# Patient Record
Sex: Male | Born: 1935 | Race: White | Hispanic: No | Smoking: Former smoker
Health system: Southern US, Community
[De-identification: ages and names within clinical notes are randomized; demographics above are authoritative.]

## PROBLEM LIST (undated history)

## (undated) DIAGNOSIS — Z89512 Acquired absence of left leg below knee: Secondary | ICD-10-CM

## (undated) DIAGNOSIS — N390 Urinary tract infection, site not specified: Principal | ICD-10-CM

## (undated) DIAGNOSIS — Z89511 Acquired absence of right leg below knee: Secondary | ICD-10-CM

## (undated) DIAGNOSIS — D649 Anemia, unspecified: Secondary | ICD-10-CM

## (undated) DIAGNOSIS — A419 Sepsis, unspecified organism: Secondary | ICD-10-CM

## (undated) DIAGNOSIS — J189 Pneumonia, unspecified organism: Secondary | ICD-10-CM

## (undated) DIAGNOSIS — E119 Type 2 diabetes mellitus without complications: Secondary | ICD-10-CM

## (undated) DIAGNOSIS — N184 Chronic kidney disease, stage 4 (severe): Secondary | ICD-10-CM

## (undated) DIAGNOSIS — E785 Hyperlipidemia, unspecified: Secondary | ICD-10-CM

## (undated) DIAGNOSIS — I10 Essential (primary) hypertension: Secondary | ICD-10-CM

## (undated) DIAGNOSIS — I472 Ventricular tachycardia: Secondary | ICD-10-CM

## (undated) DIAGNOSIS — N289 Disorder of kidney and ureter, unspecified: Secondary | ICD-10-CM

## (undated) DIAGNOSIS — J9601 Acute respiratory failure with hypoxia: Secondary | ICD-10-CM

## (undated) DIAGNOSIS — I4819 Other persistent atrial fibrillation: Secondary | ICD-10-CM

## (undated) DIAGNOSIS — I2510 Atherosclerotic heart disease of native coronary artery without angina pectoris: Secondary | ICD-10-CM

## (undated) DIAGNOSIS — N183 Chronic kidney disease, stage 3 (moderate): Secondary | ICD-10-CM

## (undated) DIAGNOSIS — N400 Enlarged prostate without lower urinary tract symptoms: Secondary | ICD-10-CM

## (undated) DIAGNOSIS — I5031 Acute diastolic (congestive) heart failure: Secondary | ICD-10-CM

## (undated) DIAGNOSIS — I480 Paroxysmal atrial fibrillation: Secondary | ICD-10-CM

## (undated) DIAGNOSIS — I4891 Unspecified atrial fibrillation: Secondary | ICD-10-CM

## (undated) HISTORY — PX: SHOULDER SURGERY: SHX246

## (undated) HISTORY — DX: Unspecified atrial fibrillation: I48.91

## (undated) HISTORY — DX: Acute diastolic (congestive) heart failure: I50.31

## (undated) HISTORY — DX: Acute respiratory failure with hypoxia: J96.01

## (undated) HISTORY — DX: Pneumonia, unspecified organism: J18.9

## (undated) HISTORY — DX: Ventricular tachycardia: I47.2

## (undated) HISTORY — DX: Paroxysmal atrial fibrillation: I48.0

## (undated) HISTORY — DX: Type 2 diabetes mellitus without complications: E11.9

## (undated) HISTORY — DX: Anemia, unspecified: D64.9

## (undated) HISTORY — PX: CORONARY ARTERY BYPASS GRAFT: SHX141

## (undated) HISTORY — DX: Acquired absence of right leg below knee: Z89.511

## (undated) HISTORY — DX: Sepsis, unspecified organism: A41.9

## (undated) HISTORY — DX: Urinary tract infection, site not specified: N39.0

## (undated) HISTORY — PX: BELOW KNEE LEG AMPUTATION: SUR23

## (undated) HISTORY — DX: Chronic kidney disease, stage 3 (moderate): N18.3

## (undated) HISTORY — DX: Chronic kidney disease, stage 4 (severe): N18.4

## (undated) HISTORY — PX: PROSTATE SURGERY: SHX751

## (undated) HISTORY — DX: Hyperlipidemia, unspecified: E78.5

## (undated) HISTORY — DX: Acquired absence of left leg below knee: Z89.512

## (undated) HISTORY — DX: Other persistent atrial fibrillation: I48.19

---

## 2015-11-30 DIAGNOSIS — Z89619 Acquired absence of unspecified leg above knee: Secondary | ICD-10-CM | POA: Insufficient documentation

## 2016-07-26 ENCOUNTER — Observation Stay (HOSPITAL_COMMUNITY): Payer: Medicare Other

## 2016-07-26 ENCOUNTER — Observation Stay (HOSPITAL_COMMUNITY)
Admission: EM | Admit: 2016-07-26 | Discharge: 2016-07-28 | Disposition: A | Discharge status: Home / Self Care | Payer: Medicare Other | Attending: Internal Medicine | Admitting: Internal Medicine

## 2016-07-26 ENCOUNTER — Encounter (HOSPITAL_COMMUNITY): Payer: Self-pay | Admitting: Emergency Medicine

## 2016-07-26 DIAGNOSIS — Z794 Long term (current) use of insulin: Secondary | ICD-10-CM | POA: Insufficient documentation

## 2016-07-26 DIAGNOSIS — Z89511 Acquired absence of right leg below knee: Secondary | ICD-10-CM

## 2016-07-26 DIAGNOSIS — E119 Type 2 diabetes mellitus without complications: Secondary | ICD-10-CM | POA: Diagnosis not present

## 2016-07-26 DIAGNOSIS — R0602 Shortness of breath: Secondary | ICD-10-CM

## 2016-07-26 DIAGNOSIS — Z89512 Acquired absence of left leg below knee: Secondary | ICD-10-CM | POA: Insufficient documentation

## 2016-07-26 DIAGNOSIS — R140 Abdominal distension (gaseous): Secondary | ICD-10-CM | POA: Diagnosis not present

## 2016-07-26 DIAGNOSIS — Z951 Presence of aortocoronary bypass graft: Secondary | ICD-10-CM | POA: Insufficient documentation

## 2016-07-26 DIAGNOSIS — I129 Hypertensive chronic kidney disease with stage 1 through stage 4 chronic kidney disease, or unspecified chronic kidney disease: Secondary | ICD-10-CM | POA: Insufficient documentation

## 2016-07-26 DIAGNOSIS — D649 Anemia, unspecified: Secondary | ICD-10-CM

## 2016-07-26 DIAGNOSIS — Z66 Do not resuscitate: Secondary | ICD-10-CM | POA: Diagnosis not present

## 2016-07-26 DIAGNOSIS — D631 Anemia in chronic kidney disease: Secondary | ICD-10-CM | POA: Insufficient documentation

## 2016-07-26 DIAGNOSIS — N400 Enlarged prostate without lower urinary tract symptoms: Secondary | ICD-10-CM | POA: Diagnosis not present

## 2016-07-26 DIAGNOSIS — N183 Chronic kidney disease, stage 3 unspecified: Secondary | ICD-10-CM

## 2016-07-26 DIAGNOSIS — E1122 Type 2 diabetes mellitus with diabetic chronic kidney disease: Secondary | ICD-10-CM | POA: Insufficient documentation

## 2016-07-26 DIAGNOSIS — I480 Paroxysmal atrial fibrillation: Secondary | ICD-10-CM | POA: Diagnosis not present

## 2016-07-26 DIAGNOSIS — I10 Essential (primary) hypertension: Secondary | ICD-10-CM

## 2016-07-26 DIAGNOSIS — I2510 Atherosclerotic heart disease of native coronary artery without angina pectoris: Secondary | ICD-10-CM

## 2016-07-26 DIAGNOSIS — N184 Chronic kidney disease, stage 4 (severe): Secondary | ICD-10-CM

## 2016-07-26 DIAGNOSIS — Z7982 Long term (current) use of aspirin: Secondary | ICD-10-CM | POA: Diagnosis not present

## 2016-07-26 DIAGNOSIS — D638 Anemia in other chronic diseases classified elsewhere: Secondary | ICD-10-CM

## 2016-07-26 DIAGNOSIS — R001 Bradycardia, unspecified: Secondary | ICD-10-CM | POA: Diagnosis not present

## 2016-07-26 DIAGNOSIS — I4891 Unspecified atrial fibrillation: Secondary | ICD-10-CM

## 2016-07-26 HISTORY — DX: Anemia, unspecified: D64.9

## 2016-07-26 HISTORY — DX: Acquired absence of right leg below knee: Z89.511

## 2016-07-26 HISTORY — DX: Acquired absence of left leg below knee: Z89.512

## 2016-07-26 HISTORY — DX: Essential (primary) hypertension: I10

## 2016-07-26 HISTORY — DX: Disorder of kidney and ureter, unspecified: N28.9

## 2016-07-26 HISTORY — DX: Type 2 diabetes mellitus without complications: E11.9

## 2016-07-26 HISTORY — DX: Atherosclerotic heart disease of native coronary artery without angina pectoris: I25.10

## 2016-07-26 HISTORY — DX: Benign prostatic hyperplasia without lower urinary tract symptoms: N40.0

## 2016-07-26 HISTORY — DX: Chronic kidney disease, stage 3 unspecified: N18.30

## 2016-07-26 LAB — CBC
HEMATOCRIT: 25.3 % — AB (ref 39.0–52.0)
HEMOGLOBIN: 8.6 g/dL — AB (ref 13.0–17.0)
MCH: 31.4 pg (ref 26.0–34.0)
MCHC: 34 g/dL (ref 30.0–36.0)
MCV: 92.3 fL (ref 78.0–100.0)
Platelets: 149 10*3/uL — ABNORMAL LOW (ref 150–400)
RBC: 2.74 MIL/uL — ABNORMAL LOW (ref 4.22–5.81)
RDW: 15.2 % (ref 11.5–15.5)
WBC: 6.2 10*3/uL (ref 4.0–10.5)

## 2016-07-26 LAB — COMPREHENSIVE METABOLIC PANEL
ALBUMIN: 3.8 g/dL (ref 3.5–5.0)
ALK PHOS: 98 U/L (ref 38–126)
ALT: 22 U/L (ref 17–63)
AST: 17 U/L (ref 15–41)
Anion gap: 8 (ref 5–15)
BILIRUBIN TOTAL: 0.6 mg/dL (ref 0.3–1.2)
BUN: 78 mg/dL — ABNORMAL HIGH (ref 6–20)
CALCIUM: 8.7 mg/dL — AB (ref 8.9–10.3)
CO2: 21 mmol/L — ABNORMAL LOW (ref 22–32)
Chloride: 106 mmol/L (ref 101–111)
Creatinine, Ser: 2.55 mg/dL — ABNORMAL HIGH (ref 0.61–1.24)
GFR calc Af Amer: 25 mL/min — ABNORMAL LOW (ref >60)
GFR, EST NON AFRICAN AMERICAN: 21 mL/min — AB (ref >60)
GLUCOSE: 189 mg/dL — AB (ref 65–99)
POTASSIUM: 4.4 mmol/L (ref 3.5–5.1)
Sodium: 135 mmol/L (ref 135–145)
TOTAL PROTEIN: 6.6 g/dL (ref 6.5–8.1)

## 2016-07-26 LAB — RETICULOCYTES
RBC.: 2.74 MIL/uL — ABNORMAL LOW (ref 4.22–5.81)
RETIC COUNT ABSOLUTE: 74 10*3/uL (ref 19.0–186.0)
Retic Ct Pct: 2.7 % (ref 0.4–3.1)

## 2016-07-26 LAB — POC OCCULT BLOOD, ED: FECAL OCCULT BLD: NEGATIVE

## 2016-07-26 LAB — ABO/RH: ABO/RH(D): O POS

## 2016-07-26 LAB — CBG MONITORING, ED: Glucose-Capillary: 171 mg/dL — ABNORMAL HIGH (ref 65–99)

## 2016-07-26 LAB — PREPARE RBC (CROSSMATCH)

## 2016-07-26 MED ORDER — INSULIN ASPART 100 UNIT/ML ~~LOC~~ SOLN
0.0000 [IU] | Freq: Three times a day (TID) | SUBCUTANEOUS | Status: DC
  Administered 2016-07-27: 2 [IU] via SUBCUTANEOUS
  Administered 2016-07-27: 7 [IU] via SUBCUTANEOUS
  Administered 2016-07-28: 2 [IU] via SUBCUTANEOUS
  Administered 2016-07-28: 1 [IU] via SUBCUTANEOUS

## 2016-07-26 MED ORDER — MINOXIDIL 2.5 MG PO TABS
2.5000 mg | ORAL_TABLET | Freq: Two times a day (BID) | ORAL | Status: DC
  Administered 2016-07-26 – 2016-07-28 (×4): 2.5 mg via ORAL
  Filled 2016-07-26 (×4): qty 1

## 2016-07-26 MED ORDER — DIPHENHYDRAMINE HCL 25 MG PO CAPS
25.0000 mg | ORAL_CAPSULE | Freq: Once | ORAL | Status: AC
  Administered 2016-07-26: 25 mg via ORAL
  Filled 2016-07-26: qty 1

## 2016-07-26 MED ORDER — PANTOPRAZOLE SODIUM 40 MG PO TBEC
40.0000 mg | DELAYED_RELEASE_TABLET | Freq: Every day | ORAL | Status: DC
  Administered 2016-07-26 – 2016-07-28 (×3): 40 mg via ORAL
  Filled 2016-07-26 (×3): qty 1

## 2016-07-26 MED ORDER — ONDANSETRON HCL 4 MG/2ML IJ SOLN: 4.0000 mg | Freq: Four times a day (QID) | INTRAMUSCULAR | Status: DC | PRN

## 2016-07-26 MED ORDER — INSULIN DETEMIR 100 UNIT/ML ~~LOC~~ SOLN
15.0000 [IU] | Freq: Two times a day (BID) | SUBCUTANEOUS | Status: DC
  Administered 2016-07-27 – 2016-07-28 (×3): 15 [IU] via SUBCUTANEOUS
  Filled 2016-07-26 (×5): qty 0.15

## 2016-07-26 MED ORDER — ALLOPURINOL 300 MG PO TABS
300.0000 mg | ORAL_TABLET | Freq: Every day | ORAL | Status: DC
  Administered 2016-07-27 – 2016-07-28 (×2): 300 mg via ORAL
  Filled 2016-07-26 (×2): qty 1

## 2016-07-26 MED ORDER — POLYETHYLENE GLYCOL 3350 17 G PO PACK: 17.0000 g | PACK | Freq: Every day | ORAL | Status: DC | PRN

## 2016-07-26 MED ORDER — SODIUM CHLORIDE 0.9 % IV SOLN: 10.0000 mL/h | Freq: Once | INTRAVENOUS | Status: AC

## 2016-07-26 MED ORDER — METOPROLOL TARTRATE 25 MG PO TABS
50.0000 mg | ORAL_TABLET | Freq: Two times a day (BID) | ORAL | Status: DC
  Administered 2016-07-26: 50 mg via ORAL
  Filled 2016-07-26: qty 2

## 2016-07-26 MED ORDER — ASPIRIN EC 81 MG PO TBEC
81.0000 mg | DELAYED_RELEASE_TABLET | Freq: Every day | ORAL | Status: DC
  Administered 2016-07-27 – 2016-07-28 (×2): 81 mg via ORAL
  Filled 2016-07-26 (×2): qty 1

## 2016-07-26 MED ORDER — ACETAMINOPHEN 325 MG PO TABS
650.0000 mg | ORAL_TABLET | Freq: Once | ORAL | Status: AC
Start: 2016-07-26 — End: 2016-07-26
  Filled 2016-07-26: qty 2

## 2016-07-26 MED ORDER — IPRATROPIUM-ALBUTEROL 0.5-2.5 (3) MG/3ML IN SOLN: 3.0000 mL | RESPIRATORY_TRACT | Status: DC | PRN

## 2016-07-26 MED ORDER — ATORVASTATIN CALCIUM 10 MG PO TABS
20.0000 mg | ORAL_TABLET | Freq: Every day | ORAL | Status: DC
  Administered 2016-07-26 – 2016-07-27 (×2): 20 mg via ORAL
  Filled 2016-07-26 (×2): qty 2

## 2016-07-26 MED ORDER — HYDRALAZINE HCL 20 MG/ML IJ SOLN
20.0000 mg | Freq: Four times a day (QID) | INTRAMUSCULAR | Status: DC | PRN
  Administered 2016-07-28: 20 mg via INTRAVENOUS
  Filled 2016-07-26: qty 1

## 2016-07-26 MED ORDER — ACETAMINOPHEN 650 MG RE SUPP: 650.0000 mg | Freq: Four times a day (QID) | RECTAL | Status: DC | PRN

## 2016-07-26 MED ORDER — SODIUM CHLORIDE 0.9% FLUSH
3.0000 mL | Freq: Two times a day (BID) | INTRAVENOUS | Status: DC
  Administered 2016-07-26 – 2016-07-27 (×4): 3 mL via INTRAVENOUS

## 2016-07-26 MED ORDER — FUROSEMIDE 10 MG/ML IJ SOLN
20.0000 mg | Freq: Once | INTRAMUSCULAR | Status: AC
  Administered 2016-07-26: 20 mg via INTRAVENOUS
  Filled 2016-07-26: qty 2

## 2016-07-26 MED ORDER — DOXYCYCLINE HYCLATE 100 MG PO TABS
100.0000 mg | ORAL_TABLET | Freq: Two times a day (BID) | ORAL | Status: DC
  Administered 2016-07-26: 100 mg via ORAL
  Filled 2016-07-26: qty 1

## 2016-07-26 MED ORDER — ONDANSETRON HCL 4 MG PO TABS: 4.0000 mg | ORAL_TABLET | Freq: Four times a day (QID) | ORAL | Status: DC | PRN

## 2016-07-26 MED ORDER — AMLODIPINE BESYLATE 5 MG PO TABS
10.0000 mg | ORAL_TABLET | Freq: Every day | ORAL | Status: DC
  Administered 2016-07-26 – 2016-07-28 (×3): 10 mg via ORAL
  Filled 2016-07-26 (×3): qty 2

## 2016-07-26 MED ORDER — SIMVASTATIN 40 MG PO TABS: 40.0000 mg | ORAL_TABLET | Freq: Every evening | ORAL | Status: DC

## 2016-07-26 MED ORDER — ACETAMINOPHEN 325 MG PO TABS
650.0000 mg | ORAL_TABLET | Freq: Four times a day (QID) | ORAL | Status: DC | PRN
  Administered 2016-07-26: 650 mg via ORAL

## 2016-07-26 MED ORDER — SODIUM CHLORIDE 0.9 % IV SOLN
Freq: Once | INTRAVENOUS | Status: AC
  Administered 2016-07-27: 01:00:00 via INTRAVENOUS

## 2016-07-26 NOTE — ED Provider Notes (Signed)
[facility]ENCY DEPT Provider Note   CSN: [id number] Arrival date & time: 07/26/16  1723     History   Chief Complaint Chief Complaint  Patient presents with  . Abnormal Lab    HPI Hershel Geraldo is a 83 y.o. male.  Patient reports that he received a call from his PCP Dr. Leavy Cella today saying that his blood count was low and he needed to report to the ED for a blood transfusion.  Pt said that his blood was drawn yesterday.  His doctor checks his blood work every 3 months and he has never been told his blood was low.  He does not know numbers.  He reports 2-3 weeks of weakness. 1 week of abdominal bloating, nausea, decreased appetite, and diarrhea. He denies having any vomiting or frank blood in his stool. He reports having black stools for the past 4 years. He has not had a colonoscopy recently. His PMH includes CKD (not on dialysis), HTN, HLD, and gout. He has not been hospitalized in the past 5 years. Of note in 2001 and he had coronary bypass and prostate cancer. He is originally from [place] and recently moved to the area to live with his daughter.       Past Medical History:  Diagnosis Date  . BPH (benign prostatic hyperplasia)   . CAD (coronary artery disease)   . Diabetes mellitus without complication (HCC)   . Hypertension   . Kidney disease   . S/P bilateral BKA (below knee amputation) [facility])     Patient Active Problem List   Diagnosis Date Noted  . Bradycardia 07/27/2016  . Symptomatic anemia 07/26/2016  . CKD (chronic kidney disease), stage III 07/26/2016  . CAD (coronary artery disease) 07/26/2016  . Type 2 diabetes mellitus (HCC) 07/26/2016  . Essential hypertension 07/26/2016  . S/P bilateral below knee amputation (HCC) 07/26/2016    Past Surgical History:  Procedure Laterality Date  . BELOW KNEE LEG AMPUTATION Bilateral   . CORONARY ARTERY BYPASS GRAFT    . PROSTATE SURGERY    . SHOULDER SURGERY         Home Medications    Prior to Admission  medications   Medication Sig Start Date End Date Taking? Authorizing Provider  allopurinol (ZYLOPRIM) 300 MG tablet Take 300 mg by mouth daily.   Yes [provider]  amLODipine (NORVASC) 10 MG tablet Take 10 mg by mouth daily.   Yes [provider]  aspirin EC 81 MG tablet Take 81 mg by mouth daily.   Yes [provider]  doxycycline (DORYX) 100 MG EC tablet Take 100 mg by mouth 2 (two) times daily. For 7 Days. ABT Start Date 07/25/16 & End Date 08/01/16.   Yes [provider]  insulin aspart (NOVOLOG) 100 UNIT/ML injection Inject 8 Units into the skin daily.   Yes [provider]  insulin detemir (LEVEMIR) 100 UNIT/ML injection Inject 12-18 Units into the skin 2 (two) times daily. Inject 18 units in the morning and Inject 12 units in the evening.   Yes [provider]  linagliptin (TRADJENTA) 5 MG TABS tablet Take 5 mg by mouth daily.   Yes [provider]  lisinopril-hydrochlorothiazide (PRINZIDE,ZESTORETIC) 20-25 MG tablet Take 1 tablet by mouth 2 (two) times daily.   Yes [provider]  metoprolol tartrate (LOPRESSOR) 50 MG tablet Take 50 mg by mouth 2 (two) times daily.   Yes [provider]  minoxidil (LONITEN) 2.5 MG tablet Take 2.5 mg by mouth  2 (two) times daily.   Yes [provider]  polyethylene glycol (MIRALAX / GLYCOLAX) packet Take 17 g by mouth daily as needed for moderate constipation.   Yes [provider]  simvastatin (ZOCOR) 40 MG tablet Take 40 mg by mouth every evening.   Yes [provider]    Family History Family History  Problem Relation Age of Onset  . Heart attack Father     Social History Social History  Substance Use Topics  . Smoking status: Never Smoker  . Smokeless tobacco: Never Used  . Alcohol use No     Allergies   Patient has no known allergies.   Review of Systems Review of Systems  Constitutional: Positive for activity change, appetite  change and fatigue. Negative for chills and fever.  HENT: Negative.   Eyes: Negative.   Respiratory: Positive for shortness of breath. Negative for chest tightness.   Cardiovascular: Negative for chest pain and palpitations.  Gastrointestinal: Positive for abdominal distention, diarrhea and nausea. Negative for blood in stool and vomiting.  Musculoskeletal: Negative.   Skin: Negative.   Neurological: Positive for weakness.  Psychiatric/Behavioral: Negative.   All other systems reviewed and are negative.    Physical Exam Updated Vital Signs BP (!) 154/55   Pulse (!) 44   Temp 97.7 F (36.5 C) (Oral)   Resp 16   Ht 5\' 6"  (1.676 m)   Wt 198 lb 15.8 oz (90.3 kg)   SpO2 94%   BMI 32.12 kg/m   Physical Exam  Constitutional: He is oriented to person, place, and time. He appears well-developed and well-nourished.  HENT:  Head: Normocephalic and atraumatic.  Right Ear: External ear normal.  Left Ear: External ear normal.  Nose: Nose normal.  Mouth/Throat: Oropharynx is clear and moist.  Eyes: Conjunctivae and EOM are normal. Pupils are equal, round, and reactive to light.  Neck: Normal range of motion. Neck supple.  Cardiovascular: Normal rate, regular rhythm, normal heart sounds and intact distal pulses.   Pulmonary/Chest: Effort normal and breath sounds normal.  Abdominal: Soft. Bowel sounds are normal.  Genitourinary: Rectal exam shows guaiac negative stool.  Musculoskeletal:  Bilateral bka  Neurological: He is alert and oriented to person, place, and time.  Skin: Skin is warm.  Psychiatric: He has a normal mood and affect. His behavior is normal.  Nursing note and vitals reviewed.    ED Treatments / Results  Labs (all labs ordered are listed, but only abnormal results are displayed) Labs Reviewed  COMPREHENSIVE METABOLIC PANEL - Abnormal; Notable for the following:       Result Value   CO2 21 (*)    Glucose, Bld 189 (*)    BUN 78 (*)    Creatinine, Ser 2.55 (*)      Calcium 8.7 (*)    GFR calc non Af Amer 21 (*)    GFR calc Af Amer 25 (*)    All other components within normal limits  CBC - Abnormal; Notable for the following:    RBC 2.74 (*)    Hemoglobin 8.6 (*)    HCT 25.3 (*)    Platelets 149 (*)    All other components within normal limits  TROPONIN I - Abnormal; Notable for the following:    Troponin I 0.45 (*)    All other components within normal limits  IRON AND TIBC - Abnormal; Notable for the following:    Iron 24 (*)    Saturation Ratios 9 (*)  All other components within normal limits  RETICULOCYTES - Abnormal; Notable for the following:    RBC. 2.74 (*)    All other components within normal limits  CBC - Abnormal; Notable for the following:    RBC 3.35 (*)    Hemoglobin 10.2 (*)    HCT 29.8 (*)    RDW 16.2 (*)    All other components within normal limits  BASIC METABOLIC PANEL - Abnormal; Notable for the following:    Sodium 133 (*)    CO2 20 (*)    Glucose, Bld 184 (*)    BUN 74 (*)    Creatinine, Ser 2.32 (*)    Calcium 8.5 (*)    GFR calc non Af Amer 24 (*)    GFR calc Af Amer 28 (*)    All other components within normal limits  TROPONIN I - Abnormal; Notable for the following:    Troponin I 0.27 (*)    All other components within normal limits  TROPONIN I - Abnormal; Notable for the following:    Troponin I 0.39 (*)    All other components within normal limits  GLUCOSE, CAPILLARY - Abnormal; Notable for the following:    Glucose-Capillary 160 (*)    All other components within normal limits  GLUCOSE, CAPILLARY - Abnormal; Notable for the following:    Glucose-Capillary 224 (*)    All other components within normal limits  CBG MONITORING, ED - Abnormal; Notable for the following:    Glucose-Capillary 171 (*)    All other components within normal limits  VITAMIN B12  FOLATE  FERRITIN  TSH  MAGNESIUM  CK TOTAL AND CKMB (NOT AT [facility])  OCCULT BLOOD X 1 CARD TO LAB, STOOL  TROPONIN I  TROPONIN I  POC  OCCULT BLOOD, ED  TYPE AND SCREEN  ABO/RH  PREPARE RBC (CROSSMATCH)  PREPARE RBC (CROSSMATCH)  PREPARE RBC (CROSSMATCH)    EKG  EKG Interpretation  Date/Time:  Saturday Jul 26 2016 21:15:10 EDT Ventricular Rate:  64 PR Interval:    QRS Duration: 118 QT Interval:  457 QTC Calculation: 472 R Axis:   87 Text Interpretation:  Sinus rhythm Short PR interval Nonspecific intraventricular conduction delay Abnormal T, consider ischemia, diffuse leads No previous tracing Confirmed by Gilda CreasePollina, Christopher J [id number]) on 07/27/2016 4:42:08 PM       Radiology Dg Chest 1 View  Result Date: 07/26/2016 CLINICAL DATA:  Anemia, weakness, shortness of breath EXAM: CHEST 1 VIEW COMPARISON:  None. FINDINGS: Cardiomegaly with pulmonary vascular congestion. No frank interstitial edema. Postsurgical changes related to prior CABG. Median sternotomy. IMPRESSION: No evidence of acute cardiopulmonary disease. Electronically Signed   By: Charline BillsSriyesh  Krishnan M.D.   On: 07/26/2016 22:00   Dg Abd 1 View  Result Date: 07/26/2016 CLINICAL DATA:  Anemia, weakness, shortness of breath EXAM: ABDOMEN - 1 VIEW COMPARISON:  None. FINDINGS: Nonobstructive bowel gas pattern. Degenerative changes of the lumbar spine. Brachytherapy seeds overlying the prostate. IMPRESSION: Unremarkable abdominal radiograph. Electronically Signed   By: Charline BillsSriyesh  Krishnan M.D.   On: 07/26/2016 22:02    Procedures Procedures (including critical care time)  Medications Ordered in ED Medications  pantoprazole (PROTONIX) EC tablet 40 mg (40 mg Oral Given 07/27/16 1202)  allopurinol (ZYLOPRIM) tablet 300 mg (300 mg Oral Given 07/27/16 1203)  amLODipine (NORVASC) tablet 10 mg (10 mg Oral Given 07/27/16 1203)  aspirin EC tablet 81 mg (81 mg Oral Given 07/27/16 1203)  insulin detemir (LEVEMIR) injection 15 Units (15 Units  Subcutaneous Given 07/27/16 1204)  minoxidil (LONITEN) tablet 2.5 mg (2.5 mg Oral Given 07/27/16 1203)  polyethylene glycol (MIRALAX /  GLYCOLAX) packet 17 g (not administered)  sodium chloride flush (NS) 0.9 % injection 3 mL (3 mLs Intravenous Given 07/27/16 1000)  acetaminophen (TYLENOL) tablet 650 mg (650 mg Oral Given 07/26/16 2315)    Or  acetaminophen (TYLENOL) suppository 650 mg ( Rectal See Alternative 07/26/16 2315)  ondansetron (ZOFRAN) tablet 4 mg (not administered)    Or  ondansetron (ZOFRAN) injection 4 mg (not administered)  insulin aspart (novoLOG) injection 0-9 Units (0 Units Subcutaneous Not Given 07/27/16 1200)  ipratropium-albuterol (DUONEB) 0.5-2.5 (3) MG/3ML nebulizer solution 3 mL (not administered)  atorvastatin (LIPITOR) tablet 20 mg (20 mg Oral Given 07/26/16 2316)  hydrALAZINE (APRESOLINE) injection 20 mg (not administered)  atropine 1 MG/10ML injection 0.5 mg (not administered)  0.9 %  sodium chloride infusion (not administered)  enoxaparin (LOVENOX) injection 30 mg (not administered)  0.9 %  sodium chloride infusion (0 mL/hr Intravenous Duplicate 07/26/16 2100)  acetaminophen (TYLENOL) tablet 650 mg (0 mg Oral Duplicate 07/26/16 2320)  diphenhydrAMINE (BENADRYL) capsule 25 mg (25 mg Oral Given 07/26/16 2315)  0.9 %  sodium chloride infusion ( Intravenous New Bag/Given 07/27/16 0032)  furosemide (LASIX) injection 20 mg (20 mg Intravenous Given 07/26/16 2316)     Initial Impression / Assessment and Plan / ED Course  I have reviewed the triage vital signs and the nursing notes.  Pertinent labs & imaging results that were available during my care of the patient were reviewed by me and considered in my medical decision making (see chart for details).    Anemia is likely due to CRI, but pt is symptomatic from anemia and has cad. Therefore, I ordered a unit of blood.  There are no old labs to compare and so It is unclear what his prior numbers were.  However, since his doctor told him to come to the Ed, I assume they have not been so low.  Pt d/w Dr. Montez Morita ([facility]) for admission.  Final Clinical  Impressions(s) / ED Diagnoses   Final diagnoses:  Symptomatic anemia  CRI (chronic renal insufficiency), stage 4 (severe) [place])    New Prescriptions Current Discharge Medication List       Jacalyn Lefevre, MD 07/27/16 1657

## 2016-07-26 NOTE — Progress Notes (Addendum)
Notified by tele that patient had a 2.29 pause. Pt asymptomatic, alert and oriented x4, sitting up in bed without complaints.  MD notified.

## 2016-07-26 NOTE — ED Triage Notes (Addendum)
Pt reports his doctor's office said his Hgb was low. Hx of kidney disease. Has had generalized weakness for the past week. No blood in stool. Pt also complains of abd bloating and nausea.

## 2016-07-26 NOTE — H&P (Signed)
History and Physical    Tony Mcclure ZOX:[id number] DOB: 06-29-1928 DOA: 07/26/2016  PCP: [name], MD   Patient coming from: Home  Chief Complaint: Advised to come to the ED by PCP for abnormal labs  HPI: Tony Mcclure is a 83 y.o. gentleman with a history of CAD S/P CABG, HTN, DM, CKD 3, and Bilateral BKA (presumably related to complicated diabetic foot wounds; patient is not sure) who presents to the ED because he was advised to come in by PCP for abnormal labs.  Patient has a PCP in the [facility] system.  He does not have any reference labs with him, but reportedly he has new anemia.  He also has evidence of renal insufficiency, which I suspect is chronic.  He has had increased fatigue and DOE for several days.   No chest pain.  No syncope.  He also reports increased abdominal distention, but no abdominal pain, nausea, and decreased appetite.  He denies gross blood in urine or stool.  He says that stools have been normal in color but he has intermittent diarrhea.  He has never had a colonoscopy.  ED Course: Fecal occult test negative in the ED.  Hgb 8.6.  Platelet count 149.  Bicarb 21.  BUN 78, Creatinine 2.55.    Anemia panel ordered.  Transfusion ordered.  Patient declining CT at this time.  Hospitalist asked to place in observation.  Review of Systems: As per HPI otherwise 10 systems reviewed and negative.   Past Medical History:  Diagnosis Date  . BPH (benign prostatic hyperplasia)   . CAD (coronary artery disease)   . Diabetes mellitus without complication (HCC)   . Hypertension   . Kidney disease   . S/P bilateral BKA (below knee amputation) [facility])     Past Surgical History:  Procedure Laterality Date  . BELOW KNEE LEG AMPUTATION Bilateral   . CORONARY ARTERY BYPASS GRAFT    . PROSTATE SURGERY    . SHOULDER SURGERY       reports that he has never smoked. He has never used smokeless tobacco. He reports that he does not drink alcohol or use drugs.  Relocated  here from [facility] 9 months ago.  He is a widower.  No Known Allergies  Family History  Problem Relation Age of Onset  . Heart attack Father      Prior to Admission medications   Medication Sig Start Date End Date Taking? Authorizing Provider  allopurinol (ZYLOPRIM) 300 MG tablet Take 300 mg by mouth daily.   Yes [provider]  amLODipine (NORVASC) 10 MG tablet Take 10 mg by mouth daily.   Yes [provider]  aspirin EC 81 MG tablet Take 81 mg by mouth daily.   Yes [provider]  doxycycline (DORYX) 100 MG EC tablet Take 100 mg by mouth 2 (two) times daily. For 7 Days. ABT Start Date 07/25/16 & End Date 08/01/16.   Yes [provider]  insulin aspart (NOVOLOG) 100 UNIT/ML injection Inject 8 Units into the skin daily.   Yes [provider]  insulin detemir (LEVEMIR) 100 UNIT/ML injection Inject 12-18 Units into the skin 2 (two) times daily. Inject 18 units in the morning and Inject 12 units in the evening.   Yes [provider]  linagliptin (TRADJENTA) 5 MG TABS tablet Take 5 mg by mouth daily.   Yes [provider]  lisinopril-hydrochlorothiazide (PRINZIDE,ZESTORETIC) 20-25 MG tablet Take 1 tablet by mouth 2 (two) times daily.   Yes  [provider]  metoprolol tartrate (LOPRESSOR) 50 MG tablet Take 50 mg by mouth 2 (two) times daily.   Yes [provider]  minoxidil (LONITEN) 2.5 MG tablet Take 2.5 mg by mouth 2 (two) times daily.   Yes [provider]  polyethylene glycol (MIRALAX / GLYCOLAX) packet Take 17 g by mouth daily as needed for moderate constipation.   Yes [provider]  simvastatin (ZOCOR) 40 MG tablet Take 40 mg by mouth every evening.   Yes [provider]    Physical Exam: Vitals:   07/26/16 1742 07/26/16 1948 07/26/16 2157  BP: (!) 162/58 (!) 175/61 (!) 159/56  Pulse: 62 68 65  Resp: 16 20 (!) 24  Temp: 98.2 F (36.8 C)    TempSrc: Oral    SpO2: 94% 95% 95%       Constitutional: NAD, calm, comfortable Vitals:   07/26/16 1742 07/26/16 1948 07/26/16 2157  BP: (!) 162/58 (!) 175/61 (!) 159/56  Pulse: 62 68 65  Resp: 16 20 (!) 24  Temp: 98.2 F (36.8 C)    TempSrc: Oral    SpO2: 94% 95% 95%   Eyes: PERRL, lids and conjunctivae normal ENMT: Mucous membranes are moist. Posterior pharynx clear of any exudate or lesions. Wearing upper and lower dentures. Neck: normal appearance, supple, no masses Respiratory: Bibasilar crackles.  Normal respiratory effort. No accessory muscle use.  Cardiovascular: Normal rate, regular rhythm, no murmurs / rubs / gallops. Bilateral BKA.  Wearing prostheses. GI: abdomen is protuberant, distended, tense but no painful.  No TTP.  Bowel sounds are present. Musculoskeletal:  No joint deformity in upper extremities. Bilateral BKA.  No contractures. Normal muscle tone.  Skin: no rashes, warm and dry Neurologic: No focal deficits. Psychiatric: Normal judgment and insight. Alert and oriented x 3. Normal mood.     Labs on Admission: I have personally reviewed following labs and imaging studies  CBC:  Recent Labs Lab 07/26/16 1922  WBC 6.2  HGB 8.6*  HCT 25.3*  MCV 92.3  PLT 149*   Basic Metabolic Panel:  Recent Labs Lab 07/26/16 1922  NA 135  K 4.4  CL 106  CO2 21*  GLUCOSE 189*  BUN 78*  CREATININE 2.55*  CALCIUM 8.7*   GFR: CrCl cannot be calculated (Unknown ideal weight.). Liver Function Tests:  Recent Labs Lab 07/26/16 1922  AST 17  ALT 22  ALKPHOS 98  BILITOT 0.6  PROT 6.6  ALBUMIN 3.8   CBG:  Recent Labs Lab 07/26/16 2157  GLUCAP 171*   Anemia Panel:  Recent Labs  07/26/16 1922  RETICCTPCT 2.7    Radiological Exams on Admission: Dg Chest 1 View  Result Date: 07/26/2016 CLINICAL DATA:  Anemia, weakness, shortness of breath EXAM: CHEST 1 VIEW COMPARISON:  None. FINDINGS: Cardiomegaly with pulmonary vascular congestion. No frank interstitial edema. Postsurgical  changes related to prior CABG. Median sternotomy. IMPRESSION: No evidence of acute cardiopulmonary disease. Electronically Signed   By: Charline Bills M.D.   On: 07/26/2016 22:00   Dg Abd 1 View  Result Date: 07/26/2016 CLINICAL DATA:  Anemia, weakness, shortness of breath EXAM: ABDOMEN - 1 VIEW COMPARISON:  None. FINDINGS: Nonobstructive bowel gas pattern. Degenerative changes of the lumbar spine. Brachytherapy seeds overlying the prostate. IMPRESSION: Unremarkable abdominal radiograph. Electronically Signed   By: Charline Bills M.D.   On: 07/26/2016 22:02    EKG: Independently reviewed. NSR.  Assessment/Plan Principal Problem:   Symptomatic anemia Active Problems:   CKD (chronic kidney  disease), stage III   CAD (coronary artery disease)   Type 2 diabetes mellitus (HCC)   Essential hypertension   S/P bilateral below knee amputation (HCC)      Symptomatic anemia, likely multifactorial (chronic disease, iron deficiency). --Transfuse two units of PRBCs.  Lasix after the first unit --Anemia panel pending --Repeat stool heme occult test --patient has abdominal distention but he not interested in CT of the abdomen/pelvis at this time  CKD 3, baseline unclear without additional data --Repeat BMP in AM after volume resuscitation via blood transfusion --HCTZ and lisinopril for now  HTN --Amlodipine, minoxidil, metoprolol  History of CAD S/P CABG --Baby aspirin --Statine  Diabetes --Levemir 15 units subQ BID --Hold tradjenta --Sensitive SSI AC due to renal insufficiency  Apparently doxycycline just prescribed by PCP on yesterday; will hold for now.   DVT prophylaxis: Low risk, obs status.   Code Status: DNR Family Communication: Daughter present in the ED at time of admission. Disposition Plan: Expect he will go home at discharge. Consults called: NONE Admission status: Place in observation with telemetry monitoring.   TIME SPENT: 60 minutes   Jerene Bearsarter,Sang Blount  [place] MD [facility] Pager [phone]  If 7PM-7AM, please contact night-coverage [email or web address] Password [email or web address]  07/26/2016, 10:00 PM

## 2016-07-26 NOTE — Progress Notes (Signed)
The order for simvastatin(Zocor) was changed to an equivalent dose of atorvastatin(Lipitor) due to the potential drug interaction with norvasc  When taken in combination with medications that inhibit its metabolism, simvastatin can accumulate which increases the risk of liver toxicity, myopathy, or rhabdomyolysis.  Simvastatin dose should not exceed 10mg /day in patients taking verapamil, diltiazem, fibrates, or niacin >or= 1g/day.   Simvastatin dose should not exceed 20mg /day in patients taking amlodipine, ranolazine or amiodarone.   Please consider this potential interaction at discharge.  [name], Tony Mcclure R 07/26/2016 10:25 PM

## 2016-07-27 ENCOUNTER — Observation Stay (HOSPITAL_BASED_OUTPATIENT_CLINIC_OR_DEPARTMENT_OTHER): Payer: Medicare Other

## 2016-07-27 DIAGNOSIS — I10 Essential (primary) hypertension: Secondary | ICD-10-CM | POA: Diagnosis not present

## 2016-07-27 DIAGNOSIS — I480 Paroxysmal atrial fibrillation: Secondary | ICD-10-CM | POA: Diagnosis not present

## 2016-07-27 DIAGNOSIS — D649 Anemia, unspecified: Secondary | ICD-10-CM | POA: Diagnosis not present

## 2016-07-27 DIAGNOSIS — Z794 Long term (current) use of insulin: Secondary | ICD-10-CM | POA: Diagnosis not present

## 2016-07-27 DIAGNOSIS — R072 Precordial pain: Secondary | ICD-10-CM

## 2016-07-27 DIAGNOSIS — I129 Hypertensive chronic kidney disease with stage 1 through stage 4 chronic kidney disease, or unspecified chronic kidney disease: Secondary | ICD-10-CM | POA: Diagnosis not present

## 2016-07-27 DIAGNOSIS — Z89512 Acquired absence of left leg below knee: Secondary | ICD-10-CM

## 2016-07-27 DIAGNOSIS — N183 Chronic kidney disease, stage 3 (moderate): Secondary | ICD-10-CM

## 2016-07-27 DIAGNOSIS — N184 Chronic kidney disease, stage 4 (severe): Secondary | ICD-10-CM | POA: Diagnosis not present

## 2016-07-27 DIAGNOSIS — R001 Bradycardia, unspecified: Secondary | ICD-10-CM

## 2016-07-27 DIAGNOSIS — E1122 Type 2 diabetes mellitus with diabetic chronic kidney disease: Secondary | ICD-10-CM

## 2016-07-27 DIAGNOSIS — D631 Anemia in chronic kidney disease: Secondary | ICD-10-CM | POA: Diagnosis not present

## 2016-07-27 DIAGNOSIS — I4891 Unspecified atrial fibrillation: Secondary | ICD-10-CM | POA: Diagnosis not present

## 2016-07-27 DIAGNOSIS — Z89511 Acquired absence of right leg below knee: Secondary | ICD-10-CM

## 2016-07-27 LAB — CBC
HCT: 29.8 % — ABNORMAL LOW (ref 39.0–52.0)
HEMOGLOBIN: 10.2 g/dL — AB (ref 13.0–17.0)
MCH: 30.4 pg (ref 26.0–34.0)
MCHC: 34.2 g/dL (ref 30.0–36.0)
MCV: 89 fL (ref 78.0–100.0)
Platelets: 152 10*3/uL (ref 150–400)
RBC: 3.35 MIL/uL — ABNORMAL LOW (ref 4.22–5.81)
RDW: 16.2 % — ABNORMAL HIGH (ref 11.5–15.5)
WBC: 5.2 10*3/uL (ref 4.0–10.5)

## 2016-07-27 LAB — ECHOCARDIOGRAM COMPLETE
HEIGHTINCHES: 66 in
Weight: 3183.8 oz

## 2016-07-27 LAB — PREPARE RBC (CROSSMATCH)

## 2016-07-27 LAB — BASIC METABOLIC PANEL
ANION GAP: 6 (ref 5–15)
BUN: 74 mg/dL — ABNORMAL HIGH (ref 6–20)
CALCIUM: 8.5 mg/dL — AB (ref 8.9–10.3)
CHLORIDE: 107 mmol/L (ref 101–111)
CO2: 20 mmol/L — ABNORMAL LOW (ref 22–32)
Creatinine, Ser: 2.32 mg/dL — ABNORMAL HIGH (ref 0.61–1.24)
GFR, EST AFRICAN AMERICAN: 28 mL/min — AB (ref >60)
GFR, EST NON AFRICAN AMERICAN: 24 mL/min — AB (ref >60)
Glucose, Bld: 184 mg/dL — ABNORMAL HIGH (ref 65–99)
Potassium: 4 mmol/L (ref 3.5–5.1)
SODIUM: 133 mmol/L — AB (ref 135–145)

## 2016-07-27 LAB — FOLATE: Folate: 21.5 ng/mL (ref >5.9)

## 2016-07-27 LAB — MAGNESIUM: Magnesium: 2.2 mg/dL (ref 1.7–2.4)

## 2016-07-27 LAB — FERRITIN: FERRITIN: 222 ng/mL (ref 24–336)

## 2016-07-27 LAB — GLUCOSE, CAPILLARY
Glucose-Capillary: 160 mg/dL — ABNORMAL HIGH (ref 65–99)
Glucose-Capillary: 224 mg/dL — ABNORMAL HIGH (ref 65–99)
Glucose-Capillary: 301 mg/dL — ABNORMAL HIGH (ref 65–99)
Glucose-Capillary: 167 mg/dL — ABNORMAL HIGH (ref 65–99)

## 2016-07-27 LAB — TSH: TSH: 2.957 u[IU]/mL (ref 0.350–4.500)

## 2016-07-27 LAB — TROPONIN I
TROPONIN I: 0.45 ng/mL — AB (ref <0.03)
TROPONIN I: 0.27 ng/mL — AB (ref <0.03)
TROPONIN I: 0.28 ng/mL — AB (ref <0.03)
Troponin I: 0.39 ng/mL (ref <0.03)

## 2016-07-27 LAB — IRON AND TIBC
IRON: 24 ug/dL — AB (ref 45–182)
Saturation Ratios: 9 % — ABNORMAL LOW (ref 17.9–39.5)
TIBC: 262 ug/dL (ref 250–450)
UIBC: 238 ug/dL

## 2016-07-27 LAB — CK TOTAL AND CKMB (NOT AT ARMC)
CK, MB: 2.7 ng/mL (ref 0.5–5.0)
RELATIVE INDEX: INVALID (ref 0.0–2.5)
Total CK: 60 U/L (ref 49–397)

## 2016-07-27 LAB — VITAMIN B12: Vitamin B-12: 285 pg/mL (ref 180–914)

## 2016-07-27 MED ORDER — SODIUM CHLORIDE 0.9 % IV SOLN: Freq: Once | INTRAVENOUS | Status: DC

## 2016-07-27 MED ORDER — ATROPINE SULFATE 1 MG/10ML IJ SOSY: 0.5000 mg | PREFILLED_SYRINGE | INTRAMUSCULAR | Status: DC | PRN

## 2016-07-27 MED ORDER — ENOXAPARIN SODIUM 30 MG/0.3ML ~~LOC~~ SOLN
30.0000 mg | SUBCUTANEOUS | Status: DC
  Administered 2016-07-27: 30 mg via SUBCUTANEOUS
  Filled 2016-07-27: qty 0.3

## 2016-07-27 NOTE — Progress Notes (Signed)
Patient admitted to room 1503 from ED. Patient is alert and oriented x4. Pt lives with daughter, uses wheelchair and transfers with one assist. Pt has bilateral BKA with prosthesis. Pt has right hand SL. Pt is short of breath, states he has been for a couple of days. Abd is round, taunt, last BM 2 days ago. Daughter with patient at bedside, but will be going home. She can be reached at [phone]. Pt oriented to room, call bell, bed use, etc. Will c/t monitor.

## 2016-07-27 NOTE — Progress Notes (Signed)
EKG completed, results in chart. Notified Dr. Selena BattenKim that is complete.

## 2016-07-27 NOTE — Progress Notes (Signed)
CRITICAL VALUE ALERT  Critical value received: Troponin 0.45  Date of notification:  07/27/16  Time of notification:  0030  Critical value read back:yes  Nurse who received alert:  Dwana CurdVera, RN  MD notified (1st page):  Dr. Selena BattenKim  Time of first page:  0035  MD notified (2nd page):  Time of second page:  Responding MD:  Dr. Selena BattenKim  Time MD responded:  [phone]

## 2016-07-27 NOTE — Progress Notes (Signed)
Patient heart rate dropping to 39, AFIB. Pt is asymptomatic. RN to room and patient wakes easily, is alert and oriented x4 and no symptoms. Manually heart rate is irregular and rate up in 60s. MD on call paged to notify.

## 2016-07-27 NOTE — Progress Notes (Signed)
  Echocardiogram 2D Echocardiogram has been performed.  Tony Mcclure, Damin Salido R 07/27/2016, 11:59 AM

## 2016-07-27 NOTE — Progress Notes (Signed)
Dr. Selena BattenKim called back regarding patient heart rate. Advised that patient AFIB, heart rate in 30s at times with some pauses. Advised that pt is asymptomatic. Dr. Selena BattenKim states that tele can lower settings to 40 and he will have cardiology see patient today. Will continue to monitor.

## 2016-07-27 NOTE — Care Management Obs Status (Signed)
MEDICARE OBSERVATION STATUS NOTIFICATION   Patient Details  Name: Tony Mcclure MRN: [id number] Date of Birth: [birth year]   Medicare Observation Status Notification Given:  Yes    Tony Mcclure, Tony Polcyn Ellen, RN 07/27/2016, 4:57 PM

## 2016-07-27 NOTE — Progress Notes (Signed)
Callback from Dr. Selena BattenKim regarding patient. Gave order to obtain 12 lead EKG and place atropine at bedside. Will continue to monitor.

## 2016-07-27 NOTE — Progress Notes (Signed)
Call from Dr. Montez Moritaarter regarding PRBCs. Patient is to have a second unit. Will give second unit as soon as ready. Will continue to monitor.

## 2016-07-27 NOTE — Progress Notes (Signed)
PROGRESS NOTE    Tony Mcclure   WUJ:[id number]  DOB: 03-07-1929  DOA: 07/26/2016 PCP: [name], MD   Brief Narrative:  Tony Mcclure is a 83 y.o. gentleman with a history of CAD S/P CABG, HTN, DM, CKD 3, and Bilateral BKA (presumably related to complicated diabetic foot wounds; patient is not sure) who presents to the ED because he was advised to come in by PCP for abnormal labs. He was found to be anemic and admitted to having DOE and severe fatigue. He was admitted for a blood transfusion. HR noted to be dropping down to the 30s on telemetry with 1 episode of a 2 second pause.   Subjective: S/p transfusion this AM but continues to feel fatigued. No new complaints. No increasing dyspnea after the blood transfusion. No cough or chest pain. No complaints of chest fluttering. Has never been told that he has a slow heart rhythm.  Assessment & Plan:   Principal Problem:   Symptomatic anemia - has been transfused 2 U PRBC - anemia panel reveals AOCD - stool hemoccult neg  Active Problems:   Bradycardia - suspect this is the actual cause of his DOE and fatigue- HR is dropping into 30s - hold Lopressor - ECHO reveals LVH    CKD (chronic kidney disease), stage III-IV - no baseline Cr to compare with- patient states that he has been told that he has "weak" kidneys  Abdominal distension - xray unrevealing- he states abdominal distension is chronic    CAD (coronary artery disease) s/p CABG - cont aspirin    Type 2 diabetes mellitus   - cont Levemir and ISS    Essential hypertension - holding Lopressor - cotn Norvasc and Minoxidil    S/P bilateral below knee amputation (HCC)   DVT prophylaxis: Lovenox Code Status: DNR Family Communication:  Disposition Plan: home when stable Consultants:    Procedures:   2 D ECHO Antimicrobials:  Anti-infectives    Start     Dose/Rate Route Frequency Ordered Stop   07/26/16 2200  doxycycline (VIBRA-TABS) tablet 100 mg   Status:  Discontinued    Comments:  For 7 Days. ABT Start Date 07/25/16 & End Date 08/01/16.     100 mg Oral 2 times daily 07/26/16 2145 07/26/16 2332       Objective: Vitals:   07/27/16 0354 07/27/16 0507 07/27/16 0524 07/27/16 0757  BP: (!) 144/54 (!) 143/47 (!) 135/48 (!) 154/55  Pulse: (!) 44 (!) 50 (!) 32 (!) 44  Resp:  20 18 16   Temp: 97.6 F (36.4 C) 97.6 F (36.4 C) 98.2 F (36.8 C) 97.7 F (36.5 C)  TempSrc: Oral Oral Oral Oral  SpO2: 93% 91% 92% 94%  Weight:      Height:        Intake/Output Summary (Last 24 hours) at 07/27/16 1412 Last data filed at 07/27/16 1102  Gross per 24 hour  Intake           994.17 ml  Output             1500 ml  Net          -505.83 ml   Filed Weights   07/26/16 2233  Weight: 90.3 kg (198 lb 15.8 oz)    Examination: General exam: Appears comfortable  HEENT: PERRLA, oral mucosa moist, no sclera icterus or thrush Respiratory system: Clear to auscultation. Respiratory effort normal. Cardiovascular system: S1 & S2 heard, RRR.  No murmurs - bradycardic Gastrointestinal system: Abdomen  soft, non-tender, nondistended. Normal bowel sound. No organomegaly Central nervous system: Alert and oriented. No focal neurological deficits. Extremities: No cyanosis, clubbing or edema- b/l BKA Skin: No rashes or ulcers Psychiatry:  Mood & affect appropriate.     Data Reviewed: I have personally reviewed following labs and imaging studies  CBC:  Recent Labs Lab 07/26/16 1922 07/27/16 0921  WBC 6.2 5.2  HGB 8.6* 10.2*  HCT 25.3* 29.8*  MCV 92.3 89.0  PLT 149* 152   Basic Metabolic Panel:  Recent Labs Lab 07/26/16 1922 07/27/16 0921  NA 135 133*  K 4.4 4.0  CL 106 107  CO2 21* 20*  GLUCOSE 189* 184*  BUN 78* 74*  CREATININE 2.55* 2.32*  CALCIUM 8.7* 8.5*  MG  --  2.2   GFR: Estimated Creatinine Clearance: 24.1 mL/min (A) (by C-G formula based on SCr of 2.32 mg/dL (H)). Liver Function Tests:  Recent Labs Lab 07/26/16 1922    AST 17  ALT 22  ALKPHOS 98  BILITOT 0.6  PROT 6.6  ALBUMIN 3.8   No results for input(s): LIPASE, AMYLASE in the last 168 hours. No results for input(s): AMMONIA in the last 168 hours. Coagulation Profile: No results for input(s): INR, PROTIME in the last 168 hours. Cardiac Enzymes:  Recent Labs Lab 07/26/16 1922 07/27/16 0921  TROPONINI 0.45* 0.39*   BNP (last 3 results) No results for input(s): PROBNP in the last 8760 hours. HbA1C: No results for input(s): HGBA1C in the last 72 hours. CBG:  Recent Labs Lab 07/26/16 2157 07/27/16 0744 07/27/16 1217  GLUCAP 171* 160* 224*   Lipid Profile: No results for input(s): CHOL, HDL, LDLCALC, TRIG, CHOLHDL, LDLDIRECT in the last 72 hours. Thyroid Function Tests:  Recent Labs  07/26/16 1823  TSH 2.957   Anemia Panel:  Recent Labs  07/26/16 1823 07/26/16 1922  VITAMINB12 285  --   FOLATE 21.5  --   FERRITIN 222  --   TIBC 262  --   IRON 24*  --   RETICCTPCT  --  2.7   Urine analysis: No results found for: COLORURINE, APPEARANCEUR, LABSPEC, PHURINE, GLUCOSEU, HGBUR, BILIRUBINUR, KETONESUR, PROTEINUR, UROBILINOGEN, NITRITE, LEUKOCYTESUR Sepsis Labs: @LABRCNTIP (procalcitonin:4,lacticidven:4) )No results found for this or any previous visit (from the past 240 hour(s)).       Radiology Studies: Dg Chest 1 View  Result Date: 07/26/2016 CLINICAL DATA:  Anemia, weakness, shortness of breath EXAM: CHEST 1 VIEW COMPARISON:  None. FINDINGS: Cardiomegaly with pulmonary vascular congestion. No frank interstitial edema. Postsurgical changes related to prior CABG. Median sternotomy. IMPRESSION: No evidence of acute cardiopulmonary disease. Electronically Signed   By: Charline Bills M.D.   On: 07/26/2016 22:00   Dg Abd 1 View  Result Date: 07/26/2016 CLINICAL DATA:  Anemia, weakness, shortness of breath EXAM: ABDOMEN - 1 VIEW COMPARISON:  None. FINDINGS: Nonobstructive bowel gas pattern. Degenerative changes of the  lumbar spine. Brachytherapy seeds overlying the prostate. IMPRESSION: Unremarkable abdominal radiograph. Electronically Signed   By: Charline Bills M.D.   On: 07/26/2016 22:02      Scheduled Meds: . allopurinol  300 mg Oral Daily  . amLODipine  10 mg Oral Daily  . aspirin EC  81 mg Oral Daily  . atorvastatin  20 mg Oral q1800  . insulin aspart  0-9 Units Subcutaneous TID WC  . insulin detemir  15 Units Subcutaneous BID  . minoxidil  2.5 mg Oral BID  . pantoprazole  40 mg Oral Daily  . sodium chloride flush  3 mL Intravenous Q12H   Continuous Infusions: . sodium chloride       LOS: 0 days    Time spent in minutes: 35    Calvert CantorSaima Lankford Gutzmer, MD [facility] Pager: [email or web address] Password [email or web address] 07/27/2016, 2:12 PM

## 2016-07-27 NOTE — Progress Notes (Signed)
Spoke with cardiology fellow who reviewed EKG, no high grade avb.

## 2016-07-27 NOTE — Progress Notes (Signed)
Spoke with blood bank, patient is to only have one unit PRBCs. There is a duplicate order showing the 1 units two different times, but confirmed that patient is to only have 1 unit. Will continue to monitor.

## 2016-07-27 NOTE — Progress Notes (Signed)
Critical troponin level 0.45 called to MD with callback from Dr. Selena BattenKim. Dr. Selena BattenKim placing orders, advised to monitor pt. Pt PRBC infusing, nurse in room with patient. Pt is asymptomatic, resting comfortably. Some shortness of breath, but has not changed since arrival and pt states has been like this. Will continue to monitor.

## 2016-07-27 NOTE — Care Management Note (Addendum)
Case Management Note  Patient Details  Name: Tony DoomHershel Mcclure MRN: [id number] Date of Birth: Jul 05, 1928  Subjective/Objective:   Anemia, Bradycardia, CKD                 Action/Plan: Discharge Planning: NCM spoke to pt and lives in home with [name], [name],  [phone].  Offered choice for HH/list provided to pt. Provided list for private duty agencies also. States dtr works during the day. Pt states he will have his dtr review. States he would be interested in SNF rehab. Waiting for recommendations for PT/OT. Has RW, wheelchair and bedside commode at home.    PCP [name], [name] MD    Expected Discharge Date:                  Expected Discharge Plan:  Home w Home Health Services  In-House Referral:  NA  Discharge planning Services  CM Consult  Post Acute Care Choice:  Home Health Choice offered to:  Patient  DME Arranged:    DME Agency:     HH Arranged:    HH Agency:     Status of Service:  In process, will continue to follow  If discussed at Long Length of Stay Meetings, dates discussed:    Additional Comments:  Elliot CousinShavis, Trenise Turay Ellen, RN 07/27/2016, 4:58 PM

## 2016-07-28 ENCOUNTER — Other Ambulatory Visit: Payer: Self-pay | Admitting: Cardiology

## 2016-07-28 DIAGNOSIS — R001 Bradycardia, unspecified: Secondary | ICD-10-CM

## 2016-07-28 DIAGNOSIS — R55 Syncope and collapse: Secondary | ICD-10-CM

## 2016-07-28 DIAGNOSIS — R140 Abdominal distension (gaseous): Secondary | ICD-10-CM | POA: Diagnosis not present

## 2016-07-28 DIAGNOSIS — D638 Anemia in other chronic diseases classified elsewhere: Secondary | ICD-10-CM | POA: Diagnosis not present

## 2016-07-28 DIAGNOSIS — I480 Paroxysmal atrial fibrillation: Secondary | ICD-10-CM

## 2016-07-28 DIAGNOSIS — I4891 Unspecified atrial fibrillation: Secondary | ICD-10-CM | POA: Diagnosis not present

## 2016-07-28 HISTORY — DX: Paroxysmal atrial fibrillation: I48.0

## 2016-07-28 HISTORY — DX: Unspecified atrial fibrillation: I48.91

## 2016-07-28 LAB — BLOOD GAS, ARTERIAL
ACID-BASE DEFICIT: 5.2 mmol/L — AB (ref 0.0–2.0)
Bicarbonate: 18.8 mmol/L — ABNORMAL LOW (ref 20.0–28.0)
DRAWN BY: 270211
FIO2: 0.21
O2 SAT: 93.4 %
PATIENT TEMPERATURE: 98.6
pCO2 arterial: 33.5 mmHg (ref 32.0–48.0)
pH, Arterial: 7.367 (ref 7.350–7.450)
pO2, Arterial: 73.2 mmHg — ABNORMAL LOW (ref 83.0–108.0)

## 2016-07-28 LAB — BASIC METABOLIC PANEL
ANION GAP: 9 (ref 5–15)
BUN: 71 mg/dL — AB (ref 6–20)
CHLORIDE: 109 mmol/L (ref 101–111)
CO2: 19 mmol/L — ABNORMAL LOW (ref 22–32)
Calcium: 9 mg/dL (ref 8.9–10.3)
Creatinine, Ser: 2.19 mg/dL — ABNORMAL HIGH (ref 0.61–1.24)
GFR calc Af Amer: 30 mL/min — ABNORMAL LOW (ref >60)
GFR, EST NON AFRICAN AMERICAN: 26 mL/min — AB (ref >60)
GLUCOSE: 137 mg/dL — AB (ref 65–99)
POTASSIUM: 3.6 mmol/L (ref 3.5–5.1)
Sodium: 137 mmol/L (ref 135–145)

## 2016-07-28 LAB — GLUCOSE, CAPILLARY
Glucose-Capillary: 139 mg/dL — ABNORMAL HIGH (ref 65–99)
Glucose-Capillary: 194 mg/dL — ABNORMAL HIGH (ref 65–99)

## 2016-07-28 LAB — TYPE AND SCREEN
ABO/RH(D): O POS
ANTIBODY SCREEN: NEGATIVE
UNIT DIVISION: 0
Unit division: 0

## 2016-07-28 LAB — CBC
HEMATOCRIT: 30.1 % — AB (ref 39.0–52.0)
HEMOGLOBIN: 10.4 g/dL — AB (ref 13.0–17.0)
MCH: 30.6 pg (ref 26.0–34.0)
MCHC: 34.6 g/dL (ref 30.0–36.0)
MCV: 88.5 fL (ref 78.0–100.0)
Platelets: 147 10*3/uL — ABNORMAL LOW (ref 150–400)
RBC: 3.4 MIL/uL — ABNORMAL LOW (ref 4.22–5.81)
RDW: 16.4 % — AB (ref 11.5–15.5)
WBC: 6.2 10*3/uL (ref 4.0–10.5)

## 2016-07-28 LAB — BPAM RBC
Blood Product Expiration Date: 201806072359
Blood Product Expiration Date: 201806072359
ISSUE DATE / TIME: 201805200006
ISSUE DATE / TIME: 201805200459
UNIT TYPE AND RH: 5100
Unit Type and Rh: 5100

## 2016-07-28 LAB — TROPONIN I: Troponin I: 0.36 ng/mL (ref <0.03)

## 2016-07-28 MED ORDER — METOPROLOL TARTRATE 25 MG PO TABS
12.5000 mg | ORAL_TABLET | Freq: Two times a day (BID) | ORAL | 0 refills | Status: DC
Start: 2016-07-28 — End: 2016-08-18

## 2016-07-28 MED ORDER — METOPROLOL TARTRATE 12.5 MG HALF TABLET
12.5000 mg | ORAL_TABLET | Freq: Two times a day (BID) | ORAL | Status: DC
  Administered 2016-07-28: 12.5 mg via ORAL
  Filled 2016-07-28: qty 1

## 2016-07-28 MED ORDER — HYDRALAZINE HCL 25 MG PO TABS: 25.0000 mg | ORAL_TABLET | Freq: Three times a day (TID) | ORAL | 0 refills | Status: DC

## 2016-07-28 MED ORDER — HYDRALAZINE HCL 25 MG PO TABS
25.0000 mg | ORAL_TABLET | Freq: Three times a day (TID) | ORAL | Status: DC
  Administered 2016-07-28: 25 mg via ORAL
  Filled 2016-07-28: qty 1

## 2016-07-28 MED ORDER — APIXABAN 2.5 MG PO TABS: 2.5000 mg | ORAL_TABLET | Freq: Two times a day (BID) | ORAL | 0 refills | Status: AC

## 2016-07-28 MED ORDER — APIXABAN 2.5 MG PO TABS
2.5000 mg | ORAL_TABLET | Freq: Two times a day (BID) | ORAL | Status: DC
  Administered 2016-07-28: 2.5 mg via ORAL
  Filled 2016-07-28: qty 1

## 2016-07-28 NOTE — Progress Notes (Signed)
Date: Jul 28, 2016  Discharge orders review for case management needs.  None found  Rhonda Davis, BSN, RN3, CCM:  [phone] 

## 2016-07-28 NOTE — Progress Notes (Signed)
CSW met with patient and daughter at bedside.to follow up with request for Adult Senior Day programs. Patient and daughter inquired about programs in the community where the patient can go during the day time while the daughter is at work. CSW educated daughter about PACE program, [place] for [employer], and provided daughter with [facility] center information and contact to discuss programs in the area. Patient daughter inquired about SCAT transportation, CSW provided scat application and educated daughter about following up.  No other needs identified.   Kathrin Greathouse, Latanya Presser, MSW Clinical Social Worker 5E and Psychiatric Service Line [phone] 07/28/2016  12:39 PM

## 2016-07-28 NOTE — Discharge Instructions (Signed)

## 2016-07-28 NOTE — Discharge Summary (Signed)
Physician Discharge Summary  Tony Mcclure ONG:[id number] DOB: 10/06/1928 DOA: 07/26/2016  PCP: [name], MD  Admit date: 07/26/2016 Discharge date: 07/28/2016  Admitted From: home Disposition:  home   Recommendations for Outpatient Follow-up:  1. F/u in 1 wk on HR 2. Cardiology will be calling him for an event monitor 3. F/u BP as changes in medications have been made- resume HCTZ/Lisinopril as needed based on renal function   Home Health:  none  Discharge Condition:  stable  CODE STATUS:  DNR   Consultations:  none    Discharge Diagnoses:  Principal Problem:   Atrial fibrillation with slow ventricular response (HCC) Active Problems:   Paroxysmal A-fib (HCC)   Anemia of chronic disease   CKD (chronic kidney disease), stage III   CAD (coronary artery disease)   Type 2 diabetes mellitus (HCC)   Essential hypertension   S/P bilateral below knee amputation (HCC)    Subjective: Feels well today. No fatigue or DOE. He has ambulated down the hall with assistance.   Brief Summary: Tony Craneis a 83 y.o.gentleman with a history of CAD S/P CABG, HTN, DM, CKD 3, and Bilateral BKA (presumably related to complicated diabetic foot wounds; patient is not sure) who presents to the ED because he was advised to come in by PCP for abnormal labs. He was found to be anemic and admitted to having DOE and severe fatigue. He was admitted for a blood transfusion. HR noted to be dropping down to the 30s on telemetry with 1 episode of a 2 second pause.   Hospital Course:  Principal Problem:   Symptomatic anemia - has been transfused 2 U PRBC but no improvement in symptoms noted the next morning- - anemia panel reveals AOCD - stool hemoccult neg  Active Problems: New onset A-fib with slow ventricular response - CHA2DS2-VASc Score at least 4 - suspect the bradycardia is the actual cause of his DOE and fatigue- HR is dropping into 30s with a 2.6 second pause noted on  telemetry -  Lopressor (50 mg BID) was held and subsequently HR has increased- he feels that his fatigue and DOE have resovled - ECHO reveals LVH with elevated LV pressures and LAE - TSH within normal limit -although he is a double amputee and appears to be a high fall risk, he ambulates with a walker  and uses a wheelchair for longer distances - he states that he has has not had any falls - have discussed starting Coumadin vs Eliquis- he would like to try Eliquis-pharmacy has spoken with him in regards to his new prescription- I have dosed it appropriately for him at 2.5 mg BID - will need to resume a low dose of Lopressor to prevent RVR as HR has been rising to 90s at some points - will resume at 12.5 mg BID- have requested an event monitor to further assess for tachy/brady syndrome.    CKD (chronic kidney disease), stage III-IV? Vs AKI - no baseline Cr to compare with- patient states that he has been told that he has "weak" kidneys - see labs below in regard to renal function which has improved slightly during the hospital stay - at this point I recommend holding lisinopril/HCTZ and repeating Bmet as outpt- PCP to decide on resumption - recommend a nephrology referral as outpt  Abdominal distension - xray unrevealing- he states abdominal distension is chronic    CAD (coronary artery disease) s/p CABG - cont aspirin    Type 2 diabetes mellitus   -  cont Levemir and ISS     Essential hypertension - we did have to hold Lopressor due to bradycardia  - ACE/HCTZ were held on admission as we are not certain if his kidney failure is acute or choronic - continued Norvasc and Minoxidil- have resumed Lopressor at a lower dose - will add a low dose of Hydralazine to replace the medications that have been held/cut back    S/P bilateral below knee amputation [facility](HCC)   Discharge Instructions  Discharge Instructions    Diet - low sodium heart healthy    Complete by:  As directed    Diet  Carb Modified    Complete by:  As directed    Increase activity slowly    Complete by:  As directed      Allergies as of 07/28/2016   No Known Allergies     Medication List    STOP taking these medications   aspirin EC 81 MG tablet   lisinopril-hydrochlorothiazide 20-25 MG tablet Commonly known as:  PRINZIDE,ZESTORETIC     TAKE these medications   allopurinol 300 MG tablet Commonly known as:  ZYLOPRIM Take 300 mg by mouth daily.   amLODipine 10 MG tablet Commonly known as:  NORVASC Take 10 mg by mouth daily.   apixaban 2.5 MG Tabs tablet Commonly known as:  ELIQUIS Take 1 tablet (2.5 mg total) by mouth 2 (two) times daily.   doxycycline 100 MG EC tablet Commonly known as:  DORYX Take 100 mg by mouth 2 (two) times daily. For 7 Days. ABT Start Date 07/25/16 & End Date 08/01/16.   hydrALAZINE 25 MG tablet Commonly known as:  APRESOLINE Take 1 tablet (25 mg total) by mouth every 8 (eight) hours.   insulin aspart 100 UNIT/ML injection Commonly known as:  novoLOG Inject 8 Units into the skin daily.   insulin detemir 100 UNIT/ML injection Commonly known as:  LEVEMIR Inject 12-18 Units into the skin 2 (two) times daily. Inject 18 units in the morning and Inject 12 units in the evening.   linagliptin 5 MG Tabs tablet Commonly known as:  TRADJENTA Take 5 mg by mouth daily.   metoprolol tartrate 25 MG tablet Commonly known as:  LOPRESSOR Take 0.5 tablets (12.5 mg total) by mouth 2 (two) times daily. What changed:  medication strength  how much to take   minoxidil 2.5 MG tablet Commonly known as:  LONITEN Take 2.5 mg by mouth 2 (two) times daily.   polyethylene glycol packet Commonly known as:  MIRALAX / GLYCOLAX Take 17 g by mouth daily as needed for moderate constipation.   simvastatin 40 MG tablet Commonly known as:  ZOCOR Take 40 mg by mouth every evening.       No Known Allergies   Procedures/Studies: 2 D ECHO Study Conclusions  - Left  ventricle: The cavity size was normal. Wall thickness was   increased in a pattern of moderate LVH. Systolic function was   normal. The estimated ejection fraction was in the range of 60%   to 65%. Wall motion was normal; there were no regional wall   motion abnormalities. Doppler parameters are consistent with   restrictive physiology, indicative of decreased left ventricular   diastolic compliance and/or increased left atrial pressure.   Doppler parameters are consistent with high ventricular filling   pressure. - Mitral valve: Calcified annulus. - Left atrium: The atrium was mildly dilated.  Impressions:  - Normal LV systolic function; moderate LVH; restrictive filling   with elevated  LV filling pressure; mild LAE.  Dg Chest 1 View  Result Date: 07/26/2016 CLINICAL DATA:  Anemia, weakness, shortness of breath EXAM: CHEST 1 VIEW COMPARISON:  None. FINDINGS: Cardiomegaly with pulmonary vascular congestion. No frank interstitial edema. Postsurgical changes related to prior CABG. Median sternotomy. IMPRESSION: No evidence of acute cardiopulmonary disease. Electronically Signed   By: Tony Mcclure M.D.   On: 07/26/2016 22:00   Dg Abd 1 View  Result Date: 07/26/2016 CLINICAL DATA:  Anemia, weakness, shortness of breath EXAM: ABDOMEN - 1 VIEW COMPARISON:  None. FINDINGS: Nonobstructive bowel gas pattern. Degenerative changes of the lumbar spine. Brachytherapy seeds overlying the prostate. IMPRESSION: Unremarkable abdominal radiograph. Electronically Signed   By: Tony Mcclure M.D.   On: 07/26/2016 22:02       Discharge Exam: Vitals:   07/28/16 0637 07/28/16 0848  BP: (!) 152/46 (!) 152/43  Pulse: 75 73  Resp:    Temp:     Vitals:   07/27/16 2121 07/28/16 0528 07/28/16 0637 07/28/16 0848  BP: (!) 158/61 (!) 205/56 (!) 152/46 (!) 152/43  Pulse: (!) 58 68 75 73  Resp: 18 (!) 22    Temp: 98 F (36.7 C) 98.1 F (36.7 C)    TempSrc: Oral Oral    SpO2: 96% 95%    Weight:       Height:        General: Pt is alert, awake, not in acute distress Cardiovascular: RRR, S1/S2 +, no rubs, no gallops Respiratory: CTA bilaterally, no wheezing, no rhonchi Abdominal: Soft, NT, ND, bowel sounds + Extremities: no edema, no cyanosis    The results of significant diagnostics from this hospitalization (including imaging, microbiology, ancillary and laboratory) are listed below for reference.     Microbiology: No results found for this or any previous visit (from the past 240 hour(s)).   Labs: BNP (last 3 results) No results for input(s): BNP in the last 8760 hours. Basic Metabolic Panel:  Recent Labs Lab 07/26/16 1922 07/27/16 0921 07/28/16 0143  NA 135 133* 137  K 4.4 4.0 3.6  CL 106 107 109  CO2 21* 20* 19*  GLUCOSE 189* 184* 137*  BUN 78* 74* 71*  CREATININE 2.55* 2.32* 2.19*  CALCIUM 8.7* 8.5* 9.0  MG  --  2.2  --    Liver Function Tests:  Recent Labs Lab 07/26/16 1922  AST 17  ALT 22  ALKPHOS 98  BILITOT 0.6  PROT 6.6  ALBUMIN 3.8   No results for input(s): LIPASE, AMYLASE in the last 168 hours. No results for input(s): AMMONIA in the last 168 hours. CBC:  Recent Labs Lab 07/26/16 1922 07/27/16 0921 07/28/16 0143  WBC 6.2 5.2 6.2  HGB 8.6* 10.2* 10.4*  HCT 25.3* 29.8* 30.1*  MCV 92.3 89.0 88.5  PLT 149* 152 147*   Cardiac Enzymes:  Recent Labs Lab 07/26/16 1922 07/27/16 0921 07/27/16 1530 07/27/16 2010 07/28/16 0143  CKTOTAL  --  60  --   --   --   CKMB  --  2.7  --   --   --   TROPONINI 0.45* 0.39* 0.27* 0.28* 0.36*   BNP: Invalid input(s): POCBNP CBG:  Recent Labs Lab 07/27/16 0744 07/27/16 1217 07/27/16 1742 07/27/16 2147 07/28/16 0724  GLUCAP 160* 224* 301* 167* 139*   D-Dimer No results for input(s): DDIMER in the last 72 hours. Hgb A1c No results for input(s): HGBA1C in the last 72 hours. Lipid Profile No results for input(s): CHOL, HDL, LDLCALC, TRIG,  CHOLHDL, LDLDIRECT in the last 72  hours. Thyroid function studies  Recent Labs  07/26/16 1823  TSH 2.957   Anemia work up  Recent Labs  07/26/16 1823 07/26/16 1922  VITAMINB12 285  --   FOLATE 21.5  --   FERRITIN 222  --   TIBC 262  --   IRON 24*  --   RETICCTPCT  --  2.7   Urinalysis No results found for: COLORURINE, APPEARANCEUR, LABSPEC, PHURINE, GLUCOSEU, HGBUR, BILIRUBINUR, KETONESUR, PROTEINUR, UROBILINOGEN, NITRITE, LEUKOCYTESUR Sepsis Labs Invalid input(s): PROCALCITONIN,  WBC,  LACTICIDVEN Microbiology No results found for this or any previous visit (from the past 240 hour(s)).   Time coordinating discharge: Over 30 minutes  SIGNED:   Calvert Cantor, MD  [facility] 07/28/2016, 10:38 AM Pager   If 7PM-7AM, please contact night-coverage [email or web address] Password [email or web address]

## 2016-08-05 ENCOUNTER — Telehealth: Payer: Self-pay | Admitting: Physician Assistant

## 2016-08-05 ENCOUNTER — Ambulatory Visit (INDEPENDENT_AMBULATORY_CARE_PROVIDER_SITE_OTHER): Payer: Medicare Other

## 2016-08-05 DIAGNOSIS — R55 Syncope and collapse: Secondary | ICD-10-CM | POA: Diagnosis not present

## 2016-08-05 NOTE — Telephone Encounter (Signed)
[employer] representative called stating event monitor recorded Afib in the 60-70s. His heart rate seems to be controlled at this time and EPIC chart review revealed he is already on eliquis 2.5 mg BID.   Tony Dusterngela Nicole Elzora Cullins, PA-C 08/05/2016, 7:32 PM [phone] [facility] [address] [place], [place] [address]

## 2016-08-11 ENCOUNTER — Encounter (HOSPITAL_COMMUNITY): Payer: Self-pay | Admitting: Emergency Medicine

## 2016-08-11 ENCOUNTER — Emergency Department (HOSPITAL_COMMUNITY): Payer: Medicare Other

## 2016-08-11 ENCOUNTER — Inpatient Hospital Stay (HOSPITAL_COMMUNITY)
Admission: EM | Admit: 2016-08-11 | Discharge: 2016-08-18 | DRG: 871 | Disposition: A | Discharge status: Skilled Nursing Facility | Payer: Medicare Other | Attending: Internal Medicine | Admitting: Internal Medicine

## 2016-08-11 DIAGNOSIS — E785 Hyperlipidemia, unspecified: Secondary | ICD-10-CM | POA: Diagnosis present

## 2016-08-11 DIAGNOSIS — I4891 Unspecified atrial fibrillation: Secondary | ICD-10-CM | POA: Diagnosis not present

## 2016-08-11 DIAGNOSIS — F4321 Adjustment disorder with depressed mood: Secondary | ICD-10-CM | POA: Diagnosis present

## 2016-08-11 DIAGNOSIS — Z8249 Family history of ischemic heart disease and other diseases of the circulatory system: Secondary | ICD-10-CM

## 2016-08-11 DIAGNOSIS — J181 Lobar pneumonia, unspecified organism: Secondary | ICD-10-CM | POA: Diagnosis not present

## 2016-08-11 DIAGNOSIS — E8729 Other acidosis: Secondary | ICD-10-CM

## 2016-08-11 DIAGNOSIS — N400 Enlarged prostate without lower urinary tract symptoms: Secondary | ICD-10-CM | POA: Diagnosis present

## 2016-08-11 DIAGNOSIS — N184 Chronic kidney disease, stage 4 (severe): Secondary | ICD-10-CM | POA: Diagnosis present

## 2016-08-11 DIAGNOSIS — Z7901 Long term (current) use of anticoagulants: Secondary | ICD-10-CM

## 2016-08-11 DIAGNOSIS — Z89512 Acquired absence of left leg below knee: Secondary | ICD-10-CM | POA: Diagnosis not present

## 2016-08-11 DIAGNOSIS — I130 Hypertensive heart and chronic kidney disease with heart failure and stage 1 through stage 4 chronic kidney disease, or unspecified chronic kidney disease: Secondary | ICD-10-CM | POA: Diagnosis present

## 2016-08-11 DIAGNOSIS — Z6829 Body mass index (BMI) 29.0-29.9, adult: Secondary | ICD-10-CM

## 2016-08-11 DIAGNOSIS — J189 Pneumonia, unspecified organism: Secondary | ICD-10-CM | POA: Diagnosis present

## 2016-08-11 DIAGNOSIS — I2510 Atherosclerotic heart disease of native coronary artery without angina pectoris: Secondary | ICD-10-CM | POA: Diagnosis present

## 2016-08-11 DIAGNOSIS — R1084 Generalized abdominal pain: Secondary | ICD-10-CM | POA: Diagnosis not present

## 2016-08-11 DIAGNOSIS — Z951 Presence of aortocoronary bypass graft: Secondary | ICD-10-CM

## 2016-08-11 DIAGNOSIS — K5900 Constipation, unspecified: Secondary | ICD-10-CM

## 2016-08-11 DIAGNOSIS — I5033 Acute on chronic diastolic (congestive) heart failure: Secondary | ICD-10-CM | POA: Diagnosis present

## 2016-08-11 DIAGNOSIS — I10 Essential (primary) hypertension: Secondary | ICD-10-CM | POA: Diagnosis present

## 2016-08-11 DIAGNOSIS — R0600 Dyspnea, unspecified: Secondary | ICD-10-CM | POA: Diagnosis not present

## 2016-08-11 DIAGNOSIS — E538 Deficiency of other specified B group vitamins: Secondary | ICD-10-CM | POA: Diagnosis present

## 2016-08-11 DIAGNOSIS — Z87891 Personal history of nicotine dependence: Secondary | ICD-10-CM

## 2016-08-11 DIAGNOSIS — R0602 Shortness of breath: Secondary | ICD-10-CM | POA: Diagnosis present

## 2016-08-11 DIAGNOSIS — D696 Thrombocytopenia, unspecified: Secondary | ICD-10-CM | POA: Diagnosis present

## 2016-08-11 DIAGNOSIS — Z66 Do not resuscitate: Secondary | ICD-10-CM | POA: Diagnosis present

## 2016-08-11 DIAGNOSIS — R627 Adult failure to thrive: Secondary | ICD-10-CM | POA: Diagnosis not present

## 2016-08-11 DIAGNOSIS — A419 Sepsis, unspecified organism: Secondary | ICD-10-CM | POA: Diagnosis present

## 2016-08-11 DIAGNOSIS — I5031 Acute diastolic (congestive) heart failure: Secondary | ICD-10-CM | POA: Diagnosis not present

## 2016-08-11 DIAGNOSIS — E1165 Type 2 diabetes mellitus with hyperglycemia: Secondary | ICD-10-CM | POA: Diagnosis present

## 2016-08-11 DIAGNOSIS — I509 Heart failure, unspecified: Secondary | ICD-10-CM

## 2016-08-11 DIAGNOSIS — I482 Chronic atrial fibrillation: Secondary | ICD-10-CM | POA: Diagnosis present

## 2016-08-11 DIAGNOSIS — D631 Anemia in chronic kidney disease: Secondary | ICD-10-CM | POA: Diagnosis present

## 2016-08-11 DIAGNOSIS — D509 Iron deficiency anemia, unspecified: Secondary | ICD-10-CM | POA: Diagnosis present

## 2016-08-11 DIAGNOSIS — N183 Chronic kidney disease, stage 3 unspecified: Secondary | ICD-10-CM

## 2016-08-11 DIAGNOSIS — R52 Pain, unspecified: Secondary | ICD-10-CM

## 2016-08-11 DIAGNOSIS — D649 Anemia, unspecified: Secondary | ICD-10-CM | POA: Diagnosis not present

## 2016-08-11 DIAGNOSIS — Y95 Nosocomial condition: Secondary | ICD-10-CM | POA: Diagnosis present

## 2016-08-11 DIAGNOSIS — Z515 Encounter for palliative care: Secondary | ICD-10-CM | POA: Diagnosis not present

## 2016-08-11 DIAGNOSIS — E11649 Type 2 diabetes mellitus with hypoglycemia without coma: Secondary | ICD-10-CM | POA: Diagnosis present

## 2016-08-11 DIAGNOSIS — E119 Type 2 diabetes mellitus without complications: Secondary | ICD-10-CM

## 2016-08-11 DIAGNOSIS — E1122 Type 2 diabetes mellitus with diabetic chronic kidney disease: Secondary | ICD-10-CM

## 2016-08-11 DIAGNOSIS — I480 Paroxysmal atrial fibrillation: Secondary | ICD-10-CM | POA: Diagnosis present

## 2016-08-11 DIAGNOSIS — J9601 Acute respiratory failure with hypoxia: Secondary | ICD-10-CM | POA: Diagnosis present

## 2016-08-11 DIAGNOSIS — I495 Sick sinus syndrome: Secondary | ICD-10-CM | POA: Diagnosis present

## 2016-08-11 DIAGNOSIS — E876 Hypokalemia: Secondary | ICD-10-CM | POA: Diagnosis not present

## 2016-08-11 DIAGNOSIS — E872 Acidosis: Secondary | ICD-10-CM

## 2016-08-11 DIAGNOSIS — E1140 Type 2 diabetes mellitus with diabetic neuropathy, unspecified: Secondary | ICD-10-CM | POA: Diagnosis present

## 2016-08-11 DIAGNOSIS — M109 Gout, unspecified: Secondary | ICD-10-CM | POA: Diagnosis present

## 2016-08-11 DIAGNOSIS — Z89511 Acquired absence of right leg below knee: Secondary | ICD-10-CM | POA: Diagnosis not present

## 2016-08-11 DIAGNOSIS — Z794 Long term (current) use of insulin: Secondary | ICD-10-CM

## 2016-08-11 DIAGNOSIS — I481 Persistent atrial fibrillation: Secondary | ICD-10-CM | POA: Diagnosis present

## 2016-08-11 DIAGNOSIS — D508 Other iron deficiency anemias: Secondary | ICD-10-CM | POA: Diagnosis not present

## 2016-08-11 LAB — CBC WITH DIFFERENTIAL/PLATELET
BASOS ABS: 0 10*3/uL (ref 0.0–0.1)
Band Neutrophils: 0 %
Basophils Relative: 0 %
Blasts: 0 %
EOS PCT: 0 %
Eosinophils Absolute: 0 10*3/uL (ref 0.0–0.7)
HCT: 29.5 % — ABNORMAL LOW (ref 39.0–52.0)
Hemoglobin: 9.7 g/dL — ABNORMAL LOW (ref 13.0–17.0)
LYMPHS ABS: 0.9 10*3/uL (ref 0.7–4.0)
Lymphocytes Relative: 8 %
MCH: 30.4 pg (ref 26.0–34.0)
MCHC: 32.9 g/dL (ref 30.0–36.0)
MCV: 92.5 fL (ref 78.0–100.0)
METAMYELOCYTES PCT: 0 %
MONO ABS: 0.8 10*3/uL (ref 0.1–1.0)
MYELOCYTES: 0 %
Monocytes Relative: 7 %
Neutro Abs: 10 10*3/uL — ABNORMAL HIGH (ref 1.7–7.7)
Neutrophils Relative %: 85 %
Other: 0 %
PLATELETS: 139 10*3/uL — AB (ref 150–400)
Promyelocytes Absolute: 0 %
RBC: 3.19 MIL/uL — AB (ref 4.22–5.81)
RDW: 16.5 % — ABNORMAL HIGH (ref 11.5–15.5)
WBC: 11.7 10*3/uL — AB (ref 4.0–10.5)
nRBC: 0 /100 WBC

## 2016-08-11 LAB — I-STAT ARTERIAL BLOOD GAS, ED
Acid-base deficit: 9 mmol/L — ABNORMAL HIGH (ref 0.0–2.0)
Bicarbonate: 18 mmol/L — ABNORMAL LOW (ref 20.0–28.0)
O2 Saturation: 99 %
PCO2 ART: 46.8 mmHg (ref 32.0–48.0)
Patient temperature: 101.4
TCO2: 19 mmol/L (ref 0–100)
pH, Arterial: 7.201 — ABNORMAL LOW (ref 7.350–7.450)
pO2, Arterial: 170 mmHg — ABNORMAL HIGH (ref 83.0–108.0)

## 2016-08-11 LAB — I-STAT CG4 LACTIC ACID, ED
LACTIC ACID, VENOUS: 1.48 mmol/L (ref 0.5–1.9)
Lactic Acid, Venous: 1.15 mmol/L (ref 0.5–1.9)

## 2016-08-11 LAB — CBC AND DIFFERENTIAL
HCT: 30 — AB (ref 41–53)
HCT: 29 — AB (ref 41–53)
HEMOGLOBIN: 9.7 — AB (ref 13.5–17.5)
Hemoglobin: 9.9 — AB (ref 13.5–17.5)
Platelets: 139 — AB (ref 150–399)
WBC: 11.7

## 2016-08-11 LAB — COMPREHENSIVE METABOLIC PANEL
ALT: 23 U/L (ref 17–63)
AST: 21 U/L (ref 15–41)
Albumin: 3.7 g/dL (ref 3.5–5.0)
Alkaline Phosphatase: 89 U/L (ref 38–126)
Anion gap: 15 (ref 5–15)
BILIRUBIN TOTAL: 1.3 mg/dL — AB (ref 0.3–1.2)
BUN: 59 mg/dL — AB (ref 6–20)
CHLORIDE: 106 mmol/L (ref 101–111)
CO2: 17 mmol/L — ABNORMAL LOW (ref 22–32)
CREATININE: 2.38 mg/dL — AB (ref 0.61–1.24)
Calcium: 9.4 mg/dL (ref 8.9–10.3)
GFR calc Af Amer: 27 mL/min — ABNORMAL LOW (ref >60)
GFR, EST NON AFRICAN AMERICAN: 23 mL/min — AB (ref >60)
Glucose, Bld: 232 mg/dL — ABNORMAL HIGH (ref 65–99)
Potassium: 4 mmol/L (ref 3.5–5.1)
Sodium: 138 mmol/L (ref 135–145)
Total Protein: 6.5 g/dL (ref 6.5–8.1)

## 2016-08-11 LAB — LIPASE, BLOOD: Lipase: 17 U/L (ref 11–51)

## 2016-08-11 LAB — BASIC METABOLIC PANEL
BUN: 59 — AB (ref 4–21)
BUN: 55 — AB (ref 4–21)
Creatinine: 2.4 — AB (ref 0.6–1.3)
Creatinine: 2.4 — AB (ref 0.6–1.3)
GLUCOSE: 232
GLUCOSE: 238
POTASSIUM: 4 (ref 3.4–5.3)
POTASSIUM: 4 (ref 3.4–5.3)
SODIUM: 138 (ref 137–147)
SODIUM: 138 (ref 137–147)

## 2016-08-11 LAB — I-STAT VENOUS BLOOD GAS, ED
Acid-base deficit: 8 mmol/L — ABNORMAL HIGH (ref 0.0–2.0)
BICARBONATE: 17 mmol/L — AB (ref 20.0–28.0)
O2 Saturation: 97 %
PCO2 VEN: 32.2 mmHg — AB (ref 44.0–60.0)
PH VEN: 7.332 (ref 7.250–7.430)
TCO2: 18 mmol/L (ref 0–100)
pO2, Ven: 99 mmHg — ABNORMAL HIGH (ref 32.0–45.0)

## 2016-08-11 LAB — I-STAT CHEM 8, ED
BUN: 55 mg/dL — ABNORMAL HIGH (ref 6–20)
CALCIUM ION: 1.26 mmol/L (ref 1.15–1.40)
CREATININE: 2.4 mg/dL — AB (ref 0.61–1.24)
Chloride: 108 mmol/L (ref 101–111)
GLUCOSE: 238 mg/dL — AB (ref 65–99)
HCT: 29 % — ABNORMAL LOW (ref 39.0–52.0)
HEMOGLOBIN: 9.9 g/dL — AB (ref 13.0–17.0)
Potassium: 4 mmol/L (ref 3.5–5.1)
Sodium: 138 mmol/L (ref 135–145)
TCO2: 19 mmol/L (ref 0–100)

## 2016-08-11 LAB — HEPATIC FUNCTION PANEL
ALT: 23 (ref 10–40)
AST: 21 (ref 14–40)
Alkaline Phosphatase: 89 (ref 25–125)
Bilirubin, Total: 1.3

## 2016-08-11 LAB — I-STAT TROPONIN, ED: TROPONIN I, POC: 0.15 ng/mL — AB (ref 0.00–0.08)

## 2016-08-11 LAB — PROCALCITONIN: PROCALCITONIN: 1.3 ng/mL

## 2016-08-11 LAB — LACTIC ACID, PLASMA
LACTIC ACID, VENOUS: 1.6 mmol/L (ref 0.5–1.9)
Lactic Acid, Venous: 1.2 mmol/L (ref 0.5–1.9)

## 2016-08-11 LAB — BRAIN NATRIURETIC PEPTIDE: B Natriuretic Peptide: 907.9 pg/mL — ABNORMAL HIGH (ref 0.0–100.0)

## 2016-08-11 LAB — GLUCOSE, CAPILLARY: Glucose-Capillary: 305 mg/dL — ABNORMAL HIGH (ref 65–99)

## 2016-08-11 MED ORDER — APIXABAN 2.5 MG PO TABS
2.5000 mg | ORAL_TABLET | Freq: Two times a day (BID) | ORAL | Status: DC
  Administered 2016-08-12 – 2016-08-18 (×13): 2.5 mg via ORAL
  Filled 2016-08-11 (×15): qty 1

## 2016-08-11 MED ORDER — ONDANSETRON HCL 4 MG/2ML IJ SOLN: 4.0000 mg | Freq: Four times a day (QID) | INTRAMUSCULAR | Status: DC | PRN

## 2016-08-11 MED ORDER — ALBUTEROL SULFATE (2.5 MG/3ML) 0.083% IN NEBU: 5.0000 mg | INHALATION_SOLUTION | Freq: Once | RESPIRATORY_TRACT | Status: DC

## 2016-08-11 MED ORDER — INSULIN ASPART 100 UNIT/ML ~~LOC~~ SOLN
8.0000 [IU] | Freq: Two times a day (BID) | SUBCUTANEOUS | Status: DC
  Administered 2016-08-12: 8 [IU] via SUBCUTANEOUS

## 2016-08-11 MED ORDER — INSULIN DETEMIR 100 UNIT/ML ~~LOC~~ SOLN: 12.0000 [IU] | Freq: Two times a day (BID) | SUBCUTANEOUS | Status: DC

## 2016-08-11 MED ORDER — INSULIN DETEMIR 100 UNIT/ML ~~LOC~~ SOLN
14.0000 [IU] | Freq: Every day | SUBCUTANEOUS | Status: DC
  Administered 2016-08-12: 14 [IU] via SUBCUTANEOUS
  Filled 2016-08-11: qty 0.14

## 2016-08-11 MED ORDER — IPRATROPIUM-ALBUTEROL 0.5-2.5 (3) MG/3ML IN SOLN
3.0000 mL | RESPIRATORY_TRACT | Status: DC | PRN
  Administered 2016-08-14: 3 mL via RESPIRATORY_TRACT

## 2016-08-11 MED ORDER — METOPROLOL TARTRATE 12.5 MG HALF TABLET
12.5000 mg | ORAL_TABLET | Freq: Two times a day (BID) | ORAL | Status: DC
  Administered 2016-08-12: 12.5 mg via ORAL
  Filled 2016-08-11: qty 1

## 2016-08-11 MED ORDER — SODIUM CHLORIDE 0.9 % IV SOLN: 250.0000 mL | INTRAVENOUS | Status: DC | PRN

## 2016-08-11 MED ORDER — HYDRALAZINE HCL 20 MG/ML IJ SOLN: 5.0000 mg | INTRAMUSCULAR | Status: DC | PRN

## 2016-08-11 MED ORDER — SIMETHICONE 80 MG PO CHEW: 80.0000 mg | CHEWABLE_TABLET | Freq: Four times a day (QID) | ORAL | Status: DC | PRN

## 2016-08-11 MED ORDER — SODIUM CHLORIDE 0.9% FLUSH
3.0000 mL | Freq: Two times a day (BID) | INTRAVENOUS | Status: DC
  Administered 2016-08-11 – 2016-08-18 (×14): 3 mL via INTRAVENOUS

## 2016-08-11 MED ORDER — DEXTROSE 5 % IV SOLN
1.0000 g | Freq: Once | INTRAVENOUS | Status: AC
  Administered 2016-08-11: 1 g via INTRAVENOUS
  Filled 2016-08-11: qty 1

## 2016-08-11 MED ORDER — FUROSEMIDE 10 MG/ML IJ SOLN
40.0000 mg | Freq: Two times a day (BID) | INTRAMUSCULAR | Status: DC
Start: 2016-08-12 — End: 2016-08-12
  Administered 2016-08-12: 40 mg via INTRAVENOUS
  Filled 2016-08-11: qty 4

## 2016-08-11 MED ORDER — FUROSEMIDE 10 MG/ML IJ SOLN
40.0000 mg | Freq: Once | INTRAMUSCULAR | Status: AC
  Administered 2016-08-11: 40 mg via INTRAVENOUS
  Filled 2016-08-11: qty 4

## 2016-08-11 MED ORDER — SODIUM CHLORIDE 0.9% FLUSH: 3.0000 mL | INTRAVENOUS | Status: DC | PRN

## 2016-08-11 MED ORDER — ALBUTEROL SULFATE (2.5 MG/3ML) 0.083% IN NEBU
5.0000 mg | INHALATION_SOLUTION | RESPIRATORY_TRACT | Status: DC
  Administered 2016-08-11: 5 mg via RESPIRATORY_TRACT
  Filled 2016-08-11: qty 6

## 2016-08-11 MED ORDER — AMLODIPINE BESYLATE 10 MG PO TABS
10.0000 mg | ORAL_TABLET | Freq: Every day | ORAL | Status: DC
  Administered 2016-08-12 – 2016-08-18 (×7): 10 mg via ORAL
  Filled 2016-08-11 (×7): qty 1

## 2016-08-11 MED ORDER — HYDRALAZINE HCL 25 MG PO TABS
25.0000 mg | ORAL_TABLET | Freq: Three times a day (TID) | ORAL | Status: DC
  Administered 2016-08-12 – 2016-08-14 (×5): 25 mg via ORAL
  Filled 2016-08-11 (×7): qty 1

## 2016-08-11 MED ORDER — VANCOMYCIN HCL IN DEXTROSE 1-5 GM/200ML-% IV SOLN
1000.0000 mg | INTRAVENOUS | Status: DC
  Administered 2016-08-12: 1000 mg via INTRAVENOUS
  Filled 2016-08-11: qty 200

## 2016-08-11 MED ORDER — INSULIN DETEMIR 100 UNIT/ML ~~LOC~~ SOLN
12.0000 [IU] | Freq: Every day | SUBCUTANEOUS | Status: DC
  Administered 2016-08-11: 12 [IU] via SUBCUTANEOUS
  Filled 2016-08-11 (×2): qty 0.12

## 2016-08-11 MED ORDER — ACETAMINOPHEN 325 MG PO TABS: 650.0000 mg | ORAL_TABLET | ORAL | Status: DC | PRN

## 2016-08-11 MED ORDER — SODIUM CHLORIDE 0.9 % IV SOLN
2000.0000 mg | Freq: Once | INTRAVENOUS | Status: AC
  Administered 2016-08-11: 2000 mg via INTRAVENOUS
  Filled 2016-08-11: qty 2000

## 2016-08-11 MED ORDER — INSULIN ASPART 100 UNIT/ML ~~LOC~~ SOLN
0.0000 [IU] | Freq: Every day | SUBCUTANEOUS | Status: DC
  Administered 2016-08-11: 4 [IU] via SUBCUTANEOUS

## 2016-08-11 MED ORDER — IPRATROPIUM BROMIDE 0.02 % IN SOLN
0.5000 mg | Freq: Once | RESPIRATORY_TRACT | Status: AC
  Administered 2016-08-11: 0.5 mg via RESPIRATORY_TRACT
  Filled 2016-08-11: qty 2.5

## 2016-08-11 NOTE — ED Notes (Signed)
Pt placed on NRB per EDP.

## 2016-08-11 NOTE — ED Notes (Signed)
RT at bedside.

## 2016-08-11 NOTE — Progress Notes (Signed)
Attempted to call back ED.

## 2016-08-11 NOTE — ED Notes (Signed)
Attempted report x1. 

## 2016-08-11 NOTE — ED Provider Notes (Signed)
[facility]-EMERGENCY DEPT Provider Note   CSN: [id number] Arrival date & time: 08/11/16  1631     History   Chief Complaint Chief Complaint  Patient presents with  . Shortness of Breath    HPI Tony Mcclure is a 83 y.o. male presenting with acute shortness of breath starting today prior to arrival. He has also been having abdominal distention and abdominal pain for the past 2 weeks. Reports that he has not been eating any solid foods for a week. No bowel movement since last week. He has been drinking ensure, water, juice and coffee. He was recently admitted and has had blood transfusions. Daughter reports that his metoprolol was recently reduced from 100 mg a day to 25mg . He states that he has still been passing gas.  HPI  Past Medical History:  Diagnosis Date  . BPH (benign prostatic hyperplasia)   . CAD (coronary artery disease)   . Diabetes mellitus without complication (HCC)   . Hypertension   . Kidney disease   . S/P bilateral BKA (below knee amputation) [facility])     Patient Active Problem List   Diagnosis Date Noted  . Anemia of chronic disease 07/28/2016  . Paroxysmal A-fib (HCC) 07/28/2016  . Atrial fibrillation with slow ventricular response (HCC) 07/28/2016  . CKD (chronic kidney disease), stage III 07/26/2016  . CAD (coronary artery disease) 07/26/2016  . Type 2 diabetes mellitus (HCC) 07/26/2016  . Essential hypertension 07/26/2016  . S/P bilateral below knee amputation (HCC) 07/26/2016    Past Surgical History:  Procedure Laterality Date  . BELOW KNEE LEG AMPUTATION Bilateral   . CORONARY ARTERY BYPASS GRAFT    . PROSTATE SURGERY    . SHOULDER SURGERY         Home Medications    Prior to Admission medications   Medication Sig Start Date End Date Taking? Authorizing Provider  allopurinol (ZYLOPRIM) 300 MG tablet Take 300 mg by mouth daily.   Yes [provider]  amLODipine (NORVASC) 10 MG tablet Take 10 mg by mouth daily.   Yes [provider]  apixaban (ELIQUIS) 2.5 MG TABS tablet Take 1 tablet (2.5 mg total) by mouth 2 (two) times daily. 07/28/16  Yes Calvert Cantor, MD  hydrALAZINE (APRESOLINE) 25 MG tablet Take 1 tablet (25 mg total) by mouth every 8 (eight) hours. 07/28/16  Yes Calvert Cantor, MD  insulin aspart (NOVOLOG) 100 UNIT/ML injection Inject 8 Units into the skin 2 (two) times daily with a meal. Take with breakfast and dinner   Yes [provider]  insulin detemir (LEVEMIR) 100 UNIT/ML injection Inject 12-14 Units into the skin 2 (two) times daily. Inject 14 units in the morning and Inject 12 units in the evening.   Yes [provider]  linagliptin (TRADJENTA) 5 MG TABS tablet Take 5 mg by mouth daily.   Yes [provider]  metoprolol tartrate (LOPRESSOR) 25 MG tablet Take 0.5 tablets (12.5 mg total) by mouth 2 (two) times daily. 07/28/16  Yes Calvert Cantor, MD  minoxidil (LONITEN) 2.5 MG tablet Take 2.5 mg by mouth 2 (two) times daily.   Yes [provider]  simvastatin (ZOCOR) 40 MG tablet Take 40 mg by mouth every evening.   Yes [provider]  doxycycline (DORYX) 100 MG EC tablet Take 100 mg by mouth 2 (two) times daily. For 7 Days. ABT Start Date 07/25/16 & End Date 08/01/16.    [provider]  polyethylene glycol (MIRALAX / GLYCOLAX) packet Take 17 g  by mouth daily as needed for moderate constipation.    [provider]    Family History Family History  Problem Relation Age of Onset  . Heart attack Father     Social History Social History  Substance Use Topics  . Smoking status: Former Smoker    Years: 20.00  . Smokeless tobacco: Never Used  . Alcohol use No     Allergies   Patient has no known allergies.   Review of Systems Review of Systems  Unable to perform ROS: Severe respiratory distress  Constitutional: Positive for appetite change and fever. Negative for chills and diaphoresis.  Respiratory: Positive for shortness of  breath and wheezing.   Cardiovascular: Negative for chest pain and palpitations.  Gastrointestinal: Positive for abdominal distention and abdominal pain. Negative for diarrhea, nausea and vomiting.  Musculoskeletal: Negative for myalgias, neck pain and neck stiffness.  Skin: Negative for color change, pallor and rash.     Physical Exam Updated Vital Signs BP (!) 146/60   Pulse (!) 118   Temp (!) 101.4 F (38.6 C) (Rectal)   Resp (!) 41   Wt 90.3 kg (199 lb)   SpO2 100%   BMI 32.12 kg/m   Physical Exam  Constitutional: He appears well-developed and well-nourished. He appears distressed.  Febrile, respiratory distress, speaking in 3 word sentences  HENT:  Head: Normocephalic and atraumatic.  Neck: Normal range of motion.  Cardiovascular: Normal rate and normal heart sounds.   No murmur heard. Pulmonary/Chest: He is in respiratory distress. He has wheezes. He exhibits no tenderness.  Abdominal: Soft. He exhibits distension. There is tenderness.  Musculoskeletal: He exhibits no edema.  Neurological: He is alert.  Skin: Skin is warm and dry. He is not diaphoretic.  Psychiatric: He has a normal mood and affect.  Nursing note and vitals reviewed.    ED Treatments / Results  Labs (all labs ordered are listed, but only abnormal results are displayed) Labs Reviewed  COMPREHENSIVE METABOLIC PANEL - Abnormal; Notable for the following:       Result Value   CO2 17 (*)    Glucose, Bld 232 (*)    BUN 59 (*)    Creatinine, Ser 2.38 (*)    Total Bilirubin 1.3 (*)    GFR calc non Af Amer 23 (*)    GFR calc Af Amer 27 (*)    All other components within normal limits  CBC WITH DIFFERENTIAL/PLATELET - Abnormal; Notable for the following:    WBC 11.7 (*)    RBC 3.19 (*)    Hemoglobin 9.7 (*)    HCT 29.5 (*)    RDW 16.5 (*)    Platelets 139 (*)    Neutro Abs 10.0 (*)    All other components within normal limits  BRAIN NATRIURETIC PEPTIDE - Abnormal; Notable for the following:      B Natriuretic Peptide 907.9 (*)    All other components within normal limits  I-STAT TROPOININ, ED - Abnormal; Notable for the following:    Troponin i, poc 0.15 (*)    All other components within normal limits  I-STAT CHEM 8, ED - Abnormal; Notable for the following:    BUN 55 (*)    Creatinine, Ser 2.40 (*)    Glucose, Bld 238 (*)    Hemoglobin 9.9 (*)    HCT 29.0 (*)    All other components within normal limits  I-STAT VENOUS BLOOD GAS, ED - Abnormal; Notable for the following:  pCO2, Ven 32.2 (*)    pO2, Ven 99.0 (*)    Bicarbonate 17.0 (*)    Acid-base deficit 8.0 (*)    All other components within normal limits  I-STAT ARTERIAL BLOOD GAS, ED - Abnormal; Notable for the following:    pH, Arterial 7.201 (*)    pO2, Arterial 170.0 (*)    Bicarbonate 18.0 (*)    Acid-base deficit 9.0 (*)    All other components within normal limits  LIPASE, BLOOD  BLOOD GAS, ARTERIAL  I-STAT CG4 LACTIC ACID, ED    EKG  EKG Interpretation None       Radiology Dg Chest Port 1 View  Result Date: 08/11/2016 CLINICAL DATA:  Dyspnea,  tachypnea and expiratory wheeze. EXAM: PORTABLE CHEST 1 VIEW COMPARISON:  07/26/2016 FINDINGS: Status post CABG with median sternotomy sutures. Stable cardiomegaly. Atherosclerosis of the aortic arch without aneurysm. Diffuse interstitial edema with superimposed areas of bilateral airspace opacity raise concern for CHF. Superimposed pneumonia is not entirely excluded given smaller focus of confluent airspace opacity in the left mid lung. There may be a new loop reporting type device projecting over the left heart border. IMPRESSION: 1. Cardiomegaly with post CABG change. 2. Aortic atherosclerosis. 3. Mild interstitial pulmonary edema. Superimposed pneumonia especially in the left mid lung is not entirely excluded though more likely related to CHF. Electronically Signed   By: Tollie Eth M.D.   On: 08/11/2016 18:01   Dg Abd Portable 1 View  Result Date:  08/11/2016 CLINICAL DATA:  Abdominal distention. Last bowel movement 4 days ago. EXAM: PORTABLE ABDOMEN - 1 VIEW COMPARISON:  07/26/2016 FINDINGS: The periphery of the right hemiabdomen is excluded on current exam due to body habitus. No significant retention of stool. Moderate gaseous distention of large bowel without definite source of mechanical obstruction. Superimposed gas containing small bowel loops are noted. No conclusive evidence for bowel obstruction. There is no free air, organomegaly nor suspicious calculi. There is lumbar spondylitic change with multilevel disc space narrowing and osteophyte formation as before. IMPRESSION: Nonobstructive gas pattern.  Lumbar spondylosis. Electronically Signed   By: Tollie Eth M.D.   On: 08/11/2016 17:58    Procedures Procedures (including critical care time)  Medications Ordered in ED Medications  albuterol (PROVENTIL) (2.5 MG/3ML) 0.083% nebulizer solution 5 mg (5 mg Nebulization New Bag/Given 08/11/16 1808)  ceFEPIme (MAXIPIME) 1 g in dextrose 5 % 50 mL IVPB (1 g Intravenous New Bag/Given 08/11/16 1930)  vancomycin (VANCOCIN) 2,000 mg in sodium chloride 0.9 % 500 mL IVPB (not administered)  vancomycin (VANCOCIN) IVPB 1000 mg/200 mL premix (not administered)  ipratropium (ATROVENT) nebulizer solution 0.5 mg (0.5 mg Nebulization Given 08/11/16 1808)  furosemide (LASIX) injection 40 mg (40 mg Intravenous Given 08/11/16 1921)     Initial Impression / Assessment and Plan / ED Course  I have reviewed the triage vital signs and the nursing notes.  Pertinent labs & imaging results that were available during my care of the patient were reviewed by me and considered in my medical decision making (see chart for details).     Patient presenting Febrile with acute respiratory distress, abdominal distention and pain. Chest x-ray with evidence of pulmonary edema and possible superimposed pneumonia on the left. Abdominal film with pattern of nonobstructive gas.  Patient hasn't had a bowel movement since last week but has been passing gas. He has not been eating for a week and has been on liquid diet consisting of ensure, water, juices and coffee.  Patient  is respiratory acidosis. Elevated BNP. Troponin 0.15  Patient was put on BiPAP and given 40 mg IV Lasix. On reassessment patient had improved. Will cover patient for HCAP, given recent admission.  Last ECHO 07/27/2016:  - Left ventricle: The cavity size was normal. Wall thickness was   increased in a pattern of moderate LVH. Systolic function was   normal. The estimated ejection fraction was in the range of 60%   to 65%. Wall motion was normal; there were no regional wall   motion abnormalities. Doppler parameters are consistent with   restrictive physiology, indicative of decreased left ventricular   diastolic compliance and/or increased left atrial pressure.   Doppler parameters are consistent with high ventricular filling   pressure. - Mitral valve: Calcified annulus. - Left atrium: The atrium was mildly dilated.  Impressions:  - Normal LV systolic function; moderate LVH; restrictive filling   with elevated LV filling pressure; mild LAE.  Consulted medicine for admission   Spoke to Dr. Katrinka BlazingSmith who will be admitting patient to stepdown unit.  Final Clinical Impressions(s) / ED Diagnoses   Final diagnoses:  Shortness of breath  Respiratory acidosis  Generalized abdominal pain    New Prescriptions New Prescriptions   No medications on file     Gregary CromerMitchell, Trent Theisen B, PA-C 08/11/16 2234    Benjiman CorePickering, Nathan, MD 08/11/16 [phone]

## 2016-08-11 NOTE — H&P (Signed)
History and Physical    Tony Mcclure JYN:[id number] DOB: 03/30/1928 DOA: 08/11/2016  Referring MD/NP/PA:Jessica Donney Rankins, PA PCP: [name], MD  Patient coming from: Home  Chief Complaint: Shortness of breath  HPI: Tony Mcclure is a 83 y.o. male with medical history significant of HTN, CAD, DM type II, CKD IV, A. fib on anticoagulation of Eliquis; who presents with acute shortness of breath. Patient was just recently hospitalized from 5/19-5/21 for new-onset A. fib and symptomatic anemia which the patient received 2 units of PRBCs and was started on Eliquis. After patient was discharged on home with his daughter but they noted that he never fully recovered. Patient complained of abdominal distention and pain the last 2 weeks. He has been eating basically liquid diet for the last week. Last bowel movement was sometime last week. Daughter also notes that he was diagnosed with bronchitis and had been treated with doxycycline until the 5/25. Associated symptoms include cough that he reports is minimally productive and generalized feelings of retaining fluid. He did not recently checked his weight. Denies having any fever, chills, vomiting, dysuria, chest pain, or loss of consciousness. Today when shortness of breath symptoms worsen patient denies tried anything to relieve symptoms. En route with EMS patient was noted to have O2 saturations as low as 86% on room air.  ED Course: Upon admission to the emergency room patient was placed on BiPAP for significant respiratory distress. Initial ABG revealed a pH of 7.201, PCO2 46.8,and PO2 170. Patient's vital signs included temperature 101.4, pulse up to 127, respirations of 241, blood pressure 170/53, and O2 saturations maintained on BiPAP. Labs revealed WBC 11.7, hemoglobin 9.7, platelets 139, CO2 17, BUN 59, creatinine 2.38, glucose 232, BNP 907.9, troponin 0.15. Chest x-ray showed pulmonary edema with possible left mid lobe pneumonia. Sepsis  protocol was initiated and patient was started on empiric antibiotics of vancomycin and cefepime along with given DuoNeb. TRH called to admit.  Review of Systems: As per HPI otherwise 10 point review of systems negative.   Past Medical History:  Diagnosis Date  . BPH (benign prostatic hyperplasia)   . CAD (coronary artery disease)   . Diabetes mellitus without complication (HCC)   . Hypertension   . Kidney disease   . S/P bilateral BKA (below knee amputation) [facility])     Past Surgical History:  Procedure Laterality Date  . BELOW KNEE LEG AMPUTATION Bilateral   . CORONARY ARTERY BYPASS GRAFT    . PROSTATE SURGERY    . SHOULDER SURGERY       reports that he has quit smoking. He quit after 20.00 years of use. He has never used smokeless tobacco. He reports that he does not drink alcohol or use drugs.  No Known Allergies  Family History  Problem Relation Age of Onset  . Heart attack Father     Prior to Admission medications   Medication Sig Start Date End Date Taking? Authorizing Provider  allopurinol (ZYLOPRIM) 300 MG tablet Take 300 mg by mouth daily.   Yes [provider]  amLODipine (NORVASC) 10 MG tablet Take 10 mg by mouth daily.   Yes [provider]  apixaban (ELIQUIS) 2.5 MG TABS tablet Take 1 tablet (2.5 mg total) by mouth 2 (two) times daily. 07/28/16  Yes Calvert Cantor, MD  hydrALAZINE (APRESOLINE) 25 MG tablet Take 1 tablet (25 mg total) by mouth every 8 (eight) hours. 07/28/16  Yes Calvert Cantor, MD  insulin aspart (NOVOLOG) 100 UNIT/ML injection Inject 8 Units  into the skin 2 (two) times daily with a meal. Take with breakfast and dinner   Yes [provider]  insulin detemir (LEVEMIR) 100 UNIT/ML injection Inject 12-14 Units into the skin 2 (two) times daily. Inject 14 units in the morning and Inject 12 units in the evening.   Yes [provider]  linagliptin (TRADJENTA) 5 MG TABS tablet Take 5 mg by mouth daily.   Yes [provider]  metoprolol tartrate (LOPRESSOR) 25 MG tablet Take 0.5 tablets (12.5 mg total) by mouth 2 (two) times daily. 07/28/16  Yes Calvert Cantor, MD  minoxidil (LONITEN) 2.5 MG tablet Take 2.5 mg by mouth 2 (two) times daily.   Yes [provider]  simvastatin (ZOCOR) 40 MG tablet Take 40 mg by mouth every evening.   Yes [provider]  doxycycline (DORYX) 100 MG EC tablet Take 100 mg by mouth 2 (two) times daily. For 7 Days. ABT Start Date 07/25/16 & End Date 08/01/16.    [provider]  polyethylene glycol (MIRALAX / GLYCOLAX) packet Take 17 g by mouth daily as needed for moderate constipation.    [provider]    Physical Exam:    Constitutional: Elderly male in moderate respiratory distress Vitals:   08/11/16 1830 08/11/16 1845 08/11/16 1858 08/11/16 1900  BP: (!) 166/66 (!) 175/59  (!) 146/60  Pulse: (!) 120 (!) 116  (!) 118  Resp: (!) 39 (!) 35  (!) 41  Temp:      TempSrc:      SpO2: 100% 100%  100%  Weight:   90.3 kg (199 lb)    Eyes: PERRL, lids and conjunctivae normal ENMT: Mucous membranes are dry. Posterior pharynx clear of any exudate or lesions.  Neck: normal, supple, no masses, no thyromegaly Respiratory: Tachypneic with expiratory rhonchi appreciated. Patient talking in short sentences with mild accessory muscle usage. Cardiovascular: Irregular irregular. No appreciable extremity edema. 2+ pedal pulses. No carotid bruits.  Abdomen: no tenderness, no masses palpated. No hepatosplenomegaly. Bowel sounds positive.  Musculoskeletal: no clubbing / cyanosis. Bilateral below-the-knee amputations. Skin: no rashes, lesions, ulcers. No induration Neurologic: CN 2-12 grossly intact. Sensation intact, DTR normal. Strength 5/5 in all 4.  Psychiatric: Normal judgment and insight. Alert and oriented x 3. Normal mood.     Labs on Admission: I have personally reviewed following labs and imaging studies  CBC:  Recent Labs Lab  08/11/16 1727 08/11/16 1743  WBC 11.7*  --   NEUTROABS 10.0*  --   HGB 9.7* 9.9*  HCT 29.5* 29.0*  MCV 92.5  --   PLT 139*  --    Basic Metabolic Panel:  Recent Labs Lab 08/11/16 1727 08/11/16 1743  NA 138 138  K 4.0 4.0  CL 106 108  CO2 17*  --   GLUCOSE 232* 238*  BUN 59* 55*  CREATININE 2.38* 2.40*  CALCIUM 9.4  --    GFR: Estimated Creatinine Clearance: 23.3 mL/min (A) (by C-G formula based on SCr of 2.4 mg/dL (H)). Liver Function Tests:  Recent Labs Lab 08/11/16 1727  AST 21  ALT 23  ALKPHOS 89  BILITOT 1.3*  PROT 6.5  ALBUMIN 3.7    Recent Labs Lab 08/11/16 1727  LIPASE 17   No results for input(s): AMMONIA in the last 168 hours. Coagulation Profile: No results for input(s): INR, PROTIME in the last 168 hours. Cardiac Enzymes: No results for input(s): CKTOTAL, CKMB, CKMBINDEX, TROPONINI in the last 168 hours. BNP (last 3  results) No results for input(s): PROBNP in the last 8760 hours. HbA1C: No results for input(s): HGBA1C in the last 72 hours. CBG: No results for input(s): GLUCAP in the last 168 hours. Lipid Profile: No results for input(s): CHOL, HDL, LDLCALC, TRIG, CHOLHDL, LDLDIRECT in the last 72 hours. Thyroid Function Tests: No results for input(s): TSH, T4TOTAL, FREET4, T3FREE, THYROIDAB in the last 72 hours. Anemia Panel: No results for input(s): VITAMINB12, FOLATE, FERRITIN, TIBC, IRON, RETICCTPCT in the last 72 hours. Urine analysis: No results found for: COLORURINE, APPEARANCEUR, LABSPEC, PHURINE, GLUCOSEU, HGBUR, BILIRUBINUR, KETONESUR, PROTEINUR, UROBILINOGEN, NITRITE, LEUKOCYTESUR Sepsis Labs: No results found for this or any previous visit (from the past 240 hour(s)).   Radiological Exams on Admission: Dg Chest Port 1 View  Result Date: 08/11/2016 CLINICAL DATA:  Dyspnea,  tachypnea and expiratory wheeze. EXAM: PORTABLE CHEST 1 VIEW COMPARISON:  07/26/2016 FINDINGS: Status post CABG with median sternotomy sutures. Stable  cardiomegaly. Atherosclerosis of the aortic arch without aneurysm. Diffuse interstitial edema with superimposed areas of bilateral airspace opacity raise concern for CHF. Superimposed pneumonia is not entirely excluded given smaller focus of confluent airspace opacity in the left mid lung. There may be a new loop reporting type device projecting over the left heart border. IMPRESSION: 1. Cardiomegaly with post CABG change. 2. Aortic atherosclerosis. 3. Mild interstitial pulmonary edema. Superimposed pneumonia especially in the left mid lung is not entirely excluded though more likely related to CHF. Electronically Signed   By: Tollie Eth M.D.   On: 08/11/2016 18:01   Dg Abd Portable 1 View  Result Date: 08/11/2016 CLINICAL DATA:  Abdominal distention. Last bowel movement 4 days ago. EXAM: PORTABLE ABDOMEN - 1 VIEW COMPARISON:  07/26/2016 FINDINGS: The periphery of the right hemiabdomen is excluded on current exam due to body habitus. No significant retention of stool. Moderate gaseous distention of large bowel without definite source of mechanical obstruction. Superimposed gas containing small bowel loops are noted. No conclusive evidence for bowel obstruction. There is no free air, organomegaly nor suspicious calculi. There is lumbar spondylitic change with multilevel disc space narrowing and osteophyte formation as before. IMPRESSION: Nonobstructive gas pattern.  Lumbar spondylosis. Electronically Signed   By: Tollie Eth M.D.   On: 08/11/2016 17:58    EKG: Independently reviewed. Atrial fibrillation at 127 bpm  Assessment/Plan Sepsis 2/2 HCAP acute. Patient presents with fever up to 101.82F, respirations 30's, pulse 127, and WBC 11.7. Lactic acid reassuring at 1.15. Chest x-ray showing pulmonary edema with signs of left lmild lobe infiltrate. Patient placed on empiric antibiotics of vancomycin and cefepime. - Admit stepdown  - Sepsis protocol initiated - Follow-up blood and sputum cultures -  Continue empiric antibiotics of vancomycin and cefepime  Acute respiratory failure 2/2 diastolic CHF exacerbation: Acute. Patient presents with shortness of breath requiring BiPAP. Chest x-ray showing pulmonary edema proBNP elevated at 907.9. Patient given 40 mg of Lasix IV in the emergency department. Patient's recent echocardiogram showed EF of 60-65% on 5/20. - Heart failure protocol initiated - Continuous pulse oximetry with nasal cannula oxygenas needed to keep O2 saturations >92% - Bipap prn - DuoNeb's as needed - Strict I&Os and daily weights - Elevate lower extremities - Lasix 40 mg IV Bid - Reassess in a.m. and adjust diuresis as needed. - Check echocardiogram - Optimize medical management as able - May warrant consultation to cardiology in a.m.   A. Fib: New onset sincelast hospitalization on 5/19. CHA2DS2-VASc Score= 5 - Continue metoprolol and Eliquis  Elevated troponin:  Acute. Troponin on admission 0.14. Previously, troponin was noted to be elevated up to 0.39 during last hospitalization. Patient denies any active chest pain complaints at this time. - Trend cardiac troponin  Abdominal distention - Simethicone prn bloat   Normocytic normochromic anemia:  Hemoglobin previously had been 10.4 on 5/21, patient now presents with hemoglobin 9.7. - Continue to monitor  Essential hypertension - Continue amlodipine, hydralazine, metoprolol  Diabetes mellitus type 2 - Hypoglycemic protocol - Held linagliptin - Continue home regimen of NovoLog and Levemir - CBGs every before meals and HS  Hyperlipidemia - Continue Zocor  B/L below knee amputee: Chronic. Patient uses prosthetics the assistance of walker to ambulate short distances at baseline.   Thrombocytopenia. acute chronic: Platelets 139 on admission  DVT prophylaxis: Eliquis Code Status:DNR Family Communication: Discussed plan of care with the patient and family present at bedside. Disposition Plan: Likely discharge  home once medically Consults called: none Admission status: Inpatient   Clydie Braunondell A Burnie Therien MD [facility] Pager [phone]- [phone]  If 7PM-7AM, please contact night-coverage [email or web address] Password [email or web address]  08/11/2016, 7:27 PM

## 2016-08-11 NOTE — Progress Notes (Addendum)
Pharmacy Antibiotic Note  Tony Mcclure is a 83 y.o. male admitted on 08/11/2016 with pneumonia. Pharmacy has been consulted for vancomycin dosing. Patient is febrile with temperature 101.4 and WBC elevated at 11.7. SCr is 2.40 (appears to be near baseline) with estimated CrCl~ 20 ml/min. Of note, patient does have bilateral BKAs.   Plan: Vancomycin 2g IV x1, then 1g IV q24hr  Cefepime 1g x1 per MD  Monitor renal function, clinical picture, and cultures [facility] and [facility]  Follow-up length of therapy   Temp (24hrs), Avg:100.9 F (38.3 C), Min:100.4 F (38 C), Max:101.4 F (38.6 C)   Recent Labs Lab 08/11/16 1727 08/11/16 1743 08/11/16 1744  WBC 11.7*  --   --   CREATININE 2.38* 2.40*  --   LATICACIDVEN  --   --  1.15    Estimated Creatinine Clearance: 23.3 mL/min (A) (by C-G formula based on SCr of 2.4 mg/dL (H)).    No Known Allergies  Antimicrobials this admission: 6/4 Vanc >>  6/4 Cefepime x1   Dose adjustments this admission: n/a  Microbiology results: Pending   York CeriseKatherine Cook, PharmD Pharmacy Resident  Pager [phone] 08/11/16 6:56 PM

## 2016-08-11 NOTE — ED Triage Notes (Signed)
Per [facility], Pt from home. Pt complaining of SHOB. Pt has had some chest pain for a week. Pt also reports some abdominal pain this week. Pt currently denies pain. Pt has a distended abdomen. Pt tachypnic, dyspnea at rest with expiratory wheezing bilaterally. Pt placed on 2L O2., O2 sats 86% on room air.

## 2016-08-12 DIAGNOSIS — I5033 Acute on chronic diastolic (congestive) heart failure: Secondary | ICD-10-CM

## 2016-08-12 DIAGNOSIS — I4891 Unspecified atrial fibrillation: Secondary | ICD-10-CM

## 2016-08-12 DIAGNOSIS — J181 Lobar pneumonia, unspecified organism: Secondary | ICD-10-CM

## 2016-08-12 DIAGNOSIS — N184 Chronic kidney disease, stage 4 (severe): Secondary | ICD-10-CM

## 2016-08-12 DIAGNOSIS — J189 Pneumonia, unspecified organism: Secondary | ICD-10-CM

## 2016-08-12 DIAGNOSIS — I480 Paroxysmal atrial fibrillation: Secondary | ICD-10-CM

## 2016-08-12 DIAGNOSIS — D696 Thrombocytopenia, unspecified: Secondary | ICD-10-CM | POA: Diagnosis present

## 2016-08-12 DIAGNOSIS — A419 Sepsis, unspecified organism: Principal | ICD-10-CM

## 2016-08-12 DIAGNOSIS — D508 Other iron deficiency anemias: Secondary | ICD-10-CM

## 2016-08-12 DIAGNOSIS — I10 Essential (primary) hypertension: Secondary | ICD-10-CM

## 2016-08-12 HISTORY — DX: Pneumonia, unspecified organism: J18.9

## 2016-08-12 LAB — GLUCOSE, CAPILLARY
GLUCOSE-CAPILLARY: 205 mg/dL — AB (ref 65–99)
GLUCOSE-CAPILLARY: 68 mg/dL (ref 65–99)
GLUCOSE-CAPILLARY: 166 mg/dL — AB (ref 65–99)
Glucose-Capillary: 124 mg/dL — ABNORMAL HIGH (ref 65–99)
Glucose-Capillary: 97 mg/dL (ref 65–99)

## 2016-08-12 LAB — CBC WITH DIFFERENTIAL/PLATELET
Basophils Absolute: 0 10*3/uL (ref 0.0–0.1)
Basophils Relative: 0 %
EOS PCT: 0 %
Eosinophils Absolute: 0 10*3/uL (ref 0.0–0.7)
HCT: 27.4 % — ABNORMAL LOW (ref 39.0–52.0)
Hemoglobin: 8.7 g/dL — ABNORMAL LOW (ref 13.0–17.0)
LYMPHS ABS: 0.9 10*3/uL (ref 0.7–4.0)
Lymphocytes Relative: 13 %
MCH: 29.2 pg (ref 26.0–34.0)
MCHC: 31.8 g/dL (ref 30.0–36.0)
MCV: 91.9 fL (ref 78.0–100.0)
MONOS PCT: 10 %
Monocytes Absolute: 0.7 10*3/uL (ref 0.1–1.0)
Neutro Abs: 5.3 10*3/uL (ref 1.7–7.7)
Neutrophils Relative %: 77 %
PLATELETS: 125 10*3/uL — AB (ref 150–400)
RBC: 2.98 MIL/uL — AB (ref 4.22–5.81)
RDW: 16.4 % — ABNORMAL HIGH (ref 11.5–15.5)
WBC: 6.9 10*3/uL (ref 4.0–10.5)

## 2016-08-12 LAB — BASIC METABOLIC PANEL
Anion gap: 13 (ref 5–15)
BUN: 63 mg/dL — ABNORMAL HIGH (ref 6–20)
CALCIUM: 9.1 mg/dL (ref 8.9–10.3)
CO2: 20 mmol/L — AB (ref 22–32)
CREATININE: 2.52 mg/dL — AB (ref 0.61–1.24)
Chloride: 106 mmol/L (ref 101–111)
GFR, EST AFRICAN AMERICAN: 25 mL/min — AB (ref >60)
GFR, EST NON AFRICAN AMERICAN: 22 mL/min — AB (ref >60)
Glucose, Bld: 276 mg/dL — ABNORMAL HIGH (ref 65–99)
Potassium: 3.9 mmol/L (ref 3.5–5.1)
SODIUM: 139 mmol/L (ref 135–145)

## 2016-08-12 LAB — STREP PNEUMONIAE URINARY ANTIGEN: Strep Pneumo Urinary Antigen: NEGATIVE

## 2016-08-12 LAB — URINALYSIS, ROUTINE W REFLEX MICROSCOPIC
Bilirubin Urine: NEGATIVE
Glucose, UA: NEGATIVE mg/dL
Ketones, ur: NEGATIVE mg/dL
Leukocytes, UA: NEGATIVE
Nitrite: NEGATIVE
Protein, ur: 100 mg/dL — AB
SPECIFIC GRAVITY, URINE: 1.011 (ref 1.005–1.030)
pH: 6 (ref 5.0–8.0)

## 2016-08-12 LAB — CBC AND DIFFERENTIAL
HCT: 27 — AB (ref 41–53)
Hemoglobin: 8.7 — AB (ref 13.5–17.5)
Platelets: 125 — AB (ref 150–399)
WBC: 6.9

## 2016-08-12 LAB — TROPONIN I
TROPONIN I: 0.76 ng/mL — AB (ref <0.03)
Troponin I: 0.71 ng/mL (ref <0.03)
Troponin I: 0.76 ng/mL (ref <0.03)

## 2016-08-12 LAB — MRSA PCR SCREENING: MRSA BY PCR: NEGATIVE

## 2016-08-12 MED ORDER — METOPROLOL TARTRATE 25 MG PO TABS
25.0000 mg | ORAL_TABLET | Freq: Two times a day (BID) | ORAL | Status: DC
  Administered 2016-08-12 – 2016-08-18 (×12): 25 mg via ORAL
  Filled 2016-08-12 (×13): qty 1

## 2016-08-12 MED ORDER — BISACODYL 10 MG RE SUPP
10.0000 mg | Freq: Once | RECTAL | Status: AC
  Administered 2016-08-12: 10 mg via RECTAL
  Filled 2016-08-12: qty 1

## 2016-08-12 MED ORDER — DEXTROSE 5 % IV SOLN
1.0000 g | INTRAVENOUS | Status: DC
  Administered 2016-08-12 – 2016-08-17 (×6): 1 g via INTRAVENOUS
  Filled 2016-08-12 (×7): qty 1

## 2016-08-12 MED ORDER — INSULIN ASPART 100 UNIT/ML ~~LOC~~ SOLN
5.0000 [IU] | SUBCUTANEOUS | Status: AC
  Administered 2016-08-12: 5 [IU] via SUBCUTANEOUS

## 2016-08-12 MED ORDER — INSULIN DETEMIR 100 UNIT/ML ~~LOC~~ SOLN
14.0000 [IU] | Freq: Every day | SUBCUTANEOUS | Status: DC
  Filled 2016-08-12: qty 0.14

## 2016-08-12 MED ORDER — INSULIN DETEMIR 100 UNIT/ML ~~LOC~~ SOLN
6.0000 [IU] | Freq: Every day | SUBCUTANEOUS | Status: DC
  Administered 2016-08-12: 6 [IU] via SUBCUTANEOUS
  Filled 2016-08-12: qty 0.06

## 2016-08-12 MED ORDER — FUROSEMIDE 10 MG/ML IJ SOLN
80.0000 mg | Freq: Two times a day (BID) | INTRAMUSCULAR | Status: AC
  Administered 2016-08-12: 80 mg via INTRAVENOUS
  Filled 2016-08-12: qty 8

## 2016-08-12 NOTE — Progress Notes (Signed)
Inpatient Diabetes Program Recommendations  AACE/ADA: New Consensus Statement on Inpatient Glycemic Control (2015)  Target Ranges:  Prepandial:   less than 140 mg/dL      Peak postprandial:   less than 180 mg/dL (1-2 hours)      Critically ill patients:  140 - 180 mg/dL   Lab Results  Component Value Date   GLUCAP 124 (H) 08/12/2016    Review of Glycemic Control Results for Tony DoomCRANE, HERSHEL (MRN [id number]) as of 08/12/2016 15:08  Ref. Range 07/28/2016 07:24 07/28/2016 11:38 08/11/2016 23:05 08/12/2016 07:39 08/12/2016 11:45  Glucose-Capillary Latest Ref Range: 65 - 99 mg/dL 409139 (H) 811194 (H) 914305 (H) 205 (H) 124 (H)   Diabetes history: DM2 Outpatient Diabetes medications: Levemir 14 units am + 12 units pm + Novolog 8 units bid Current orders for Inpatient glycemic control:  Levemir 14 units am + 12 units pm + Novolog 8 units bid + Novolog correction 0-5 units hs   Inpatient Diabetes Program Recommendations:  Noted pending A1c. Please consider: -Decrease Novolog meal coverage to 3-4 units tid if eats 50% Will follow.  Thank you, Billy FischerJudy E. Doyle Kunath, RN, MSN, CDE  Diabetes Coordinator Inpatient Glycemic Control Team Team Pager [phone] (8am-5pm) 08/12/2016 3:14 PM

## 2016-08-12 NOTE — Care Management Note (Signed)
Case Management Note  Patient Details  Name: Tony Mcclure MRN: [id number] Date of Birth: [birth year]  Subjective/Objective:    From home with daughter, presents with Sepsis, acute resp failure , afib with rvr, iron def anemia , ckd stage 3-4, htn, dm2 , bilateral BKA, (has prosthesis at home with walker).  [facility] [employer] [facility].  PCP     Tony Mcclure        Action/Plan: NCM will follow for dc needs.  Expected Discharge Date:                  Expected Discharge Plan:     In-House Referral:     Discharge planning Services  CM Consult  Post Acute Care Choice:    Choice offered to:     DME Arranged:    DME Agency:     HH Arranged:    HH Agency:     Status of Service:  In process, will continue to follow  If discussed at Long Length of Stay Meetings, dates discussed:    Additional Comments:  Tony Mcclure, Tony Ramone Clinton, RN 08/12/2016, 2:45 PM

## 2016-08-12 NOTE — Progress Notes (Signed)
PT Cancellation Note  Patient Details Name: Tony Mcclure MRN: [id number] DOB: 05/27/1928   Cancelled Treatment:    Reason Eval/Treat Not Completed: Medical issues which prohibited therapy (pt with troponin .76 and not medically appropriate)   Woodson Macha B Raydon Chappuis 08/12/2016, 7:14 AM  Delaney MeigsMaija Tabor Janmichael Giraud, PT [phone]

## 2016-08-12 NOTE — Progress Notes (Signed)
PROGRESS NOTE                                                                                                                                                                                                             Patient Demographics:    Tony Mcclure, is a 83 y.o. male, DOB - 1928/06/06, ZOX:[id number]  Admit date - 08/11/2016   Admitting Physician Clydie Braunondell A Smith, MD  Outpatient Primary MD for the patient is [name], [name], MD  LOS - 1  Outpatient Specialists: Cardiology  Chief Complaint  Patient presents with  . Shortness of Breath       Brief Narrative   83 year old male wit history of chronic kidney disease stage IV,diabetes mellitus with peripheral vascular disease status post bilateral BKA, CAD with history of CABG, hypertension who presents with shortness of breath for past several days. Patient was hospitalized from 5/19-5/21 for symptomatic anemia (received 2 unit PRBC)  new onset rapid A. fib. He was started on eliquis (episodes of tachycardia/ bradycardia with plan on outpatient event monitor). Patient's daughter reported that since his hospital discharge he has not fully recovered. Patient reports abdominal distention and constipation (from last hospitalization it appears this is chronic for him). He was also prescribed oral doxycycline for acute bronchitis (completed on 5/25).   Patient has been having increased shortness of breath with cough and generalized weakness. Denied fevers, chills, nausea, vomiting, bowel or urinary symptoms. EMS was called for increased shortness of breath on the day of admission and was found to have O2 sat of 86% on room air. In the ED he was found to be in hypoxic respiratory failure and placed on BiPAP. He was febrile to 101.70F, in rapid A. fib with heart rate of 127, and tachypnea meeting criteria for sepsis. Blood work showed WBC of 11.7, hemoglobin of 9.7, BUN of 59  2.38, glucose of 232 and BNP of 907 with elevated troponin of 0.15. Chest X essential pulmonary edema with possible left middle lobe pneumonia. Sepsis pathway initiated and patient started on empiric antibiotics along with nebulizer. Admitted to stepdown unit on BiPAP.   Subjective:   Patient off BiPAP early this morning and maintaining O2 sat on 38 of a nasal cannula. Reports his breathing to be better since admission. Reports abdominal discomfort with constipation.   Assessment  & Plan :  Principal Problem:   Sepsis (HCC) Possibly secondary to healthcare associated pneumonia. On empiric IV vancomycin and cefepime. Continue step down monitoring. Off BiPAP this morning. Follow blood cultures and sputum cultures. Nebs when necessary.  Active Problems: Acute hypoxic respiratory failure Secondary to healthcare associated pneumonia and acute CHF exacerbation (possibly diastolic).  recent 2-D echo from 5/20 showing normal EF and no wall motion abnormality. -Start IV fluids. Started on IV Lasix 40 mg twice a day. Strict I/O and daily weight. -New O2 via nasal cannula. BiPAP as needed. Cardiology consulted for further management.  Atrial fibrillation with RVR Newly diagnosed since his recent hospitalization and 5/19. Started on anticoagulation. Continue metoprolol. Heart rate better controlled this morning. I will increase his metoprolol dose to 25 mg twice a day. Cardiology consulted.   Iron deficiency anemia/vitamin B12 deficiency Hemoglobin stable. Transfuse 2 units PRBC on recent hospitalization. Also noted for low normal B12. Add B12 supplement. Needs iron supplement once constipation improves.    Type 2 diabetes mellitus , uncontrolled (HCC) Elevated CBG. Resume home dose Levemir . Increased to twice a day and pre-meal aspart. Check A1c.  Chronic kidney disease stage III-IV Baseline renal function not known. Appears stable from last hospitalization. Monitor while on diuretic.     Essential hypertension Stable. Continue amlodipine, hydralazine and metoprolol.    S/P bilateral below knee amputation (HCC)    Code Status : DO NOT RESUSCITATE  Family Communication  : We will update her daughter  Disposition Plan  : Continue step down monitoring  Barriers For Discharge : Active symptoms  Consults  :   Cardiology  Procedures  : None  DVT Prophylaxis  :  ELIQUIS  Lab Results  Component Value Date   PLT 125 (L) 08/12/2016    Antibiotics  :    Anti-infectives    Start     Dose/Rate Route Frequency Ordered Stop   08/12/16 2000  vancomycin (VANCOCIN) IVPB 1000 mg/200 mL premix     1,000 mg 200 mL/hr over 60 Minutes Intravenous Every 24 hours 08/11/16 1905     08/12/16 1800  ceFEPIme (MAXIPIME) 1 g in dextrose 5 % 50 mL IVPB     1 g 100 mL/hr over 30 Minutes Intravenous Every 24 hours 08/12/16 0847     08/11/16 1930  vancomycin (VANCOCIN) 2,000 mg in sodium chloride 0.9 % 500 mL IVPB     2,000 mg 250 mL/hr over 120 Minutes Intravenous  Once 08/11/16 1905 08/11/16 2203   08/11/16 1845  ceFEPIme (MAXIPIME) 1 g in dextrose 5 % 50 mL IVPB     1 g 100 mL/hr over 30 Minutes Intravenous  Once 08/11/16 1840 08/11/16 2003        Objective:   Vitals:   08/12/16 0500 08/12/16 0600 08/12/16 0700 08/12/16 0730  BP: (!) 120/51 (!) 139/48 (!) 160/75 (!) 149/69  Pulse: (!) 52 77  94  Resp: (!) 31 (!) 32 (!) 25 (!) 26  Temp:    98.6 F (37 C)  TempSrc:    Oral  SpO2: 95% 96%  98%  Weight: 87 kg (191 lb 12.8 oz)     Height:        Wt Readings from Last 3 Encounters:  08/12/16 87 kg (191 lb 12.8 oz)  07/26/16 90.3 kg (198 lb 15.8 oz)     Intake/Output Summary (Last 24 hours) at 08/12/16 0952 Last data filed at 08/12/16 0944  Gross per 24 hour  Intake  56 ml  Output               50 ml  Net                6 ml     Physical Exam  Renal: Elderly male lying in bed appears fatigued, not in distress HEENT: Pallor present, moist mucosa,  JVD +, supple neck Chest: Coarse breath sounds bilaterally with scattered rhonchi CVS: S1 and S2 irregularly irregular, no murmurs rub or gallop GI: Soft, distended with tympanic dullness to percussion, nontender, bowel sounds present Musculoskeletal: Bilateral BKA, no edema over the thighs  CNS: Alert and oriented     Data Review:    CBC  Recent Labs Lab 08/11/16 1727 08/11/16 1743 08/12/16 0346  WBC 11.7*  --  6.9  HGB 9.7* 9.9* 8.7*  HCT 29.5* 29.0* 27.4*  PLT 139*  --  125*  MCV 92.5  --  91.9  MCH 30.4  --  29.2  MCHC 32.9  --  31.8  RDW 16.5*  --  16.4*  LYMPHSABS 0.9  --  0.9  MONOABS 0.8  --  0.7  EOSABS 0.0  --  0.0  BASOSABS 0.0  --  0.0    Chemistries   Recent Labs Lab 08/11/16 1727 08/11/16 1743 08/12/16 0346  NA 138 138 139  K 4.0 4.0 3.9  CL 106 108 106  CO2 17*  --  20*  GLUCOSE 232* 238* 276*  BUN 59* 55* 63*  CREATININE 2.38* 2.40* 2.52*  CALCIUM 9.4  --  9.1  AST 21  --   --   ALT 23  --   --   ALKPHOS 89  --   --   BILITOT 1.3*  --   --    ------------------------------------------------------------------------------------------------------------------ No results for input(s): CHOL, HDL, LDLCALC, TRIG, CHOLHDL, LDLDIRECT in the last 72 hours.  No results found for: HGBA1C ------------------------------------------------------------------------------------------------------------------ No results for input(s): TSH, T4TOTAL, T3FREE, THYROIDAB in the last 72 hours.  Invalid input(s): FREET3 ------------------------------------------------------------------------------------------------------------------ No results for input(s): VITAMINB12, FOLATE, FERRITIN, TIBC, IRON, RETICCTPCT in the last 72 hours.  Coagulation profile No results for input(s): INR, PROTIME in the last 168 hours.  No results for input(s): DDIMER in the last 72 hours.  Cardiac Enzymes  Recent Labs Lab 08/12/16 0346 08/12/16 0839  TROPONINI 0.71*  0.76* 0.76*    ------------------------------------------------------------------------------------------------------------------    Component Value Date/Time   BNP 907.9 (H) 08/11/2016 1727    Inpatient Medications  Scheduled Meds: . amLODipine  10 mg Oral Daily  . apixaban  2.5 mg Oral BID  . furosemide  40 mg Intravenous BID  . hydrALAZINE  25 mg Oral Q8H  . insulin aspart  0-5 Units Subcutaneous QHS  . insulin aspart  8 Units Subcutaneous BID WC  . insulin detemir  14 Units Subcutaneous Daily   And  . insulin detemir  12 Units Subcutaneous QHS  . metoprolol tartrate  12.5 mg Oral BID  . sodium chloride flush  3 mL Intravenous Q12H   Continuous Infusions: . sodium chloride    . albuterol    . ceFEPime (MAXIPIME) IV    . vancomycin     PRN Meds:.sodium chloride, acetaminophen, hydrALAZINE, ipratropium-albuterol, ondansetron (ZOFRAN) IV, simethicone, sodium chloride flush  Micro Results Recent Results (from the past 240 hour(s))  MRSA PCR Screening     Status: None   Collection Time: 08/11/16 10:55 PM  Result Value Ref Range Status   MRSA by PCR  NEGATIVE NEGATIVE Final    Comment:        The GeneXpert MRSA Assay (FDA approved for NASAL specimens only), is one component of a comprehensive MRSA colonization surveillance program. It is not intended to diagnose MRSA infection nor to guide or monitor treatment for MRSA infections.     Radiology Reports Dg Chest 1 View  Result Date: 07/26/2016 CLINICAL DATA:  Anemia, weakness, shortness of breath EXAM: CHEST 1 VIEW COMPARISON:  None. FINDINGS: Cardiomegaly with pulmonary vascular congestion. No frank interstitial edema. Postsurgical changes related to prior CABG. Median sternotomy. IMPRESSION: No evidence of acute cardiopulmonary disease. Electronically Signed   By: Charline Bills M.D.   On: 07/26/2016 22:00   Dg Abd 1 View  Result Date: 07/26/2016 CLINICAL DATA:  Anemia, weakness, shortness of breath EXAM: ABDOMEN - 1 VIEW  COMPARISON:  None. FINDINGS: Nonobstructive bowel gas pattern. Degenerative changes of the lumbar spine. Brachytherapy seeds overlying the prostate. IMPRESSION: Unremarkable abdominal radiograph. Electronically Signed   By: Charline Bills M.D.   On: 07/26/2016 22:02   Dg Chest Port 1 View  Result Date: 08/11/2016 CLINICAL DATA:  Dyspnea,  tachypnea and expiratory wheeze. EXAM: PORTABLE CHEST 1 VIEW COMPARISON:  07/26/2016 FINDINGS: Status post CABG with median sternotomy sutures. Stable cardiomegaly. Atherosclerosis of the aortic arch without aneurysm. Diffuse interstitial edema with superimposed areas of bilateral airspace opacity raise concern for CHF. Superimposed pneumonia is not entirely excluded given smaller focus of confluent airspace opacity in the left mid lung. There may be a new loop reporting type device projecting over the left heart border. IMPRESSION: 1. Cardiomegaly with post CABG change. 2. Aortic atherosclerosis. 3. Mild interstitial pulmonary edema. Superimposed pneumonia especially in the left mid lung is not entirely excluded though more likely related to CHF. Electronically Signed   By: Tollie Eth M.D.   On: 08/11/2016 18:01   Dg Abd Portable 1 View  Result Date: 08/11/2016 CLINICAL DATA:  Abdominal distention. Last bowel movement 4 days ago. EXAM: PORTABLE ABDOMEN - 1 VIEW COMPARISON:  07/26/2016 FINDINGS: The periphery of the right hemiabdomen is excluded on current exam due to body habitus. No significant retention of stool. Moderate gaseous distention of large bowel without definite source of mechanical obstruction. Superimposed gas containing small bowel loops are noted. No conclusive evidence for bowel obstruction. There is no free air, organomegaly nor suspicious calculi. There is lumbar spondylitic change with multilevel disc space narrowing and osteophyte formation as before. IMPRESSION: Nonobstructive gas pattern.  Lumbar spondylosis. Electronically Signed   By: Tollie Eth  M.D.   On: 08/11/2016 17:58    Time Spent in minutes  35   Eddie North M.D on 08/12/2016 at 9:52 AM  Between 7am to 7pm - Pager - [phone]  After 7pm go to [email or web address] - password [email or web address]  [facility] -  Office  [phone]

## 2016-08-12 NOTE — Progress Notes (Signed)
CRITICAL VALUE ALERT  Critical Value:  Troponin 0.76  Date & Time Notied:  6:00 AM  08/12/2016   Provider Notified: Craige CottaKirby, NP  Orders Received/Actions taken: No new orders in absence of chest pain or active symptoms. Will continue to monitor patient for changes.   Noe GensStefanie A Janaye Corp, RN

## 2016-08-12 NOTE — Consult Note (Addendum)
The patient has been seen in conjunction with Berton Bon, NP-C. All aspects of care have been considered and discussed. The patient has been personally interviewed, examined, and all clinical data has been reviewed.   We are consulted because of elevated troponins and concerns about the presence of heart failure. The patient is DO NOT RESUSCITATE.  The patient has some tachypnea but states his breathing is improved.  Rales are heard at the bases bilaterally. Neck veins up to the angle of the jaw while lying at 45-50. There is no peripheral edema (bilateral above-the-knee amputee). The abdomen is distended but nontender. Uncertain if ascites is present or not. O2 saturations on oxygen a running 91%  BNP is 900 chest x-ray reveals possible interstitial edema.  Plan is to use a more aggressive dose of furosemide, 80 mg every 12 hours 2 doses, to treat acute on chronic diastolic heart failure. Track kidney function. No action is warranted concerning the patient's troponin levels. He is not a candidate for angiography or intervention. The elevated troponin is felt to represent demand ischemia and not ACS. Further adjustment in diuretic dose will depend upon response.  Agree with slightly increasing the patient's metoprolol dose to 25 mg twice a day for better rate control of continuous atrial fibrillation. He had previously been on 50 mg twice a day but had excessive bradycardia and pauses during the most recent prior hospitalization.   Cardiology Consult    Patient ID: Tony Mcclure MRN: [id number], DOB/AGE: 1928/12/18   Admit date: 08/11/2016 Date of Consult: 08/12/2016  Primary Physician: [name], MD Primary Cardiologist: New-Dr. Katrinka Blazing  History of Present Illness    Tony Mcclure is a 83 y.o. male history significant for hypertension, CAD S/P CABG, type 2 diabetes, CKD stage IV, atrial fibrillation on anticoagulation with Eliquis, and bilateral BKA who is being seen  today for the evaluation of elevated troponin levels at the request of Dr Gonzella Lex.  The patient had just been hospitalized recently from 5/19-5/21 for new onset of atrial fibrillation and symptomatic anemia for which the patient received 2 units of packed red blood cells and was started on low dose bb and Eliquis. He has presented with acute shortness of breath and was noted to have oxygen saturation as low as 86% while en route via EMS. He says that he has remained short of breath with abdominal fullness and lack of appetite since his recent hospitalization and this did not improve any so his daughter thought that he should return to the hospital. The patient's troponin levels were mildly elevated during his last hospitalization at 0.28 and 0.36. On this hospitalization his troponins have been again mildly elevated in a flat pattern at 0.76, 0.71, 0.76. An EKG on presentation showed continued atrial fibrillation with a rate of 127 bpm and nonspecific intraventricular conduction delay, repolarization abnormality, prob rate related. He denies any chest discomfort, lightheadedness of palpitations. His main concern if for dyspnea, orthopnea and abdominal fullness.   Upon presentation the patient was febrile with temperature of 101.61F, respirations in the 30s, heart rate 127 and WBCs 11.7. He is diagnosed with sepsis secondary to HCAP. He has been placed on empiric antibiotics of vancomycin and cefepime and blood and sputum cultures have been done. He had been placed on PiPAP and now breathing better and on nasal cannula.  He was given 40 mg of IV Lasix in the emergency department and continues on 40 mg IV twice a day.  Chest x-ray  showed cardiomegaly with post CABG changes, aortic atherosclerosis and mild interstitial pulmonary edema. Superimposed pneumonia especially in the left mid lung is not entirely excluded though more likely related to CHF.  His metoprolol has been increased to 25 mg bid and on  telemetry he is currently in atrial fib with rates in the 80's to low 100's. His breathing is improved and abdominal fullness in only somewhat better.   The patient has been wearing an event monitor and on 08/05/16 it was noted that he was in atrial fibrillation with rates in the 60s to 70s. He was to follow-up after his last hospitalization with Dr. Katrinka Blazing on 09/23/16  Past Medical History   Past Medical History:  Diagnosis Date  . BPH (benign prostatic hyperplasia)   . CAD (coronary artery disease)   . Diabetes mellitus without complication (HCC)   . Hypertension   . Kidney disease   . S/P bilateral BKA (below knee amputation) [facility])     Past Surgical History:  Procedure Laterality Date  . BELOW KNEE LEG AMPUTATION Bilateral   . CORONARY ARTERY BYPASS GRAFT    . PROSTATE SURGERY    . SHOULDER SURGERY       Allergies  No Known Allergies  Inpatient Medications    . amLODipine  10 mg Oral Daily  . apixaban  2.5 mg Oral BID  . furosemide  40 mg Intravenous BID  . hydrALAZINE  25 mg Oral Q8H  . insulin aspart  0-5 Units Subcutaneous QHS  . insulin aspart  8 Units Subcutaneous BID WC  . insulin detemir  14 Units Subcutaneous Daily   And  . insulin detemir  12 Units Subcutaneous QHS  . metoprolol tartrate  25 mg Oral BID  . sodium chloride flush  3 mL Intravenous Q12H    Family History    Family History  Problem Relation Age of Onset  . Heart attack Father     Social History    Social History   Social History  . Marital status: Single    Spouse name: N/A  . Number of children: N/A  . Years of education: N/A   Occupational History  . Not on file.   Social History Main Topics  . Smoking status: Former Smoker    Years: 20.00  . Smokeless tobacco: Never Used  . Alcohol use No  . Drug use: No  . Sexual activity: Not on file   Other Topics Concern  . Not on file   Social History Narrative  . No narrative on file     Review of Systems    General:  No  chills, fever, night sweats or weight changes.  Cardiovascular:  Positive for dyspnea on minimal exertion, orthopnea, abdominal fullness (no LE edema) No chest pain, palpitations, paroxysmal nocturnal dyspnea. Dermatological: No rash, lesions/masses Respiratory: Occ cough. DOE Urologic: No hematuria, dysuria Abdominal:   No nausea, vomiting, diarrhea, bright red blood per rectum, melena, or hematemesis. Positive for abdomina fullness, lack of appetite and loose BM Neurologic:  No visual changes, changes in mental status. All other systems reviewed and are otherwise negative except as noted above.  Physical Exam    Blood pressure (!) 150/72, pulse (!) 53, temperature 99.5 F (37.5 C), temperature source Axillary, resp. rate (!) 21, height 5\' 6"  (1.676 m), weight 191 lb 12.8 oz (87 kg), SpO2 96 %.  General: Pleasant, elderly male, NAD Psych: Normal affect. Neuro: Alert and oriented X 3. Moves all extremities spontaneously. HEENT: Normal  Neck: Supple without bruits, +JVD Lungs:  Resp regular and unlabored, fine expiratory wheezes bilaterally  Heart: Irregularly irregular rhythm  no s3, s4, or murmurs. Abdomen: Soft, non-tender, distended, BS + x 4.  Extremities: No clubbing, cyanosis or edema. Bilateral BKA  Labs    Troponin [facility](Point of Care Test)  Recent Labs  08/11/16 1743  TROPIPOC 0.15*    Recent Labs  08/12/16 0346 08/12/16 0839  TROPONINI 0.71*  0.76* 0.76*   Lab Results  Component Value Date   WBC 6.9 08/12/2016   HGB 8.7 (L) 08/12/2016   HCT 27.4 (L) 08/12/2016   MCV 91.9 08/12/2016   PLT 125 (L) 08/12/2016    Recent Labs Lab 08/11/16 1727  08/12/16 0346  NA 138  < > 139  K 4.0  < > 3.9  CL 106  < > 106  CO2 17*  --  20*  BUN 59*  < > 63*  CREATININE 2.38*  < > 2.52*  CALCIUM 9.4  --  9.1  PROT 6.5  --   --   BILITOT 1.3*  --   --   ALKPHOS 89  --   --   ALT 23  --   --   AST 21  --   --   GLUCOSE 232*  < > 276*  < > = values in this interval not  displayed. No results found for: CHOL, HDL, LDLCALC, TRIG No results found for: [facility]   Radiology Studies    Dg Chest 1 View  Result Date: 07/26/2016 CLINICAL DATA:  Anemia, weakness, shortness of breath EXAM: CHEST 1 VIEW COMPARISON:  None. FINDINGS: Cardiomegaly with pulmonary vascular congestion. No frank interstitial edema. Postsurgical changes related to prior CABG. Median sternotomy. IMPRESSION: No evidence of acute cardiopulmonary disease. Electronically Signed   By: Charline BillsSriyesh  Krishnan M.D.   On: 07/26/2016 22:00   Dg Abd 1 View  Result Date: 07/26/2016 CLINICAL DATA:  Anemia, weakness, shortness of breath EXAM: ABDOMEN - 1 VIEW COMPARISON:  None. FINDINGS: Nonobstructive bowel gas pattern. Degenerative changes of the lumbar spine. Brachytherapy seeds overlying the prostate. IMPRESSION: Unremarkable abdominal radiograph. Electronically Signed   By: Charline BillsSriyesh  Krishnan M.D.   On: 07/26/2016 22:02   Dg Chest Port 1 View  Result Date: 08/11/2016 CLINICAL DATA:  Dyspnea,  tachypnea and expiratory wheeze. EXAM: PORTABLE CHEST 1 VIEW COMPARISON:  07/26/2016 FINDINGS: Status post CABG with median sternotomy sutures. Stable cardiomegaly. Atherosclerosis of the aortic arch without aneurysm. Diffuse interstitial edema with superimposed areas of bilateral airspace opacity raise concern for CHF. Superimposed pneumonia is not entirely excluded given smaller focus of confluent airspace opacity in the left mid lung. There may be a new loop reporting type device projecting over the left heart border. IMPRESSION: 1. Cardiomegaly with post CABG change. 2. Aortic atherosclerosis. 3. Mild interstitial pulmonary edema. Superimposed pneumonia especially in the left mid lung is not entirely excluded though more likely related to CHF. Electronically Signed   By: Tollie Ethavid  Kwon M.D.   On: 08/11/2016 18:01   Dg Abd Portable 1 View  Result Date: 08/11/2016 CLINICAL DATA:  Abdominal distention. Last bowel movement 4 days  ago. EXAM: PORTABLE ABDOMEN - 1 VIEW COMPARISON:  07/26/2016 FINDINGS: The periphery of the right hemiabdomen is excluded on current exam due to body habitus. No significant retention of stool. Moderate gaseous distention of large bowel without definite source of mechanical obstruction. Superimposed gas containing small bowel loops are noted. No conclusive evidence for bowel obstruction. There is no  free air, organomegaly nor suspicious calculi. There is lumbar spondylitic change with multilevel disc space narrowing and osteophyte formation as before. IMPRESSION: Nonobstructive gas pattern.  Lumbar spondylosis. Electronically Signed   By: Tollie Eth M.D.   On: 08/11/2016 17:58    EKG & Cardiac Imaging   EKG: atrial fibrillation with rapid ventricular response rate of 127  Echocardiogram 07/27/2016 Study Conclusions  - Left ventricle: The cavity size was normal. Wall thickness was   increased in a pattern of moderate LVH. Systolic function was   normal. The estimated ejection fraction was in the range of 60%   to 65%. Wall motion was normal; there were no regional wall   motion abnormalities. Doppler parameters are consistent with   restrictive physiology, indicative of decreased left ventricular   diastolic compliance and/or increased left atrial pressure.   Doppler parameters are consistent with high ventricular filling   pressure. - Mitral valve: Calcified annulus. - Left atrium: The atrium was mildly dilated.  Impressions:  - Normal LV systolic function; moderate LVH; restrictive filling   with elevated LV filling pressure; mild LAE.   Assessment & Plan    Elevated troponin -troponin levels were mildly elevated during his last hospitalization for atrial fib and anemia at 0.28 and 0.36. On this hospitalization his troponins have been again mildly elevated in a flat pattern at 0.76, 0.71, 0.76.  -BNP is 907.9 -Elevated troponin levels and flat pattern and are likely related to  demand ischemia insetting of heart failure and possible acute sepsis -No chest pain. Would not do further ischemic workup at present. Control heart rate and treat CHF. -Pt states that he has a living will, wants DNR and does not want any extensive interventions.  Heart failure with preserved ejection reaction -Patient admitted with respiratory failure requiring BiPAP. Pt has dyspnea, orthopnea and abdominal fullness (bilat BKA with no edema of the upper legs). Has improved and now on room air. -Recent echo on 07/27/16 showed EF 60-65%, no RWMA, Doppler parameters consistent with restrictive physiology, indicative of decreased left ventricular diastolic compliance and/or increased left atrial pressure. Doppler parameters are consistent with high ventricular filling pressure. -BNP 907.9, chest x-ray shows mild interstitial pulmonary edema with questionable superimposed pneumonia especially in the left midlung which could not entirely be excluded though more likely related to CHF -Has been receiving Lasix 40 mg IV twice a day and weight is down 8 pounds from 199 to 191 pounds. Wt was 198 upon discharge from hospitalization on 5/20 -Discussed with Dr. Katrinka Blazing: Will trial 2 doses of Lasix 80 mg IV to see if can improve pt's breathing and monitor renal function, will likely need diuretic after discharge and close monitoring. -Already has cardiology follow up appt arranged for 7/17 with Dr. Katrinka Blazing.  CKD stage 4 -SCr on admission 2.38 up to 2.52 with diuresis -Monitor  Atrial fibrillation -Patient was recently diagnosed with atrial fibrillation during hospitalization on 5/19. He was started on low-dose metoprolol 12.5 mg twice a day (he had had some symptomatic bradycardia) and rate was controlled at discharge. An event monitor was placed after discharge and he was noted to be in atrial fibrillation with rate control in the 60s and 70s on 5/29. -CHA2DS2/VAS Stroke Risk Score 5 (vascular dz, HTN, Age (2), DM).  He is currently anticoagulated with Eliquis 2.5 mg twice a day (reduced dose due to age and kidney function) -Admission EKG shows atrial fibrillation with rapid ventricular response rate of 127. Currently he is on an increased  dose of metoprolol at 25 mg twice a day and rates are better in the 80's- low 100's. Continue to watch pt on increase dose of beta blocker as infection/respiratory failure may be driving increased heart rate and once improve pt may develop bradycardia again.  -Continue to monitor  Possible Sepsis  -Patient presented with fever, tachypnea, tachycardia, WBCs 11.7, lactic acid reassuring at 1.15, and chest x-ray showing pulmonary edema with signs of left midlung lobe infiltrate. On empiric antibiotics of vancomycin and cefepime. Blood and sputum cultures pending. WBC today 6.9  Anemia -Hemoglobin 9.9 on admission, 8.7 today. Recent hospitalization for anemia with hemoglobin of 8.6 and transfused 2 units of packed red blood cells with resulting hemoglobin of 10.4. -Anemia panel on previous hospitalization revealed anemia of inflammation and chronic disease. Stool was Hemoccult negative. Per IM pt noted to have low normal B12 and B12 supplement ordered.  -Internal medicine is monitoring  Hypertension -Continue amlodipine, hydralazine, metoprolol -Blood pressures have been running from normal to mildly elevated  Hyperlipidemia -Continue Zocor  Diabetes type 2 -On home insulin -Management per hospitalist  Signed, Berton Bon, NP-C 08/12/2016, 12:58 PM Pager: [phone]

## 2016-08-12 NOTE — Progress Notes (Signed)
Pt taken off bipap and placed on 3L [place] at this time. Pt denies SOB, no increased WOB. Pt states his breathing is much better this am. Strong non productive cough noted. RT will continue to monitor.

## 2016-08-12 NOTE — Plan of Care (Signed)
Problem: Education: Goal: Knowledge of [facility] Education information/materials will improve Outcome: Progressing Patient oriented to unit and hospital. Call light within reach and patient instructed on use. Discussed with patient about Bipap use and how he will be on it until the morning. HE voiced understanding.   Problem: Safety: Goal: Ability to remain free from injury will improve Outcome: Progressing Call light within reach and bed alarm on.  Problem: Health Behavior/Discharge Planning: Goal: Ability to manage health-related needs will improve Outcome: Progressing Patient lives at home with daughter. States he is able to ambulate with prosthetics and a walker.   Problem: Skin Integrity: Goal: Risk for impaired skin integrity will decrease Outcome: Progressing Sacral foam placed to protect bottom and barrier cream to buttocks.  Problem: Fluid Volume: Goal: Ability to maintain a balanced intake and output will improve Outcome: Progressing External catheter placed to allow for more accurate measuring of I and O.   Problem: Nutrition: Goal: Adequate nutrition will be maintained Outcome: Progressing Patient NPO while on Bipap- MD placed orders for diet this AM.   Problem: Bowel/Gastric: Goal: Will not experience complications related to bowel motility Outcome: Not Progressing Patient has not had stool in greater than 1 week. Passing only clear mucus from rectum.

## 2016-08-13 DIAGNOSIS — N184 Chronic kidney disease, stage 4 (severe): Secondary | ICD-10-CM | POA: Diagnosis present

## 2016-08-13 DIAGNOSIS — R0602 Shortness of breath: Secondary | ICD-10-CM

## 2016-08-13 DIAGNOSIS — I5031 Acute diastolic (congestive) heart failure: Secondary | ICD-10-CM

## 2016-08-13 LAB — CBC
HCT: 28.3 % — ABNORMAL LOW (ref 39.0–52.0)
Hemoglobin: 9.1 g/dL — ABNORMAL LOW (ref 13.0–17.0)
MCH: 29.3 pg (ref 26.0–34.0)
MCHC: 32.2 g/dL (ref 30.0–36.0)
MCV: 91 fL (ref 78.0–100.0)
PLATELETS: 144 10*3/uL — AB (ref 150–400)
RBC: 3.11 MIL/uL — AB (ref 4.22–5.81)
RDW: 16.4 % — ABNORMAL HIGH (ref 11.5–15.5)
WBC: 8.4 10*3/uL (ref 4.0–10.5)

## 2016-08-13 LAB — LEGIONELLA PNEUMOPHILA SEROGP 1 UR AG: L. pneumophila Serogp 1 Ur Ag: NEGATIVE

## 2016-08-13 LAB — BASIC METABOLIC PANEL
ANION GAP: 15 (ref 5–15)
BUN: 70 mg/dL — ABNORMAL HIGH (ref 6–20)
CALCIUM: 9.1 mg/dL (ref 8.9–10.3)
CO2: 20 mmol/L — ABNORMAL LOW (ref 22–32)
CREATININE: 2.6 mg/dL — AB (ref 0.61–1.24)
Chloride: 103 mmol/L (ref 101–111)
GFR, EST AFRICAN AMERICAN: 24 mL/min — AB (ref >60)
GFR, EST NON AFRICAN AMERICAN: 21 mL/min — AB (ref >60)
Glucose, Bld: 151 mg/dL — ABNORMAL HIGH (ref 65–99)
Potassium: 3.4 mmol/L — ABNORMAL LOW (ref 3.5–5.1)
SODIUM: 138 mmol/L (ref 135–145)

## 2016-08-13 LAB — GLUCOSE, CAPILLARY
GLUCOSE-CAPILLARY: 172 mg/dL — AB (ref 65–99)
GLUCOSE-CAPILLARY: 163 mg/dL — AB (ref 65–99)
Glucose-Capillary: 150 mg/dL — ABNORMAL HIGH (ref 65–99)
Glucose-Capillary: 154 mg/dL — ABNORMAL HIGH (ref 65–99)
Glucose-Capillary: 184 mg/dL — ABNORMAL HIGH (ref 65–99)

## 2016-08-13 LAB — HEMOGLOBIN A1C
HEMOGLOBIN A1C: 7.4 % — AB (ref 4.8–5.6)
MEAN PLASMA GLUCOSE: 166 mg/dL

## 2016-08-13 LAB — CBC AND DIFFERENTIAL
HCT: 28 — AB (ref 41–53)
Hemoglobin: 9.1 — AB (ref 13.5–17.5)
PLATELETS: 144 — AB (ref 150–399)
WBC: 8.4

## 2016-08-13 MED ORDER — MORPHINE SULFATE (CONCENTRATE) 10 MG/0.5ML PO SOLN
5.0000 mg | ORAL | Status: DC | PRN
  Administered 2016-08-13: 5 mg via ORAL
  Filled 2016-08-13: qty 0.5

## 2016-08-13 MED ORDER — POTASSIUM CHLORIDE CRYS ER 20 MEQ PO TBCR
20.0000 meq | EXTENDED_RELEASE_TABLET | Freq: Once | ORAL | Status: AC
  Administered 2016-08-13: 20 meq via ORAL
  Filled 2016-08-13: qty 1

## 2016-08-13 MED ORDER — INSULIN ASPART 100 UNIT/ML ~~LOC~~ SOLN
3.0000 [IU] | Freq: Three times a day (TID) | SUBCUTANEOUS | Status: DC
  Administered 2016-08-14 – 2016-08-18 (×14): 3 [IU] via SUBCUTANEOUS

## 2016-08-13 MED ORDER — POLYETHYLENE GLYCOL 3350 17 G PO PACK: 17.0000 g | PACK | Freq: Every day | ORAL | Status: DC | PRN

## 2016-08-13 MED ORDER — LACTULOSE 10 GM/15ML PO SOLN
20.0000 g | Freq: Two times a day (BID) | ORAL | Status: DC
  Administered 2016-08-13 – 2016-08-18 (×10): 20 g via ORAL
  Filled 2016-08-13 (×11): qty 30

## 2016-08-13 MED ORDER — INSULIN ASPART 100 UNIT/ML ~~LOC~~ SOLN
0.0000 [IU] | Freq: Three times a day (TID) | SUBCUTANEOUS | Status: DC
  Administered 2016-08-13 – 2016-08-14 (×3): 2 [IU] via SUBCUTANEOUS
  Administered 2016-08-14: 1 [IU] via SUBCUTANEOUS
  Administered 2016-08-14: 3 [IU] via SUBCUTANEOUS
  Administered 2016-08-15: 2 [IU] via SUBCUTANEOUS
  Administered 2016-08-15: 1 [IU] via SUBCUTANEOUS
  Administered 2016-08-15 – 2016-08-16 (×3): 2 [IU] via SUBCUTANEOUS
  Administered 2016-08-17: 1 [IU] via SUBCUTANEOUS
  Administered 2016-08-17: 5 [IU] via SUBCUTANEOUS
  Administered 2016-08-17: 3 [IU] via SUBCUTANEOUS
  Administered 2016-08-18 (×2): 2 [IU] via SUBCUTANEOUS

## 2016-08-13 MED ORDER — ALLOPURINOL 300 MG PO TABS
150.0000 mg | ORAL_TABLET | Freq: Every day | ORAL | Status: DC
  Administered 2016-08-13 – 2016-08-18 (×6): 150 mg via ORAL
  Filled 2016-08-13 (×6): qty 1

## 2016-08-13 MED ORDER — INSULIN DETEMIR 100 UNIT/ML ~~LOC~~ SOLN
6.0000 [IU] | Freq: Every day | SUBCUTANEOUS | Status: DC
  Administered 2016-08-13 – 2016-08-17 (×5): 6 [IU] via SUBCUTANEOUS
  Filled 2016-08-13 (×5): qty 0.06

## 2016-08-13 MED ORDER — INSULIN DETEMIR 100 UNIT/ML ~~LOC~~ SOLN
6.0000 [IU] | Freq: Every day | SUBCUTANEOUS | Status: DC
  Administered 2016-08-13 – 2016-08-18 (×6): 6 [IU] via SUBCUTANEOUS
  Filled 2016-08-13 (×6): qty 0.06

## 2016-08-13 NOTE — Progress Notes (Signed)
Progress Note  Patient Name: Tony Mcclure Date of Encounter: 08/13/2016  Primary Cardiologist: Katrinka BlazingSmith  Subjective   Seems to be struggling to breathe, but says it is improved since yesterday.  Inpatient Medications    Scheduled Meds: . allopurinol  150 mg Oral Daily  . amLODipine  10 mg Oral Daily  . apixaban  2.5 mg Oral BID  . hydrALAZINE  25 mg Oral Q8H  . insulin aspart  0-9 Units Subcutaneous TID WC  . insulin aspart  3 Units Subcutaneous TID WC  . insulin detemir  6 Units Subcutaneous Daily   And  . insulin detemir  6 Units Subcutaneous QHS  . lactulose  20 g Oral BID  . metoprolol tartrate  25 mg Oral BID  . sodium chloride flush  3 mL Intravenous Q12H   Continuous Infusions: . sodium chloride    . albuterol    . ceFEPime (MAXIPIME) IV 1 g (08/12/16 1739)   PRN Meds: sodium chloride, acetaminophen, hydrALAZINE, ipratropium-albuterol, morphine CONCENTRATE, ondansetron (ZOFRAN) IV, polyethylene glycol, simethicone, sodium chloride flush   Vital Signs    Vitals:   08/13/16 0700 08/13/16 0730 08/13/16 1127 08/13/16 1216  BP: (!) 152/72 (!) 153/50 (!) 131/105   Pulse: 82 96 (!) 114   Resp: (!) 30 (!) 37 (!) 31   Temp:  97.7 F (36.5 C) 98.5 F (36.9 C)   TempSrc:  Oral Axillary   SpO2: 91% 90% (!) 80% 95%  Weight:      Height:        Intake/Output Summary (Last 24 hours) at 08/13/16 1430 Last data filed at 08/13/16 [id number]  Gross per 24 hour  Intake              253 ml  Output             1350 ml  Net            -1097 ml   Filed Weights   08/11/16 2300 08/12/16 0500 08/13/16 0500  Weight: 194 lb 3.6 oz (88.1 kg) 191 lb 12.8 oz (87 kg) 184 lb 4.9 oz (83.6 kg)    Telemetry    Atrial fibrillation with poor control and heart rates above 100 bpm. - Personally Reviewed  ECG    No new tracing available - Personally Reviewed  Physical Exam  Respiratory distress GEN:  skin is pink Neck:  neck veins appear to be flat Cardiac: RRR, no murmurs, rubs, or  gallops.  Respiratory: Clear to auscultation bilaterally. GI: Soft, nontender, non-distended  MS: No edema. Bilateral above-the-knee amputations. Neuro:  Nonfocal  Psych: Normal affect   Labs    Chemistry Recent Labs Lab 08/11/16 1727 08/11/16 1743 08/12/16 0346 08/13/16 0602  NA 138 138 139 138  K 4.0 4.0 3.9 3.4*  CL 106 108 106 103  CO2 17*  --  20* 20*  GLUCOSE 232* 238* 276* 151*  BUN 59* 55* 63* 70*  CREATININE 2.38* 2.40* 2.52* 2.60*  CALCIUM 9.4  --  9.1 9.1  PROT 6.5  --   --   --   ALBUMIN 3.7  --   --   --   AST 21  --   --   --   ALT 23  --   --   --   ALKPHOS 89  --   --   --   BILITOT 1.3*  --   --   --   GFRNONAA 23*  --  22* 21*  GFRAA 27*  --  25* 24*  ANIONGAP 15  --  13 15     Hematology Recent Labs Lab 08/11/16 1727 08/11/16 1743 08/12/16 0346 08/13/16 0602  WBC 11.7*  --  6.9 8.4  RBC 3.19*  --  2.98* 3.11*  HGB 9.7* 9.9* 8.7* 9.1*  HCT 29.5* 29.0* 27.4* 28.3*  MCV 92.5  --  91.9 91.0  MCH 30.4  --  29.2 29.3  MCHC 32.9  --  31.8 32.2  RDW 16.5*  --  16.4* 16.4*  PLT 139*  --  125* 144*    Cardiac Enzymes Recent Labs Lab 08/12/16 0346 08/12/16 0839  TROPONINI 0.71*  0.76* 0.76*    Recent Labs Lab 08/11/16 1743  TROPIPOC 0.15*     BNP Recent Labs Lab 08/11/16 1727  BNP 907.9*     DDimer No results for input(s): DDIMER in the last 168 hours.   Radiology    Dg Chest Port 1 View  Result Date: 08/11/2016 CLINICAL DATA:  Dyspnea,  tachypnea and expiratory wheeze. EXAM: PORTABLE CHEST 1 VIEW COMPARISON:  07/26/2016 FINDINGS: Status post CABG with median sternotomy sutures. Stable cardiomegaly. Atherosclerosis of the aortic arch without aneurysm. Diffuse interstitial edema with superimposed areas of bilateral airspace opacity raise concern for CHF. Superimposed pneumonia is not entirely excluded given smaller focus of confluent airspace opacity in the left mid lung. There may be a new loop reporting type device projecting  over the left heart border. IMPRESSION: 1. Cardiomegaly with post CABG change. 2. Aortic atherosclerosis. 3. Mild interstitial pulmonary edema. Superimposed pneumonia especially in the left mid lung is not entirely excluded though more likely related to CHF. Electronically Signed   By: Tollie Eth M.D.   On: 08/11/2016 18:01   Dg Abd Portable 1 View  Result Date: 08/11/2016 CLINICAL DATA:  Abdominal distention. Last bowel movement 4 days ago. EXAM: PORTABLE ABDOMEN - 1 VIEW COMPARISON:  07/26/2016 FINDINGS: The periphery of the right hemiabdomen is excluded on current exam due to body habitus. No significant retention of stool. Moderate gaseous distention of large bowel without definite source of mechanical obstruction. Superimposed gas containing small bowel loops are noted. No conclusive evidence for bowel obstruction. There is no free air, organomegaly nor suspicious calculi. There is lumbar spondylitic change with multilevel disc space narrowing and osteophyte formation as before. IMPRESSION: Nonobstructive gas pattern.  Lumbar spondylosis. Electronically Signed   By: Tollie Eth M.D.   On: 08/11/2016 17:58    Cardiac Studies   No new studies  Patient Profile     83 y.o. male with multiple significant medical problems including type 2 diabetes, stage IV chronic kidney disease, hypertension,, significant anemia requiring transfusion, paroxysmal atrial fibrillation with Chads Vasc> 5, chronic diastolic heart failure, bilateral above-the-knee amputee, thrombocytopenia, admitted with respiratory distress of multifactorial etiology.  Assessment & Plan    1. Hypokalemia, replete potassium 2. Atrial fibrillation, with poor rate control. 3. Stage IV chronic kidney disease, worsening with diuresis 4. Acute diastolic heart failure exacerbation has been treated with diuresis and the creatinine is trending upward. We may have reached the limit that IV diuresis will produce in terms of improving  respiratory status. In a.m. we need to consider switching back to oral therapy. 5. DO NOT RESUSCITATE   Signed, Lesleigh Noe, MD  08/13/2016, 2:30 PM

## 2016-08-13 NOTE — Progress Notes (Signed)
Patient has been tolerating Venturi mask, stated his breathing feels fine. No distress noted. SPO2 is 100%. RT will continue to monitor.

## 2016-08-13 NOTE — Progress Notes (Signed)
PT Cancellation Note  Patient Details Name: Tony DoomHershel Mcclure MRN: [id number] DOB: 1928-11-15   Cancelled Treatment:    Reason Eval/Treat Not Completed: Medical issues which prohibited therapy.  Per MD notes, elevated troponin thought to be demand ischemia and chronic in nature.  However, today, pt's RR in the mid to upper 30s and he is de- sating just speaking with me to 87% on 5-6L O2 [place].  He is expressing wanting to "leave this world".  RN notifying MD and MD coming in to speak with the pt.  PT to hold until tomorrow.  We did discuss that if he stabilizes, he does want to be able to go home and not return to the hospital.  I told him we may follow him from PT to help him stay physically able to move at home as long as it did not result in undue discomfort or pain.  PT will follow along with team.  Below is his prior level of function:    08/13/16 1042  Home Living  Family/patient expects to be discharged to: Private residence  Living Arrangements Children;Other relatives (daughter and grandson 81 y.o.)  Available Help at Discharge Family;Available PRN/intermittently (daughter and grandson both work days)  Type of Home House  Home Access Stairs to enter  Entergy CorporationEntrance Stairs-Number of Steps 2  Entrance Stairs-Rails Left  Home Layout One level  Bathroom Shower/Tub Tub/shower unit  Engineer, waterBathroom Toilet Standard  Home Equipment Walker - 2 wheels;Wheelchair - manual     08/13/16 1042  Prior Function  Level of Independence Needs assistance  Gait / Transfers Assistance Needed Pt reports he can usually get EOB , put bil prostheses on, stand with RW and get into his WC, he can get to toilet on his own, he preforms a sponge bath and dresses himself.  He reports that [facility] of the day [facility] because he is up all night.  He doesn't have any appetite.  Comments He continuously reports he just wants to die, MD made aware.       Rollene Rotundaebecca B. Thamas Appleyard, PT, DPT [phone]   08/13/2016, 10:45 AM

## 2016-08-13 NOTE — Progress Notes (Addendum)
PROGRESS NOTE    Tony Mcclure  GMW:[id number] DOB: [birth year] DOA: 08/11/2016 PCP: Bartholome Bill, MD  Outpatient Specialists:     Brief Narrative:  38 ? Recent newly diagnosed permanent atrial fibrillation-Chad score >4 on Elliquis  Complicated by sinus bradycardia during recent hospital stay 5/19-5/21 Prior CABG Type 2 diabetes mellitus Chronic kidney disease stage IV HTN Peripheral arterial/vascular disease with bilateral TKA-? Secondary to diabetic nephropathy Normochromic Anemia ? AOCD   Transfused 2 units PRBC recent admission  Documented mild thrombocytopenia-unclear etiology    Admitted 08/11/2016 with potential healthcare associated pneumonia the V S 80 CHF diastolic, BNP 440, CXR = interstitial edema, clinically JVD elevated  On admission required to stepdown status, BiPAP, was febrile heart rate 120s troponin elevated 0.15 CXR possible L M L PNA   Assessment & Plan:   Principal Problem:   Sepsis ([place]) Active Problems:   Normocytic anemia   Type 2 diabetes mellitus ([place])   Essential hypertension   S/P bilateral below knee amputation (HCC)   Paroxysmal A-fib (HCC)   HCAP (healthcare-associated pneumonia)   Thrombocytopenia (HCC)   Multifactorial grade C MMR C dyspnea Acute hypoxic respiratory failure requiring BiPAP initially on admission Component of AECHF diastolic Healthcare associated pneumonia  Continue IV antibiotics cefepime/vancomycin (MRSA screen is negative therefore discontinuing vancomycin 6/6), trend pro-calcitonin, trend lactic acid,white count 11--6 and improving  Cardiology has started Lasix IV 80 mg twice a day. Would continue the same with strict I/O, weight, salt restriction, monitoring of blood pressure is well. Given history of AE CHF will need to be careful with metoprolol dosing, defer to cardiology use of calcium channel blocker?  Patient currently on nasal cannula-but required bipapa yesterday--sats low 90's on 5  liters -holding bipap for now -start roxanol for WOB -he tells me he is "tired"  Has felt despondent and like "ending it all" since the past month -PHQ-2 +.  I will ask Chaplain and palliative care to look in on him and clarify intent of Rx and potentially counsel him He is from my perspective eligible if needed for Hopsice  Moderately controlled HTN Blood pressure 347-425 systolic Continue hydralazine 25 3 times a day, Lopressor 25 twice a day, amlodipine 10 daily, ACE is relatively contraindicated given CKD 4 Home meds of minoxidil 2.5 twice a day on hold  History of CABG Troponins 0.76 with flat trend Cardiology consulted and did not feel this is ischemic related rather type II demand ischemia related to troponin leak/CK D IV  [place] kidney disease stage IV Metabolic/renal acidosis Monitor trend of creatinine Might need at some point discussion with nephrology although patient is DO NOT RESUSCITATE and will need to discuss this  Persistent atrial fibrillation [place] score 4 Continue metoprolol as above Continue Elliquis 2.5 cautiously given recent AOCD and transfusion  recent Hemoccult stool negative  Diabetes mellitus type 2-complications of neuropathy, wound and bilateral BKA Hold Cogentin 5 daily, continue Levemir 12-14 units twice a day Continue sliding scale coverage as well as 8 units regular insulin twice a day meals  ? Gout Patient on allopurinol 300 at renal dosing  Hyperlipidemia continue Zocor 40 daily  Constipation continue MiraLAX 17 daily Added and changed to Lactulose 6/6 as above not helping       DVT prophylaxis: eliquis Code Status: DNR--? Hopsice? Family Communication: communicated with daughter 08/13/16 Disposition Plan: unclear as yet--see above   Consultants:   Cardiology  Procedures:     Antimicrobials:   Vancomycin 6/4-->6/6  Cefepime 6/4  Subjective:  Increased WOB No cp Feels bloated Not eating  Objective: Vitals:    08/13/16 0400 08/13/16 0500 08/13/16 0600 08/13/16 0629  BP: (!) 158/70 (!) 142/62 139/67 135/65  Pulse: 98 97 97   Resp: (!) 25 (!) 29 (!) 24   Temp:      TempSrc:      SpO2: 98% 93% 91%   Weight:  83.6 kg (184 lb 4.9 oz)    Height:        Intake/Output Summary (Last 24 hours) at 08/13/16 0730 Last data filed at 08/13/16 1505  Gross per 24 hour  Intake              256 ml  Output             1850 ml  Net            -1594 ml   Filed Weights   08/11/16 2300 08/12/16 0500 08/13/16 0500  Weight: 88.1 kg (194 lb 3.6 oz) 87 kg (191 lb 12.8 oz) 83.6 kg (184 lb 4.9 oz)    Examination:  Visibly sob, some jvd, no rales no rhonchi s1 s2 irreg irreg abd soft diet Lower extremity no swollen Neuro intact    Data Reviewed: I have personally reviewed following labs and imaging studies  CBC:  Recent Labs Lab 08/11/16 1727 08/11/16 1743 08/12/16 0346  WBC 11.7*  --  6.9  NEUTROABS 10.0*  --  5.3  HGB 9.7* 9.9* 8.7*  HCT 29.5* 29.0* 27.4*  MCV 92.5  --  91.9  PLT 139*  --  697*   Basic Metabolic Panel:  Recent Labs Lab 08/11/16 1727 08/11/16 1743 08/12/16 0346  NA 138 138 139  K 4.0 4.0 3.9  CL 106 108 106  CO2 17*  --  20*  GLUCOSE 232* 238* 276*  BUN 59* 55* 63*  CREATININE 2.38* 2.40* 2.52*  CALCIUM 9.4  --  9.1   GFR: Estimated Creatinine Clearance: 21.3 mL/min (A) (by C-G formula based on SCr of 2.52 mg/dL (H)). Liver Function Tests:  Recent Labs Lab 08/11/16 1727  AST 21  ALT 23  ALKPHOS 89  BILITOT 1.3*  PROT 6.5  ALBUMIN 3.7    Recent Labs Lab 08/11/16 1727  LIPASE 17   No results for input(s): AMMONIA in the last 168 hours. Coagulation Profile: No results for input(s): INR, PROTIME in the last 168 hours. Cardiac Enzymes:  Recent Labs Lab 08/12/16 0346 08/12/16 0839  TROPONINI 0.71*  0.76* 0.76*   BNP (last 3 results) No results for input(s): PROBNP in the last 8760 hours. HbA1C:  Recent Labs  08/12/16 1030  HGBA1C 7.4*    CBG:  Recent Labs Lab 08/12/16 0739 08/12/16 1145 08/12/16 1635 08/12/16 1736 08/12/16 2114  GLUCAP 205* 124* 68 97 166*   Lipid Profile: No results for input(s): CHOL, HDL, LDLCALC, TRIG, CHOLHDL, LDLDIRECT in the last 72 hours. Thyroid Function Tests: No results for input(s): TSH, T4TOTAL, FREET4, T3FREE, THYROIDAB in the last 72 hours. Anemia Panel: No results for input(s): VITAMINB12, FOLATE, FERRITIN, TIBC, IRON, RETICCTPCT in the last 72 hours. Urine analysis:    Component Value Date/Time   COLORURINE YELLOW 08/12/2016 0617   APPEARANCEUR CLOUDY (A) 08/12/2016 0617   LABSPEC 1.011 08/12/2016 0617   PHURINE 6.0 08/12/2016 0617   GLUCOSEU NEGATIVE 08/12/2016 0617   HGBUR SMALL (A) 08/12/2016 0617   BILIRUBINUR NEGATIVE 08/12/2016 0617   KETONESUR NEGATIVE 08/12/2016 0617   PROTEINUR 100 (A) 08/12/2016 0617  NITRITE NEGATIVE 08/12/2016 0617   LEUKOCYTESUR NEGATIVE 08/12/2016 0617   Sepsis Labs: _0 (procalcitonin:4,lacticidven:4)  ) Recent Results (from the past 240 hour(s))  MRSA PCR Screening     Status: None   Collection Time: 08/11/16 10:55 PM  Result Value Ref Range Status   MRSA by PCR NEGATIVE NEGATIVE Final    Comment:        The GeneXpert MRSA Assay (FDA approved for NASAL specimens only), is one component of a comprehensive MRSA colonization surveillance program. It is not intended to diagnose MRSA infection nor to guide or monitor treatment for MRSA infections.          Radiology Studies: Dg Chest Port 1 View  Result Date: 08/11/2016 CLINICAL DATA:  Dyspnea,  tachypnea and expiratory wheeze. EXAM: PORTABLE CHEST 1 VIEW COMPARISON:  07/26/2016 FINDINGS: Status post CABG with median sternotomy sutures. Stable cardiomegaly. Atherosclerosis of the aortic arch without aneurysm. Diffuse interstitial edema with superimposed areas of bilateral airspace opacity raise concern for CHF. Superimposed pneumonia is not entirely excluded given  smaller focus of confluent airspace opacity in the left mid lung. There may be a new loop reporting type device projecting over the left heart border. IMPRESSION: 1. Cardiomegaly with post CABG change. 2. Aortic atherosclerosis. 3. Mild interstitial pulmonary edema. Superimposed pneumonia especially in the left mid lung is not entirely excluded though more likely related to CHF. Electronically Signed   By: Ashley Royalty M.D.   On: 08/11/2016 18:01   Dg Abd Portable 1 View  Result Date: 08/11/2016 CLINICAL DATA:  Abdominal distention. Last bowel movement 4 days ago. EXAM: PORTABLE ABDOMEN - 1 VIEW COMPARISON:  07/26/2016 FINDINGS: The periphery of the right hemiabdomen is excluded on current exam due to body habitus. No significant retention of stool. Moderate gaseous distention of large bowel without definite source of mechanical obstruction. Superimposed gas containing small bowel loops are noted. No conclusive evidence for bowel obstruction. There is no free air, organomegaly nor suspicious calculi. There is lumbar spondylitic change with multilevel disc space narrowing and osteophyte formation as before. IMPRESSION: Nonobstructive gas pattern.  Lumbar spondylosis. Electronically Signed   By: Ashley Royalty M.D.   On: 08/11/2016 17:58        Scheduled Meds: . amLODipine  10 mg Oral Daily  . apixaban  2.5 mg Oral BID  . hydrALAZINE  25 mg Oral Q8H  . insulin aspart  8 Units Subcutaneous BID WC  . insulin detemir  6 Units Subcutaneous QHS   And  . insulin detemir  14 Units Subcutaneous Daily  . metoprolol tartrate  25 mg Oral BID  . sodium chloride flush  3 mL Intravenous Q12H   Continuous Infusions: . sodium chloride    . albuterol    . ceFEPime (MAXIPIME) IV 1 g (08/12/16 1739)  . vancomycin Stopped (08/12/16 2307)     LOS: 2 days    Time spent: [place], MD [facility]   If 7PM-7AM, please contact night-coverage [email or web address] Password  [email or web address] 08/13/2016, 7:30 AM

## 2016-08-13 NOTE — Consult Note (Signed)
Consultation Note Date: 08/13/2016   Patient Name: Tony Mcclure  DOB: 08-Dec-1928  MRN: [id number]  Age / Sex: 83 y.o., male  PCP: [name], MD Referring Physician: Rhetta Mura, MD  Reason for Consultation: Establishing goals of care and Psychosocial/spiritual support  HPI/Patient Profile: 83 y.o. male  admitted on 08/11/2016 with past medical  history significant for hypertension, CAD S/P CABG, type 2 diabetes, CKD stage IV, atrial fibrillation on anticoagulation with Eliquis, and bilateral BKA   Patient was just recently hospitalized from 5/19-5/21 for new-onset A. fib and symptomatic anemia which the patient received 2 units of PRBCs and was started on Eliquis. After patient was discharged on home with his daughter but they noted that he never fully recovered.  Per family he has had slow physical and functional decline over the past several months  Daughter also notes that he was diagnosed with bronchitis and had been treated with doxycycline until the 5/25.  On day of admission had increased  shortness of breath, productive cough  EMS called and transported.  Per EMR:   ED Course: Upon admission to the emergency room patient was placed on BiPAP for significant respiratory distress. Initial ABG revealed a pH of 7.201, PCO2 46.8,and PO2 170. Patient's vital signs included temperature 101.4, pulse up to 127, respirations of 241, blood pressure 170/53, and O2 saturations maintained on BiPAP. Labs revealed WBC 11.7, hemoglobin 9.7, platelets 139, CO2 17, BUN 59, creatinine 2.38, glucose 232, BNP 907.9, troponin 0.15. Chest x-ray showed pulmonary edema with possible left mid lobe pneumonia. Sepsis protocol was initiated and patient was started on empiric antibiotics of vancomycin and cefepime along with given DuoNeb.   Admitted for stabilization and treatment.  He is improved since admission  but remains on Bi Pap intermittently    Patient and his family face, treatment option decisions,   advance directive decisions and  anticipatory care needs.    Clinical Assessment and Goals of Care:   This NP Lorinda Creed reviewed medical records, received report from team, assessed the patient and then meet at the patient's bedside along  to discuss diagnosis, prognosis, GOC, EOL wishes disposition and options.  I then discussed the above with his daughter/ Tony Mcclure by telephone.   We discussed his multiple co-morbidites and his advanced age/mortality as it relates to long term poor prognosis.   Natural trajectory and expectations at EOL were discussed.   A  discussion was had today regarding advanced directives.  Concepts specific to code status, artifical feeding and hydration, continued IV antibiotics and rehospitalization was had.  The difference between a aggressive medical intervention path  and a palliative comfort care path for this patient at this time was had.  Values and goals of care important to patient and family were attempted to be elicited.  Concept of Palliative Care were discussed   Questions and concerns addressed.   Family encouraged to call with questions or concerns.  PMT will continue to support holistically.     SUMMARY OF RECOMMENDATIONS  Code Status/Advance Care Planning:  DNR   Palliative Prophylaxis:   Aspiration, Bowel Regimen, Delirium Protocol and Oral Care  Additional Recommendations (Limitations, Scope, Preferences):  Full Scope Treatment  Psycho-social/Spiritual:   Desire for further Chaplaincy support:yes  Patient has recently moved to his area to be closer to his daughter here in [place] after the death of [place] other [place]        He lives at home with his daughter and Tony Bossiergrand-son but see very little of them in that they both work long hour jobs.  Daughter expresses concern about this feeling that  he is lonely, but "unmotivated" to get involved in anything himself.     Patient expresses his hope for improvement but also  readiness to die, but daughter verbalizes that "I am not giving up on him ".     Prognosis:    Unable to determine, dependant on desire for life prolonging measures  Discharge Planning: To Be Determined      Primary Diagnoses: Present on Admission: . Sepsis (HCC) . HCAP (healthcare-associated pneumonia) . Paroxysmal A-fib (HCC) . Essential hypertension . Normocytic anemia . Thrombocytopenia (HCC)   I have reviewed the medical record, interviewed the patient and family, and examined the patient. The following aspects are pertinent.  Past Medical History:  Diagnosis Date  . BPH (benign prostatic hyperplasia)   . CAD (coronary artery disease)   . Diabetes mellitus without complication (HCC)   . Hypertension   . Kidney disease   . S/P bilateral BKA (below knee amputation) [facility](HCC)    Social History   Social History  . Marital status: Single    Spouse name: N/A  . Number of children: N/A  . Years of education: N/A   Social History Main Topics  . Smoking status: Former Smoker    Years: 20.00  . Smokeless tobacco: Never Used  . Alcohol use No  . Drug use: No  . Sexual activity: Not Asked   Other Topics Concern  . None   Social History Narrative  . None   Family History  Problem Relation Age of Onset  . Heart attack Father    Scheduled Meds: . allopurinol  150 mg Oral Daily  . amLODipine  10 mg Oral Daily  . apixaban  2.5 mg Oral BID  . hydrALAZINE  25 mg Oral Q8H  . insulin aspart  0-9 Units Subcutaneous TID WC  . insulin aspart  3 Units Subcutaneous TID WC  . insulin detemir  6 Units Subcutaneous Daily   And  . insulin detemir  6 Units Subcutaneous QHS  . lactulose  20 g Oral BID  . metoprolol tartrate  25 mg Oral BID  . sodium chloride flush  3 mL Intravenous Q12H   Continuous Infusions: . sodium chloride    . albuterol      . ceFEPime (MAXIPIME) IV 1 g (08/12/16 1739)   PRN Meds:.sodium chloride, acetaminophen, hydrALAZINE, ipratropium-albuterol, morphine CONCENTRATE, ondansetron (ZOFRAN) IV, polyethylene glycol, simethicone, sodium chloride flush Medications Prior to Admission:  Prior to Admission medications   Medication Sig Start Date End Date Taking? Authorizing Provider  allopurinol (ZYLOPRIM) 300 MG tablet Take 300 mg by mouth daily.   Yes [provider]  amLODipine (NORVASC) 10 MG tablet Take 10 mg by mouth daily.   Yes [provider]  apixaban (ELIQUIS) 2.5 MG TABS tablet Take 1 tablet (2.5 mg total) by mouth 2 (two) times daily. 07/28/16  Yes Rizwan,  Ladell Heads, MD  hydrALAZINE (APRESOLINE) 25 MG tablet Take 1 tablet (25 mg total) by mouth every 8 (eight) hours. 07/28/16  Yes Calvert Cantor, MD  insulin aspart (NOVOLOG) 100 UNIT/ML injection Inject 8 Units into the skin 2 (two) times daily with a meal. Take with breakfast and dinner   Yes [provider]  insulin detemir (LEVEMIR) 100 UNIT/ML injection Inject 12-14 Units into the skin 2 (two) times daily. Inject 14 units in the morning and Inject 12 units in the evening.   Yes [provider]  linagliptin (TRADJENTA) 5 MG TABS tablet Take 5 mg by mouth daily.   Yes [provider]  metoprolol tartrate (LOPRESSOR) 25 MG tablet Take 0.5 tablets (12.5 mg total) by mouth 2 (two) times daily. 07/28/16  Yes Calvert Cantor, MD  minoxidil (LONITEN) 2.5 MG tablet Take 2.5 mg by mouth 2 (two) times daily.   Yes [provider]  simvastatin (ZOCOR) 40 MG tablet Take 40 mg by mouth every evening.   Yes [provider]  doxycycline (DORYX) 100 MG EC tablet Take 100 mg by mouth 2 (two) times daily. For 7 Days. ABT Start Date 07/25/16 & End Date 08/01/16.    [provider]  polyethylene glycol (MIRALAX / GLYCOLAX) packet Take 17 g by mouth daily as needed for moderate constipation.    [provider]    No Known Allergies Review of Systems  Respiratory: Positive for shortness of breath.   Neurological: Positive for weakness.    Physical Exam  Constitutional: He appears ill.  - Bi Pap  HENT:  Mouth/Throat: Oropharynx is clear and moist.  Cardiovascular: Tachycardia present.   Pulmonary/Chest: He has decreased breath sounds in the right lower field and the left lower field.  Skin: Skin is warm and dry.    Vital Signs: BP (!) 131/105   Pulse (!) 114   Temp 98.5 F (36.9 C) (Axillary)   Resp (!) 31   Ht 5\' 6"  (1.676 m)   Wt 83.6 kg (184 lb 4.9 oz)   SpO2 95%   BMI 29.75 kg/m  Pain Assessment: No/denies pain   Pain Score: 0-No pain   SpO2: SpO2: 95 % O2 Device:SpO2: 95 % O2 Flow Rate: .O2 Flow Rate (L/min): 2 L/min  IO: Intake/output summary:  Intake/Output Summary (Last 24 hours) at 08/13/16 1332 Last data filed at 08/13/16 1610  Gross per 24 hour  Intake              253 ml  Output             1350 ml  Net            -1097 ml    LBM: Last BM Date: 08/12/16 (PTA- only passing mucus) Baseline Weight: Weight: 90.3 kg (199 lb) Most recent weight: Weight: 83.6 kg (184 lb 4.9 oz)      Palliative Assessment/Data: 30 % at best   Discussed with Dr Mahala Menghini  This NP will meet with his daughter tomorrow at 11:30 in patient's room for continued discussion.  Time In: 1230 Time Out: 1345 Time Total: 75 min Greater than 50%  of this time was spent counseling and coordinating care related to the above assessment and plan.  Signed by: Lorinda Creed, NP   Please contact Palliative Medicine Team phone at [phone] for questions and concerns.  For individual provider: See Loretha Stapler

## 2016-08-14 ENCOUNTER — Inpatient Hospital Stay (HOSPITAL_COMMUNITY): Payer: Medicare Other

## 2016-08-14 DIAGNOSIS — Z66 Do not resuscitate: Secondary | ICD-10-CM | POA: Diagnosis present

## 2016-08-14 DIAGNOSIS — K5900 Constipation, unspecified: Secondary | ICD-10-CM | POA: Diagnosis present

## 2016-08-14 DIAGNOSIS — R0600 Dyspnea, unspecified: Secondary | ICD-10-CM

## 2016-08-14 DIAGNOSIS — R627 Adult failure to thrive: Secondary | ICD-10-CM | POA: Diagnosis present

## 2016-08-14 DIAGNOSIS — Z515 Encounter for palliative care: Secondary | ICD-10-CM

## 2016-08-14 LAB — COMPREHENSIVE METABOLIC PANEL
ALBUMIN: 3.4 g/dL — AB (ref 3.5–5.0)
ALK PHOS: 78 U/L (ref 38–126)
ALT: 31 U/L (ref 17–63)
ANION GAP: 14 (ref 5–15)
AST: 28 U/L (ref 15–41)
BUN: 83 mg/dL — ABNORMAL HIGH (ref 6–20)
CALCIUM: 9.3 mg/dL (ref 8.9–10.3)
CO2: 20 mmol/L — ABNORMAL LOW (ref 22–32)
Chloride: 106 mmol/L (ref 101–111)
Creatinine, Ser: 2.72 mg/dL — ABNORMAL HIGH (ref 0.61–1.24)
GFR calc non Af Amer: 20 mL/min — ABNORMAL LOW (ref >60)
GFR, EST AFRICAN AMERICAN: 23 mL/min — AB (ref >60)
GLUCOSE: 152 mg/dL — AB (ref 65–99)
POTASSIUM: 4.4 mmol/L (ref 3.5–5.1)
SODIUM: 140 mmol/L (ref 135–145)
Total Bilirubin: 1.2 mg/dL (ref 0.3–1.2)
Total Protein: 6.6 g/dL (ref 6.5–8.1)

## 2016-08-14 LAB — CBC AND DIFFERENTIAL
HEMATOCRIT: 29 — AB (ref 41–53)
Hemoglobin: 9.2 — AB (ref 13.5–17.5)
WBC: 6.9

## 2016-08-14 LAB — HEPATIC FUNCTION PANEL
ALT: 31 (ref 10–40)
AST: 28 (ref 14–40)
Alkaline Phosphatase: 78 (ref 25–125)
Bilirubin, Total: 1.2

## 2016-08-14 LAB — BASIC METABOLIC PANEL
Anion gap: 10 (ref 5–15)
BUN: 81 mg/dL — ABNORMAL HIGH (ref 6–20)
BUN: 81 — AB (ref 4–21)
CHLORIDE: 106 mmol/L (ref 101–111)
CO2: 22 mmol/L (ref 22–32)
Calcium: 9.2 mg/dL (ref 8.9–10.3)
Creatinine, Ser: 2.48 mg/dL — ABNORMAL HIGH (ref 0.61–1.24)
Creatinine: 2.5 — AB (ref 0.6–1.3)
GFR calc Af Amer: 25 mL/min — ABNORMAL LOW (ref >60)
GFR calc non Af Amer: 22 mL/min — ABNORMAL LOW (ref >60)
GLUCOSE: 109 mg/dL — AB (ref 65–99)
Glucose: 109
POTASSIUM: 3.8 (ref 3.4–5.3)
POTASSIUM: 3.8 mmol/L (ref 3.5–5.1)
Sodium: 138 (ref 137–147)
Sodium: 138 mmol/L (ref 135–145)

## 2016-08-14 LAB — CBC WITH DIFFERENTIAL/PLATELET
BASOS ABS: 0 10*3/uL (ref 0.0–0.1)
BASOS PCT: 0 %
EOS ABS: 0.1 10*3/uL (ref 0.0–0.7)
Eosinophils Relative: 1 %
HCT: 28.5 % — ABNORMAL LOW (ref 39.0–52.0)
HEMOGLOBIN: 9.2 g/dL — AB (ref 13.0–17.0)
LYMPHS PCT: 16 %
Lymphs Abs: 1.1 10*3/uL (ref 0.7–4.0)
MCH: 29.6 pg (ref 26.0–34.0)
MCHC: 32.3 g/dL (ref 30.0–36.0)
MCV: 91.6 fL (ref 78.0–100.0)
MONOS PCT: 7 %
Monocytes Absolute: 0.5 10*3/uL (ref 0.1–1.0)
NEUTROS PCT: 76 %
Neutro Abs: 5.2 10*3/uL (ref 1.7–7.7)
PLATELETS: UNDETERMINED 10*3/uL (ref 150–400)
RBC: 3.11 MIL/uL — ABNORMAL LOW (ref 4.22–5.81)
RDW: 16.4 % — ABNORMAL HIGH (ref 11.5–15.5)
WBC: 6.9 10*3/uL (ref 4.0–10.5)

## 2016-08-14 LAB — GLUCOSE, CAPILLARY
GLUCOSE-CAPILLARY: 147 mg/dL — AB (ref 65–99)
Glucose-Capillary: 229 mg/dL — ABNORMAL HIGH (ref 65–99)
Glucose-Capillary: 168 mg/dL — ABNORMAL HIGH (ref 65–99)
Glucose-Capillary: 123 mg/dL — ABNORMAL HIGH (ref 65–99)

## 2016-08-14 LAB — MAGNESIUM: Magnesium: 2.4 mg/dL (ref 1.7–2.4)

## 2016-08-14 MED ORDER — HYDRALAZINE HCL 50 MG PO TABS
50.0000 mg | ORAL_TABLET | Freq: Three times a day (TID) | ORAL | Status: DC
  Administered 2016-08-14 – 2016-08-18 (×11): 50 mg via ORAL
  Filled 2016-08-14 (×11): qty 1

## 2016-08-14 MED ORDER — SENNOSIDES-DOCUSATE SODIUM 8.6-50 MG PO TABS
2.0000 | ORAL_TABLET | Freq: Every evening | ORAL | Status: DC | PRN
  Administered 2016-08-14 (×2): 2 via ORAL
  Filled 2016-08-14 (×2): qty 2

## 2016-08-14 NOTE — Progress Notes (Addendum)
PROGRESS NOTE    Tony Mcclure  MGQ:[id number] DOB: 1928-04-29 DOA: 08/11/2016 PCP: Bartholome Bill, MD  Outpatient Specialists:     Brief Narrative:  66 ? Recent newly diagnosed permanent atrial fibrillation-Chad score >4 on Elliquis  Complicated by sinus bradycardia during recent hospital stay 5/19-5/21 Prior CABG Type 2 diabetes mellitus Chronic kidney disease stage IV HTN Peripheral arterial/vascular disease with bilateral TKA-? Secondary to diabetic nephropathy Normochromic Anemia ? AOCD   Transfused 2 units PRBC recent admission  Documented mild thrombocytopenia-unclear etiology    Admitted 08/11/2016 with potential healthcare associated pneumonia the V S 80 CHF diastolic, BNP 267, CXR = interstitial edema, clinically JVD elevated  On admission required to stepdown status, BiPAP, was febrile heart rate 120s troponin elevated 0.15 CXR possible L M L PNA   Assessment & Plan:   Principal Problem:   Sepsis ([place]) Active Problems:   Normocytic anemia   Type 2 diabetes mellitus (HCC)   Essential hypertension   S/P bilateral below knee amputation (HCC)   Paroxysmal A-fib (HCC)   HCAP (healthcare-associated pneumonia)   Thrombocytopenia (HCC)   Acute diastolic heart failure (HCC)   CKD (chronic kidney disease), stage IV (HCC)   Multifactorial grade C MMR C dyspnea Acute hypoxic respiratory failure requiring BiPAP initially on admission Component of AECHF diastolic Healthcare associated pneumonia  Continue IV antibiotics cefepime/vancomycin (MRSA screen is negative therefore discontinuing vancomycin 6/6), trend pro-calcitonin, trend lactic acid,white count 11--6 --improved Lasix 40 iv bid held 6/7  -minus 2.2 liters, minus 8 pounds since admission Okay to probably discontinue Foley if patient able to void  required bipap up till 6/5--still needed Venturi until this am and now on 6 L Keep on SDU today continue roxanol 5 mg q4 prn for WOB  Situational  depression Long discussion 6/6 -Has felt despondent since daughter passed away and family left on vacation and felt like "ending it all" since the past month -PHQ-2 +. Full scope care but DNR Will continue to delineate goals Thank you for assistance pallaitive care  Moderately controlled HTN Blood pressure 124-580 systolic Continue Lopressor 25 twice a day, amlodipine 10 daily, ACE is relatively contraindicated given CKD 4 Home meds of minoxidil 2.5 twice a day on hold  increase hydralazine to 50 3 times a day if when necessary  History of CABG Troponins 0.76 with flat trend Cardiology consulted and did not feel this is ischemic related rather type II demand ischemia related to troponin leak/CK D IV Have singed off  Chronic kidney disease stage IV Metabolic/renal acidosis GFR stabilized at 23 and 25 Continue diuresis as above Might need at some point discussion with nephrology although patient is DO NOT RESUSCITATE and will need to discuss this  Persistent atrial fibrillation [place] score 4 Probable sick sinus syndrome given bradycardia, age Continue metoprolol as above Continue Elliquis 2.5 cautiously given recent AOCD and transfusion  recent Hemoccult stool negative  Diabetes mellitus type 2-complications of neuropathy, wound and bilateral BKA Hold Tradjenta 5 daily, cut back 2/2 to hypoglycemia 12-14-->6 U bid Levemir  Continue sliding scale coverage as well as 3 units regular insulin twice a day meals  ? Gout Patient on allopurinol 300 at renal dosing  Hyperlipidemia continue Zocor 40 daily  Abdominal distension Constipation continue Lactulose 30 PMT added Senna Get KUB r/o other etiology in abd    DVT prophylaxis: eliquis Code Status: DNR--? Hopsice? Family Communication: communicated with daughter 08/13/16--no family + today on my rounds Disposition Plan: unclear as yet--see above--Needs SDU  given high risk respiratory decompnesation   Consultants:    Cardiology  Palliative care  Chaplain  Procedures:     Antimicrobials:   Vancomycin 6/4-->6/6  Cefepime 6/4   Subjective:  Well Sleepy no new issue Eating and drinking fair No cp No sputum   Objective: Vitals:   08/14/16 0400 08/14/16 0432 08/14/16 0500 08/14/16 0559  BP: (!) 145/79 134/88 131/72 (!) 153/108  Pulse:  92 (!) 136   Resp: (!) 27 (!) 30 (!) 25   Temp:  97.6 F (36.4 C)    TempSrc:  Oral    SpO2:  99% 95%   Weight:  83.2 kg (183 lb 6.8 oz)    Height:        Intake/Output Summary (Last 24 hours) at 08/14/16 0738 Last data filed at 08/14/16 0433  Gross per 24 hour  Intake              240 ml  Output              850 ml  Net             -610 ml   Filed Weights   08/12/16 0500 08/13/16 0500 08/14/16 0432  Weight: 87 kg (191 lb 12.8 oz) 83.6 kg (184 lb 4.9 oz) 83.2 kg (183 lb 6.8 oz)    Examination:  eomi ncat no pallor no ict poor dentition No jvd Chest anterioarly clear abd distedned++ AKA bnilaterally is benign and skin is not swollne   Data Reviewed: I have personally reviewed following labs and imaging studies  CBC:  Recent Labs Lab 08/11/16 1727 08/11/16 1743 08/12/16 0346 08/13/16 0602 08/14/16 0503  WBC 11.7*  --  6.9 8.4 6.9  NEUTROABS 10.0*  --  5.3  --  5.2  HGB 9.7* 9.9* 8.7* 9.1* 9.2*  HCT 29.5* 29.0* 27.4* 28.3* 28.5*  MCV 92.5  --  91.9 91.0 91.6  PLT 139*  --  125* 144* PLATELET CLUMPS NOTED ON SMEAR, UNABLE TO ESTIMATE   Basic Metabolic Panel:  Recent Labs Lab 08/11/16 1727 08/11/16 1743 08/12/16 0346 08/13/16 0602 08/14/16 0503  NA 138 138 139 138 140  K 4.0 4.0 3.9 3.4* 4.4  CL 106 108 106 103 106  CO2 17*  --  20* 20* 20*  GLUCOSE 232* 238* 276* 151* 152*  BUN 59* 55* 63* 70* 83*  CREATININE 2.38* 2.40* 2.52* 2.60* 2.72*  CALCIUM 9.4  --  9.1 9.1 9.3   GFR: Estimated Creatinine Clearance: 19.7 mL/min (A) (by C-G formula based on SCr of 2.72 mg/dL (H)). Liver Function Tests:  Recent  Labs Lab 08/11/16 1727 08/14/16 0503  AST 21 28  ALT 23 31  ALKPHOS 89 78  BILITOT 1.3* 1.2  PROT 6.5 6.6  ALBUMIN 3.7 3.4*    Recent Labs Lab 08/11/16 1727  LIPASE 17   No results for input(s): AMMONIA in the last 168 hours. Coagulation Profile: No results for input(s): INR, PROTIME in the last 168 hours. Cardiac Enzymes:  Recent Labs Lab 08/12/16 0346 08/12/16 0839  TROPONINI 0.71*  0.76* 0.76*   BNP (last 3 results) No results for input(s): PROBNP in the last 8760 hours. HbA1C:  Recent Labs  08/12/16 1030  HGBA1C 7.4*   CBG:  Recent Labs Lab 08/13/16 0827 08/13/16 1051 08/13/16 1302 08/13/16 1558 08/13/16 2124  GLUCAP 150* 154* 172* 184* 163*   Lipid Profile: No results for input(s): CHOL, HDL, LDLCALC, TRIG, CHOLHDL, LDLDIRECT in the last 72 hours.  Thyroid Function Tests: No results for input(s): TSH, T4TOTAL, FREET4, T3FREE, THYROIDAB in the last 72 hours. Anemia Panel: No results for input(s): VITAMINB12, FOLATE, FERRITIN, TIBC, IRON, RETICCTPCT in the last 72 hours. Urine analysis:    Component Value Date/Time   COLORURINE YELLOW 08/12/2016 0617   APPEARANCEUR CLOUDY (A) 08/12/2016 0617   LABSPEC 1.011 08/12/2016 0617   PHURINE 6.0 08/12/2016 0617   GLUCOSEU NEGATIVE 08/12/2016 0617   HGBUR SMALL (A) 08/12/2016 0617   BILIRUBINUR NEGATIVE 08/12/2016 0617   KETONESUR NEGATIVE 08/12/2016 0617   PROTEINUR 100 (A) 08/12/2016 0617   NITRITE NEGATIVE 08/12/2016 0617   LEUKOCYTESUR NEGATIVE 08/12/2016 0617   Sepsis Labs: _0 (procalcitonin:4,lacticidven:4)  ) Recent Results (from the past 240 hour(s))  MRSA PCR Screening     Status: None   Collection Time: 08/11/16 10:55 PM  Result Value Ref Range Status   MRSA by PCR NEGATIVE NEGATIVE Final    Comment:        The GeneXpert MRSA Assay (FDA approved for NASAL specimens only), is one component of a comprehensive MRSA colonization surveillance program. It is not intended to  diagnose MRSA infection nor to guide or monitor treatment for MRSA infections.          Radiology Studies: No results found.      Scheduled Meds: . allopurinol  150 mg Oral Daily  . amLODipine  10 mg Oral Daily  . apixaban  2.5 mg Oral BID  . hydrALAZINE  25 mg Oral Q8H  . insulin aspart  0-9 Units Subcutaneous TID WC  . insulin aspart  3 Units Subcutaneous TID WC  . insulin detemir  6 Units Subcutaneous Daily   And  . insulin detemir  6 Units Subcutaneous QHS  . lactulose  20 g Oral BID  . metoprolol tartrate  25 mg Oral BID  . sodium chloride flush  3 mL Intravenous Q12H   Continuous Infusions: . sodium chloride    . albuterol    . ceFEPime (MAXIPIME) IV 1 g (08/13/16 1709)     LOS: 3 days    Time spent: [place], MD [facility]   If 7PM-7AM, please contact night-coverage [email or web address] Password [facility] 08/14/2016, 7:38 AM

## 2016-08-14 NOTE — Evaluation (Signed)
Physical Therapy Evaluation Patient Details Name: Tony Mcclure MRN: [id number] DOB: [birth year] Today's Date: 08/14/2016   History of Present Illness  83 y.o. male admitted to [facility] on 08/11/16 for SOB.  Dx with acute hypoxic respiratory failure requiring BiPAP initially, acute on chronic CHF (diastolic), and HCAP, elevated troponin thought to be demand ischemia, and metabolic acidosis.  Pt with other significant PMhx of bil BKA, HTN, DM, CAD, and CABG.   Clinical Impression  Pt was able to scoot OOB to drop arm recliner chair with assist and rest breaks today.  He did not want to try to stand with his prosthesis on today, but was willing to consider tomorrow.  DOE and RR are both better today and both remained stable during mobility OOB to chair.  I will need to see him closer to his baseline to feel safe recommending home therapy, so for now, SNF for rehab will be his safest, most supervised option.   PT to follow acutely for deficits listed below.     Follow Up Recommendations SNF (pending progress)    Equipment Recommendations  None recommended by PT    Recommendations for Other Services   NA    Precautions / Restrictions Precautions Precautions: Fall Precaution Comments: bil BKA Required Braces or Orthoses: Other Brace/Splint Other Brace/Splint: bil prosthesis here in room.       Mobility  Bed Mobility Overal bed mobility: Needs Assistance Bed Mobility: Supine to Sit (supine to long sitting)     Supine to sit: Min assist;HOB elevated     General bed mobility comments: Min hand held assist to pull up to long sitting in bed with min hand held assist, pt using railing on his right, free-hand side.   Transfers Overall transfer level: Needs assistance Equipment used: None Transfers: Lateral/Scoot Transfers          Lateral/Scoot Transfers: Min assist General transfer comment: Min assist to help pt scoot from bed to drop arm recliner chair level transfer.  assist needed to  help unweight and shift hips laterally.  DOE 2/4, O2 sats and RR rate stable during transfer to the chair. Pt declined wanting to stand with bil prosthesis on today.  Agreeable to try if PT can come tomorrow.          Balance Overall balance assessment: Needs assistance Sitting-balance support: Feet unsupported;Bilateral upper extremity supported Sitting balance-Leahy Scale: Fair Sitting balance - Comments: can prop long sitting in bed with supervision. posterior bias when moving.  Postural control: Posterior lean                                   Pertinent Vitals/Pain Pain Assessment: No/denies pain    Home Living Family/patient expects to be discharged to:: Private residence Living Arrangements: Children;Other relatives (daughter and grandson 37 y.o.) Available Help at Discharge: Family;Available PRN/intermittently (daughter and grandson both work days) Type of Home: House Home Access: Stairs to enter Entrance Stairs-Rails: Left Entrance Stairs-Number of Steps: 2 Home Layout: One level Home Equipment: Environmental consultant - 2 wheels;Wheelchair - manual      Prior Function Level of Independence: Needs assistance   Gait / Transfers Assistance Needed: Pt reports he can usually get EOB , put bil prostheses on, stand with RW and get into his WC, he can get to toilet on his own, he preforms a sponge bath and dresses himself.  He reports that he sleeps most of the day in  his WC because he is up all night.  He doesn't have any appetite.     Comments: He continuously reports he just wants to die, MD made aware.         Extremity/Trunk Assessment   Upper Extremity Assessment Upper Extremity Assessment: Overall WFL for tasks assessed    Lower Extremity Assessment Lower Extremity Assessment: RLE deficits/detail;LLE deficits/detail RLE Deficits / Details: bil LE with BKA, full ROM, can extend lower legs against gravity and lift legs against gravity at least a 3/5 and seemingly  equal bil per gross functional assessment.  Pt declined trying to stand today, so unsure of his functional strength to support his body weight in standing as of yet.  LLE Deficits / Details: bil LE with BKA, full ROM, can extend lower legs against gravity and lift legs against gravity at least a 3/5 and seemingly equal bil per gross functional assessment.  Pt declined trying to stand today, so unsure of his functional strength to support his body weight in standing as of yet.     Cervical / Trunk Assessment Cervical / Trunk Assessment: Normal  Communication   Communication: No difficulties  Cognition Arousal/Alertness: Awake/alert Behavior During Therapy: WFL for tasks assessed/performed Overall Cognitive Status: Within Functional Limits for tasks assessed                                               Assessment/Plan    PT Assessment Patient needs continued PT services  PT Problem List Decreased activity tolerance;Decreased balance;Decreased mobility;Cardiopulmonary status limiting activity       PT Treatment Interventions DME instruction;Gait training;Stair training;Functional mobility training;Therapeutic activities;Balance training;Therapeutic exercise;Patient/family education;Wheelchair mobility training    PT Goals (Current goals can be found in the Care Plan section)  Acute Rehab PT Goals Patient Stated Goal: to feel better and go home PT Goal Formulation: With patient Time For Goal Achievement: 08/28/16 Potential to Achieve Goals: Good    Frequency Min 3X/week   Barriers to discharge Decreased caregiver support pt is often alone at home during the day       AM-PAC PT "6 Clicks" Daily Activity  Outcome Measure Difficulty turning over in bed (including adjusting bedclothes, sheets and blankets)?: Total Difficulty moving from lying on back to sitting on the side of the bed? : Total Difficulty sitting down on and standing up from a chair with arms  (e.g., wheelchair, bedside commode, etc,.)?: Total Help needed moving to and from a bed to chair (including a wheelchair)?: A Little Help needed walking in hospital room?: A Lot Help needed climbing 3-5 steps with a railing? : Total 6 Click Score: 9    End of Session Equipment Utilized During Treatment: Oxygen Activity Tolerance: Patient limited by fatigue Patient left: in chair;with call bell/phone within reach;with chair alarm set Nurse Communication: Mobility status PT Visit Diagnosis: Muscle weakness (generalized) (M62.81);Difficulty in walking, not elsewhere classified (R26.2);Other (comment) (decreased activity tolerance. )    Time: [id number] PT Time Calculation (min) (ACUTE ONLY): 18 min   Charges:        Lurena Joinerebecca B. Sianne Tejada, PT, DPT [id number]#[phone]   PT Evaluation $PT Eval Moderate Complexity: 1 Procedure     08/14/2016, 5:59 PM

## 2016-08-14 NOTE — Progress Notes (Signed)
   Introduced chaplaincy services.  Will follow, as needed.  

## 2016-08-14 NOTE — Progress Notes (Signed)
  Patient ID: Tony DoomHershel Mcclure, male   DOB: Oct 26, 1928, 83 y.o.   MRN: [id number]  This NP visited patient at the bedside with daughter/Terri as a follow up to  yesterday's GOCs meeting.  Continued conversation regarding multiple  co-morbid ites, limitations of medical interventions in face of mortality, and high risk for decompensation considering his co-morbid ites and advanced age.  At this time patient and his family are open to all offered an available medical interventions to prolong life.   --They had specific concerns related to heart monitor placed as OP ( discussed with Dr Katrinka BlazingSmith and he is addressing this)  --Continued concern regarding constipation (discussed with Dr Mahala MenghiniSamtani and initiated Senna-S two tablets qhs prn)  Discussed with patient the importance of continued conversation with family and their  medical providers regarding overall plan of care and treatment options,  ensuring decisions are within the context of the patients values and GOCs.  Questions and concerns addressed  Will need continued support in navigating healthcare options and care needs dependant on outcomes over this hospitalization.  Discussed with Dr Katrinka BlazingSmith and Dr Mahala MenghiniSamtani  Time in  1130       Time out  1205   Total time spent on the unit was 35 minutes   Greater than 50% of the time was spent in counseling and coordination of care  Lorinda CreedMary Carmeron Heady NP  Palliative Medicine Team Team Phone # [phone] Pager [phone]

## 2016-08-14 NOTE — Progress Notes (Addendum)
Progress Note  Patient Name: Tony Mcclure Date of Encounter: 08/14/2016  Primary Cardiologist: New - H. Ozzie Knobel  Subjective   Daughter is in the room at bedside with the patient. They want aggressive care. He states breathing is a little worse today.  Inpatient Medications    Scheduled Meds: . allopurinol  150 mg Oral Daily  . amLODipine  10 mg Oral Daily  . apixaban  2.5 mg Oral BID  . hydrALAZINE  25 mg Oral Q8H  . insulin aspart  0-9 Units Subcutaneous TID WC  . insulin aspart  3 Units Subcutaneous TID WC  . insulin detemir  6 Units Subcutaneous Daily   And  . insulin detemir  6 Units Subcutaneous QHS  . lactulose  20 g Oral BID  . metoprolol tartrate  25 mg Oral BID  . sodium chloride flush  3 mL Intravenous Q12H   Continuous Infusions: . sodium chloride    . albuterol    . ceFEPime (MAXIPIME) IV 1 g (08/13/16 1709)   PRN Meds: sodium chloride, acetaminophen, hydrALAZINE, ipratropium-albuterol, morphine CONCENTRATE, ondansetron (ZOFRAN) IV, senna-docusate, simethicone, sodium chloride flush   Vital Signs    Vitals:   08/14/16 1015 08/14/16 1237 08/14/16 1311 08/14/16 1323  BP: (!) 147/85  (!) 147/89   Pulse: 63     Resp:      Temp:      TempSrc:      SpO2:  96%  98%  Weight:      Height:        Intake/Output Summary (Last 24 hours) at 08/14/16 1331 Last data filed at 08/14/16 1018  Gross per 24 hour  Intake              893 ml  Output              850 ml  Net               43 ml   Filed Weights   08/12/16 0500 08/13/16 0500 08/14/16 0432  Weight: 191 lb 12.8 oz (87 kg) 184 lb 4.9 oz (83.6 kg) 183 lb 6.8 oz (83.2 kg)    Telemetry    Atrial fibrillation with moderate rate control. - Personally Reviewed  ECG    No new recent tracing - Personally Reviewed  Physical Exam  Bilateral above-the-knee amputee sitting upright in bed. Moderate respiratory distress. GEN:  pink skin   Neck:  no obvious JVD Cardiac: IIRR, no murmurs, rubs, or gallops.    Respiratory: Clear to auscultation bilaterally. GI: Soft, nontender, non-distended  MS: No edema; bilateral AKA. Neuro:  Nonfocal  Psych: Normal affect   Labs    Chemistry Recent Labs Lab 08/11/16 1727  08/12/16 0346 08/13/16 0602 08/14/16 0503  NA 138  < > 139 138 140  K 4.0  < > 3.9 3.4* 4.4  CL 106  < > 106 103 106  CO2 17*  --  20* 20* 20*  GLUCOSE 232*  < > 276* 151* 152*  BUN 59*  < > 63* 70* 83*  CREATININE 2.38*  < > 2.52* 2.60* 2.72*  CALCIUM 9.4  --  9.1 9.1 9.3  PROT 6.5  --   --   --  6.6  ALBUMIN 3.7  --   --   --  3.4*  AST 21  --   --   --  28  ALT 23  --   --   --  31  ALKPHOS 89  --   --   --  78  BILITOT 1.3*  --   --   --  1.2  GFRNONAA 23*  --  22* 21* 20*  GFRAA 27*  --  25* 24* 23*  ANIONGAP 15  --  13 15 14   < > = values in this interval not displayed.   Hematology Recent Labs Lab 08/12/16 0346 08/13/16 0602 08/14/16 0503  WBC 6.9 8.4 6.9  RBC 2.98* 3.11* 3.11*  HGB 8.7* 9.1* 9.2*  HCT 27.4* 28.3* 28.5*  MCV 91.9 91.0 91.6  MCH 29.2 29.3 29.6  MCHC 31.8 32.2 32.3  RDW 16.4* 16.4* 16.4*  PLT 125* 144* PLATELET CLUMPS NOTED ON SMEAR, UNABLE TO ESTIMATE    Cardiac Enzymes Recent Labs Lab 08/12/16 0346 08/12/16 0839  TROPONINI 0.71*  0.76* 0.76*    Recent Labs Lab 08/11/16 1743  TROPIPOC 0.15*     BNP Recent Labs Lab 08/11/16 1727  BNP 907.9*     DDimer No results for input(s): DDIMER in the last 168 hours.   Radiology    No results found.  Cardiac Studies   No new data  Patient Profile     83 y.o. male with multiple significant medical problems including type 2 diabetes, stage IV chronic kidney disease, hypertension,, significant anemia requiring transfusion, paroxysmal atrial fibrillation with Chads Vasc> 5, chronic diastolic heart failure, bilateral above-the-knee amputee, thrombocytopenia, admitted with respiratory distress of multifactorial etiology.  Assessment & Plan    1. Hypokalemia,  resolved 2.  Atrial fibrillation,  much better rate control with up titration of beta blocker. Had previously been on 12.5 mg twice a day. 3. Stage IV chronic kidney disease,  worsening renal function with developing azotemia. Diuretic therapy has been discontinued. 4. Acute diastolic heart failure diuretic therapy has been discontinued because of worsening kidney function. 5. DO NOT RESUSCITATE   Uncertain about the overall plan in this gentleman. We will have the event monitor removed. We will sign off for now.   Signed, [name], MD  08/14/2016, 1:31 PM

## 2016-08-15 LAB — COMPREHENSIVE METABOLIC PANEL
ALK PHOS: 74 U/L (ref 38–126)
ALT: 34 U/L (ref 17–63)
AST: 28 U/L (ref 15–41)
Albumin: 3.1 g/dL — ABNORMAL LOW (ref 3.5–5.0)
Anion gap: 12 (ref 5–15)
BILIRUBIN TOTAL: 0.9 mg/dL (ref 0.3–1.2)
BUN: 82 mg/dL — ABNORMAL HIGH (ref 6–20)
CALCIUM: 8.8 mg/dL — AB (ref 8.9–10.3)
CO2: 19 mmol/L — ABNORMAL LOW (ref 22–32)
CREATININE: 2.36 mg/dL — AB (ref 0.61–1.24)
Chloride: 106 mmol/L (ref 101–111)
GFR calc non Af Amer: 23 mL/min — ABNORMAL LOW (ref >60)
GFR, EST AFRICAN AMERICAN: 27 mL/min — AB (ref >60)
GLUCOSE: 90 mg/dL (ref 65–99)
Potassium: 4 mmol/L (ref 3.5–5.1)
Sodium: 137 mmol/L (ref 135–145)
Total Protein: 6 g/dL — ABNORMAL LOW (ref 6.5–8.1)

## 2016-08-15 LAB — CBC
HCT: 29.2 % — ABNORMAL LOW (ref 39.0–52.0)
Hemoglobin: 9.2 g/dL — ABNORMAL LOW (ref 13.0–17.0)
MCH: 29.4 pg (ref 26.0–34.0)
MCHC: 31.5 g/dL (ref 30.0–36.0)
MCV: 93.3 fL (ref 78.0–100.0)
PLATELETS: 159 10*3/uL (ref 150–400)
RBC: 3.13 MIL/uL — ABNORMAL LOW (ref 4.22–5.81)
RDW: 16.4 % — ABNORMAL HIGH (ref 11.5–15.5)
WBC: 6.2 10*3/uL (ref 4.0–10.5)

## 2016-08-15 LAB — CBC AND DIFFERENTIAL
HEMATOCRIT: 29 — AB (ref 41–53)
HEMOGLOBIN: 9.2 — AB (ref 13.5–17.5)
Platelets: 159 (ref 150–399)
WBC: 6.2

## 2016-08-15 LAB — BASIC METABOLIC PANEL
BUN: 82 — AB (ref 4–21)
Creatinine: 2.4 — AB (ref 0.6–1.3)
Glucose: 90
Potassium: 4 (ref 3.4–5.3)
SODIUM: 137 (ref 137–147)

## 2016-08-15 LAB — GLUCOSE, CAPILLARY
Glucose-Capillary: 148 mg/dL — ABNORMAL HIGH (ref 65–99)
Glucose-Capillary: 172 mg/dL — ABNORMAL HIGH (ref 65–99)
Glucose-Capillary: 156 mg/dL — ABNORMAL HIGH (ref 65–99)
Glucose-Capillary: 139 mg/dL — ABNORMAL HIGH (ref 65–99)

## 2016-08-15 LAB — HEPATIC FUNCTION PANEL: BILIRUBIN, TOTAL: 0.9

## 2016-08-15 NOTE — Progress Notes (Signed)
PROGRESS NOTE    Tony Mcclure  ZOX:[id number] DOB: 08-Jun-1928 DOA: 08/11/2016 PCP: Bartholome Bill, MD  Outpatient Specialists:     Brief Narrative:  72 ? Recent newly diagnosed permanent atrial fibrillation-Chad score >4 on Elliquis  Complicated by sinus bradycardia during recent hospital stay 5/19-5/21 Prior CABG Type 2 diabetes mellitus Chronic kidney disease stage IV HTN Peripheral arterial/vascular disease with bilateral TKA-? Secondary to diabetic nephropathy Normochromic Anemia ? AOCD   Transfused 2 units PRBC recent admission  Documented mild thrombocytopenia-unclear etiology    Admitted 08/11/2016 with potential healthcare associated pneumonia the V S 80 CHF diastolic, BNP 811, CXR = interstitial edema, clinically JVD elevated  On admission required to stepdown status, BiPAP, was febrile heart rate 120s troponin elevated 0.15 CXR possible L M L PNA   Assessment & Plan:   Principal Problem:   Sepsis ([place]) Active Problems:   Normocytic anemia   Type 2 diabetes mellitus ([place])   Essential hypertension   S/P bilateral below knee amputation (HCC)   Paroxysmal A-fib (HCC)   HCAP (healthcare-associated pneumonia)   Thrombocytopenia (HCC)   Acute diastolic heart failure (HCC)   CKD (chronic kidney disease), stage IV ([place])   Palliative care by specialist   DNR (do not resuscitate)   Dyspnea   Constipation   Adult failure to thrive   Multifactorial grade C MMR C dyspnea Acute hypoxic respiratory failure requiring BiPAP initially on admission Component of AECHF diastolic Healthcare associated pneumonia  Continue IV antibiotics cefepime- trend white count 11--6.  improved Lasix 40 iv bid held 6/7  -minus 3.0 liters Okay to probably discontinue Foley if patient able to void  required bipap up till 6/5--)2 Sat 100%, decreased Nasal canula and wean as tol continue roxanol 5 mg q4 prn for WOB  Situational depression Long discussion 6/6 -Has felt despondent  since daughter passed away and family left on vacation and felt like "ending it all" since the past month -PHQ-2 +. Full scope care but DNR Will continue to delineate goals Thank you for assistance pallaitive care  Moderately controlled HTN Blood pressure 914-782 systolic Continue Lopressor 25 twice a day, amlodipine 10 daily, ACE  contraindicated given CKD 4 Home meds of minoxidil 2.5 twice a day on hold  increase hydralazine to 50 3 times a day 6/7 with better control  History of CABG Troponins 0.76 with flat trend Cardiology consulted and did not feel this is ischemic related rather type II demand ischemia related to troponin leak/CK D IV Have signed off  Chronic kidney disease stage IV Metabolic/renal acidosis GFR stabilized at 23 and 25 Might need at some point discussion with nephrology although patient is DO NOT RESUSCITATE and will need to discuss this  Persistent atrial fibrillation [place] score 4 Probable sick sinus syndrome given bradycardia, age Continue metoprolol as above Continue Elliquis 2.5 cautiously given recent AOCD and transfusion  recent Hemoccult stool negative  Diabetes mellitus type 2-complications of neuropathy, wound and bilateral BKA Hold Tradjenta 5 daily, cut back 2/2 to hypoglycemia 12-14-->6 U bid Levemir  Sugar 148---176 Continue sliding scale coverage as well as 3 units regular insulin twice a day meals  ? Gout Patient on allopurinol 300 at renal dosing  Hyperlipidemia continue Zocor 40 daily  Abdominal distension Constipation continue Lactulose 30 PMT added Senna KUB 6/7-no SBO, ++flatus monitor    DVT prophylaxis: eliquis Code Status: DNR--? Hopsice? Family Communication: called daughter and LM Disposition Plan: transfer tele.  Need SNF   Consultants:   Cardiology  Palliative care  Chaplain  Procedures:     Antimicrobials:   Vancomycin 6/4-->6/6  Cefepime 6/4   Subjective:  Much more  awake Responsive Happier appearing Eating some No stool yet   Objective: Vitals:   08/15/16 0918 08/15/16 1100 08/15/16 1220 08/15/16 1600  BP: (!) 151/78  (!) 119/51 (!) 137/55  Pulse: (!) 101  (!) 59   Resp:   20   Temp:  97.8 F (36.6 C) 97.8 F (36.6 C) 98.3 F (36.8 C)  TempSrc:  Oral Oral Oral  SpO2:   100%   Weight:      Height:        Intake/Output Summary (Last 24 hours) at 08/15/16 1647 Last data filed at 08/15/16 1646  Gross per 24 hour  Intake                0 ml  Output              925 ml  Net             -925 ml   Filed Weights   08/13/16 0500 08/14/16 0432 08/15/16 0348  Weight: 83.6 kg (184 lb 4.9 oz) 83.2 kg (183 lb 6.8 oz) 84.5 kg (186 lb 4.6 oz)    Examination:  Pleasant awake oriented in nad eomi ncat Mild rales No other issue abd distedned no rebound no gaurd  Data Reviewed: I have personally reviewed following labs and imaging studies  CBC:  Recent Labs Lab 08/11/16 1727 08/11/16 1743 08/12/16 0346 08/13/16 0602 08/14/16 0503 08/15/16 0316  WBC 11.7*  --  6.9 8.4 6.9 6.2  NEUTROABS 10.0*  --  5.3  --  5.2  --   HGB 9.7* 9.9* 8.7* 9.1* 9.2* 9.2*  HCT 29.5* 29.0* 27.4* 28.3* 28.5* 29.2*  MCV 92.5  --  91.9 91.0 91.6 93.3  PLT 139*  --  125* 144* PLATELET CLUMPS NOTED ON SMEAR, UNABLE TO ESTIMATE 416   Basic Metabolic Panel:  Recent Labs Lab 08/12/16 0346 08/13/16 0602 08/14/16 0503 08/14/16 2227 08/15/16 0316  NA 139 138 140 138 137  K 3.9 3.4* 4.4 3.8 4.0  CL 106 103 106 106 106  CO2 20* 20* 20* 22 19*  GLUCOSE 276* 151* 152* 109* 90  BUN 63* 70* 83* 81* 82*  CREATININE 2.52* 2.60* 2.72* 2.48* 2.36*  CALCIUM 9.1 9.1 9.3 9.2 8.8*  MG  --   --   --  2.4  --    GFR: Estimated Creatinine Clearance: 22.9 mL/min (A) (by C-G formula based on SCr of 2.36 mg/dL (H)). Liver Function Tests:  Recent Labs Lab 08/11/16 1727 08/14/16 0503 08/15/16 0316  AST 21 28 28   ALT 23 31 34  ALKPHOS 89 78 74  BILITOT 1.3* 1.2  0.9  PROT 6.5 6.6 6.0*  ALBUMIN 3.7 3.4* 3.1*    Recent Labs Lab 08/11/16 1727  LIPASE 17   No results for input(s): AMMONIA in the last 168 hours. Coagulation Profile: No results for input(s): INR, PROTIME in the last 168 hours. Cardiac Enzymes:  Recent Labs Lab 08/12/16 0346 08/12/16 0839  TROPONINI 0.71*  0.76* 0.76*   BNP (last 3 results) No results for input(s): PROBNP in the last 8760 hours. HbA1C: No results for input(s): HGBA1C in the last 72 hours. CBG:  Recent Labs Lab 08/14/16 1235 08/14/16 1607 08/14/16 2154 08/15/16 0925 08/15/16 1217  GLUCAP 229* 168* 123* 148* 172*   Lipid Profile: No results for input(s): CHOL, HDL,  LDLCALC, TRIG, CHOLHDL, LDLDIRECT in the last 72 hours. Thyroid Function Tests: No results for input(s): TSH, T4TOTAL, FREET4, T3FREE, THYROIDAB in the last 72 hours. Anemia Panel: No results for input(s): VITAMINB12, FOLATE, FERRITIN, TIBC, IRON, RETICCTPCT in the last 72 hours. Urine analysis:    Component Value Date/Time   COLORURINE YELLOW 08/12/2016 0617   APPEARANCEUR CLOUDY (A) 08/12/2016 0617   LABSPEC 1.011 08/12/2016 0617   PHURINE 6.0 08/12/2016 0617   GLUCOSEU NEGATIVE 08/12/2016 0617   HGBUR SMALL (A) 08/12/2016 0617   BILIRUBINUR NEGATIVE 08/12/2016 0617   KETONESUR NEGATIVE 08/12/2016 0617   PROTEINUR 100 (A) 08/12/2016 0617   NITRITE NEGATIVE 08/12/2016 0617   LEUKOCYTESUR NEGATIVE 08/12/2016 0617   Sepsis Labs: @LABRCNTIP (procalcitonin:4,lacticidven:4)  ) Recent Results (from the past 240 hour(s))  MRSA PCR Screening     Status: None   Collection Time: 08/11/16 10:55 PM  Result Value Ref Range Status   MRSA by PCR NEGATIVE NEGATIVE Final    Comment:        The GeneXpert MRSA Assay (FDA approved for NASAL specimens only), is one component of a comprehensive MRSA colonization surveillance program. It is not intended to diagnose MRSA infection nor to guide or monitor treatment for MRSA infections.           Radiology Studies: Dg Abd Portable 1v  Result Date: 08/14/2016 CLINICAL DATA:  Constipation. EXAM: PORTABLE ABDOMEN - 1 VIEW COMPARISON:  08/11/2016.  07/26/2016. FINDINGS: AP portable supine view the abdomen shows diffuse gaseous distention of large and small bowel, similar to slightly progressed in the interval. This is similar and perhaps slightly progressed in terms of colonic gas when comparing to 07/26/2016 as well. Degenerative changes noted lumbar spine. IMPRESSION: Diffuse gaseous distention of large and small bowel with slightly more gaseous colonic distention on today's exam than on the prior studies. Electronically Signed   By: Misty Stanley M.D.   On: 08/14/2016 16:07        Scheduled Meds: . allopurinol  150 mg Oral Daily  . amLODipine  10 mg Oral Daily  . apixaban  2.5 mg Oral BID  . hydrALAZINE  50 mg Oral Q8H  . insulin aspart  0-9 Units Subcutaneous TID WC  . insulin aspart  3 Units Subcutaneous TID WC  . insulin detemir  6 Units Subcutaneous Daily   And  . insulin detemir  6 Units Subcutaneous QHS  . lactulose  20 g Oral BID  . metoprolol tartrate  25 mg Oral BID  . sodium chloride flush  3 mL Intravenous Q12H   Continuous Infusions: . sodium chloride    . albuterol    . ceFEPime (MAXIPIME) IV 1 g (08/14/16 1745)     LOS: 4 days    Time spent: [place], MD [facility]   If 7PM-7AM, please contact night-coverage [email or web address] Password [facility] 08/15/2016, 4:47 PM

## 2016-08-15 NOTE — Care Management Important Message (Signed)
Important Message  Patient Details  Name: Tony Mcclure MRN: [id number] Date of Birth: 1929-03-05   Medicare Important Message Given:  Yes    [name] Abena 08/15/2016, 11:48 AM

## 2016-08-15 NOTE — Progress Notes (Signed)
Physical Therapy Treatment Patient Details Name: Tony Mcclure MRN: [id number] DOB: 1928-09-21 Today's Date: 08/15/2016    History of Present Illness 83 y.o. male admitted to [facility] on 08/11/16 for SOB.  Dx with acute hypoxic respiratory failure requiring BiPAP initially, acute on chronic CHF (diastolic), and HCAP, elevated troponin thought to be demand ischemia, and metabolic acidosis.  Pt with other significant PMhx of bil BKA, HTN, DM, CAD, and CABG.     PT Comments    Pt able to don prosthetics and stand and walk short distance with assist. Continue to recommend ST-SNF since pt will be alone at home during the day.   Follow Up Recommendations  SNF     Equipment Recommendations  None recommended by PT    Recommendations for Other Services       Precautions / Restrictions Precautions Precautions: Fall Precaution Comments: bil BKA Required Braces or Orthoses: Other Brace/Splint Other Brace/Splint: bil prosthesis here in room.  Restrictions Weight Bearing Restrictions: No    Mobility  Bed Mobility Overal bed mobility: Needs Assistance Bed Mobility: Supine to Sit     Supine to sit: Min assist;HOB elevated     General bed mobility comments: Assist to initially elevate trunk into sitting  Transfers Overall transfer level: Needs assistance Equipment used: Rolling walker (2 wheeled) Transfers: Sit to/from Stand Sit to Stand: +2 physical assistance;Min assist         General transfer comment: Assist to bring hips and trunk up. Assist to push prothetics into more knee flexion to bring feet back in preparation to stand.   Ambulation/Gait Ambulation/Gait assistance: +2 physical assistance;Min assist Ambulation Distance (Feet): 3 Feet Assistive device: Rolling walker (2 wheeled) Gait Pattern/deviations: Step-through pattern;Decreased step length - right;Decreased step length - left;Trunk flexed Gait velocity: decr Gait velocity interpretation: Below normal speed for  age/gender General Gait Details: Pt with unsteady gait requiring assist for balance and support. Pt on O2 and SpO2 and HR stable throughout.   Stairs            Wheelchair Mobility    Modified Rankin (Stroke Patients Only)       Balance Overall balance assessment: Needs assistance Sitting-balance support: Feet unsupported;Bilateral upper extremity supported Sitting balance-Leahy Scale: Good Sitting balance - Comments: can prop long sitting in bed with supervision. posterior bias when moving.    Standing balance support: Bilateral upper extremity supported Standing balance-Leahy Scale: Poor Standing balance comment: walker and min A for static standing                            Cognition Arousal/Alertness: Awake/alert Behavior During Therapy: WFL for tasks assessed/performed Overall Cognitive Status: Within Functional Limits for tasks assessed                                        Exercises      General Comments        Pertinent Vitals/Pain Pain Assessment: No/denies pain    Home Living                      Prior Function            PT Goals (current goals can now be found in the care plan section) Progress towards PT goals: Progressing toward goals    Frequency    Min 3X/week  PT Plan Current plan remains appropriate    Co-evaluation              AM-PAC PT "6 Clicks" Daily Activity  Outcome Measure  Difficulty turning over in bed (including adjusting bedclothes, sheets and blankets)?: Total Difficulty moving from lying on back to sitting on the side of the bed? : Total Difficulty sitting down on and standing up from a chair with arms (e.g., wheelchair, bedside commode, etc,.)?: Total Help needed moving to and from a bed to chair (including a wheelchair)?: A Little Help needed walking in hospital room?: A Lot Help needed climbing 3-5 steps with a railing? : Total 6 Click Score: 9    End of  Session Equipment Utilized During Treatment: Oxygen;Gait belt Activity Tolerance: Patient tolerated treatment well Patient left: in chair;with call bell/phone within reach;with chair alarm set Nurse Communication: Mobility status PT Visit Diagnosis: Muscle weakness (generalized) (M62.81);Difficulty in walking, not elsewhere classified (R26.2)     Time: [id number] PT Time Calculation (min) (ACUTE ONLY): 19 min  Charges:  $Therapeutic Activity: 8-22 mins                    G Codes:       [facility] Morna Flud PT [phone]    Angelina OkCary W [facility] 08/15/2016, 2:16 PM

## 2016-08-15 NOTE — Progress Notes (Signed)
Patient to transfer to 3E-08. [facility]

## 2016-08-16 LAB — GLUCOSE, CAPILLARY
GLUCOSE-CAPILLARY: 165 mg/dL — AB (ref 65–99)
GLUCOSE-CAPILLARY: 170 mg/dL — AB (ref 65–99)
GLUCOSE-CAPILLARY: 169 mg/dL — AB (ref 65–99)
Glucose-Capillary: 104 mg/dL — ABNORMAL HIGH (ref 65–99)
Glucose-Capillary: 140 mg/dL — ABNORMAL HIGH (ref 65–99)

## 2016-08-16 LAB — CBC WITH DIFFERENTIAL/PLATELET
Basophils Absolute: 0 10*3/uL (ref 0.0–0.1)
Basophils Relative: 0 %
EOS PCT: 5 %
Eosinophils Absolute: 0.3 10*3/uL (ref 0.0–0.7)
HCT: 25.7 % — ABNORMAL LOW (ref 39.0–52.0)
Hemoglobin: 8.2 g/dL — ABNORMAL LOW (ref 13.0–17.0)
LYMPHS ABS: 1.2 10*3/uL (ref 0.7–4.0)
LYMPHS PCT: 21 %
MCH: 29.1 pg (ref 26.0–34.0)
MCHC: 31.9 g/dL (ref 30.0–36.0)
MCV: 91.1 fL (ref 78.0–100.0)
Monocytes Absolute: 0.5 10*3/uL (ref 0.1–1.0)
Monocytes Relative: 9 %
NEUTROS ABS: 3.5 10*3/uL (ref 1.7–7.7)
Neutrophils Relative %: 65 %
PLATELETS: 158 10*3/uL (ref 150–400)
RBC: 2.82 MIL/uL — AB (ref 4.22–5.81)
RDW: 16.1 % — ABNORMAL HIGH (ref 11.5–15.5)
WBC: 5.4 10*3/uL (ref 4.0–10.5)

## 2016-08-16 LAB — BASIC METABOLIC PANEL
Anion gap: 10 (ref 5–15)
BUN: 82 mg/dL — AB (ref 6–20)
BUN: 82 — AB (ref 4–21)
CHLORIDE: 105 mmol/L (ref 101–111)
CO2: 20 mmol/L — ABNORMAL LOW (ref 22–32)
CREATININE: 2.3 — AB (ref 0.6–1.3)
Calcium: 8.7 mg/dL — ABNORMAL LOW (ref 8.9–10.3)
Creatinine, Ser: 2.34 mg/dL — ABNORMAL HIGH (ref 0.61–1.24)
GFR calc Af Amer: 27 mL/min — ABNORMAL LOW (ref >60)
GFR calc non Af Amer: 24 mL/min — ABNORMAL LOW (ref >60)
GLUCOSE: 161 mg/dL — AB (ref 65–99)
Glucose: 161
POTASSIUM: 3.9 mmol/L (ref 3.5–5.1)
Potassium: 3.9 (ref 3.4–5.3)
Sodium: 135 mmol/L (ref 135–145)
Sodium: 135 — AB (ref 137–147)

## 2016-08-16 LAB — CBC AND DIFFERENTIAL
HCT: 26 — AB (ref 41–53)
Hemoglobin: 8.2 — AB (ref 13.5–17.5)
PLATELETS: 158 (ref 150–399)
WBC: 5.4

## 2016-08-16 MED ORDER — HYDROCORTISONE 2.5 % RE CREA
TOPICAL_CREAM | Freq: Two times a day (BID) | RECTAL | Status: DC
  Administered 2016-08-16 – 2016-08-18 (×5): via RECTAL
  Filled 2016-08-16: qty 28.35

## 2016-08-16 MED ORDER — GERHARDT'S BUTT CREAM
TOPICAL_CREAM | Freq: Two times a day (BID) | CUTANEOUS | Status: DC
  Administered 2016-08-16 – 2016-08-17 (×3): via TOPICAL
  Administered 2016-08-17 – 2016-08-18 (×2): 1 via TOPICAL
  Filled 2016-08-16: qty 1

## 2016-08-16 MED ORDER — FLEET ENEMA 7-19 GM/118ML RE ENEM
1.0000 | ENEMA | Freq: Once | RECTAL | Status: AC
  Administered 2016-08-16: 1 via RECTAL
  Filled 2016-08-16: qty 1

## 2016-08-16 NOTE — Progress Notes (Signed)
Oxygen is weaned down to 2l from 6L, oxygen sat is 96% this time at 2l, will continue to monitor

## 2016-08-16 NOTE — Progress Notes (Signed)
PROGRESS NOTE    Tony Mcclure  UEA:[id number] DOB: February 24, 1929 DOA: 08/11/2016 PCP: Bartholome Bill, MD  Outpatient Specialists:     Brief Narrative:  76 ? Recent newly diagnosed permanent atrial fibrillation-Chad score >4 on Elliquis  Complicated by sinus bradycardia during recent hospital stay 5/19-5/21 Prior CABG Type 2 diabetes mellitus Chronic kidney disease stage IV HTN Peripheral arterial/vascular disease with bilateral TKA-? Secondary to diabetic nephropathy Normochromic Anemia ? AOCD   Transfused 2 units PRBC recent admission  Documented mild thrombocytopenia-unclear etiology    Admitted 08/11/2016 with potential healthcare associated pneumonia the V S 80 CHF diastolic, BNP 478, CXR = interstitial edema, clinically JVD elevated  On admission required to stepdown status, BiPAP, was febrile heart rate 120s troponin elevated 0.15 CXR possible L M L PNA   Assessment & Plan:   Principal Problem:   Sepsis ([place]) Active Problems:   Normocytic anemia   Type 2 diabetes mellitus ([place])   Essential hypertension   S/P bilateral below knee amputation (HCC)   Paroxysmal A-fib (HCC)   HCAP (healthcare-associated pneumonia)   Thrombocytopenia (HCC)   Acute diastolic heart failure (HCC)   CKD (chronic kidney disease), stage IV ([place])   Palliative care by specialist   DNR (do not resuscitate)   Dyspnea   Constipation   Adult failure to thrive   Multifactorial grade C MMR C dyspnea Acute hypoxic respiratory failure requiring BiPAP initially on admission Component of AECHF diastolic Healthcare associated pneumonia  Continue IV antibiotics cefepime- trend white count 11--6.  improved Lasix 40 iv bid held 6/7 and no further need for the same  -minus 3.7 liters Okay to probably discontinue Foley if patient able to void  required bipap up till 6/5--02 Sat 100%, decreased Nasal canula and wean as tol continue roxanol 5 mg q4 prn for WOB  Situational depression Long  discussion 6/6 -Has felt despondent since daughter passed away and family left on vacation and felt like "ending it all" since the past month -PHQ-2 +. Full scope care but DNR  Moderately controlled HTN Blood pressure 295-621 systolic Continue Lopressor 25 twice a day, amlodipine 10 daily, ACE  contraindicated given CKD 4 Home meds of minoxidil 2.5 twice a day on hold  increase hydralazine to 50 3 times a day 6/7 with better control  History of CABG Troponins 0.76 with flat trend Cardiology consulted  Have signed off  Chronic kidney disease stage IV Metabolic/renal acidosis GFR stabilized at 23 and 25 Might need at some point discussion with nephrology although patient is DO NOT RESUSCITATE and will need to discuss this as op  Persistent atrial fibrillation [place] score 4 Probable sick sinus syndrome given bradycardia, age Continue metoprolol as above Continue Elliquis 2.5 cautiously given recent AOCD and transfusion  recent Hemoccult stool negative  Diabetes mellitus type 2-complications of neuropathy, wound and bilateral BKA Hold Tradjenta 5 daily, cut back 2/2 to hypoglycemia 12-14-->6 U bid Levemir  Sugar 165-139 Continue sliding scale coverage as well as 3 units regular insulin twice a day meals  ? Gout Patient on allopurinol 300 at renal dosing  Hyperlipidemia continue Zocor 40 daily  Abdominal distension Constipation  3 oclock external hemorrhoid continue Lactulose 30 PMT added Senna KUB 6/7-no SBO, ++flatus anusol Adding Enema    DVT prophylaxis: eliquis Code Status: DNR--? SNF 6/11 Family Communication: called daughter and LM Disposition Plan: transfer tele.  Need SNF   Consultants:   Cardiology  Palliative care  Chaplain  Procedures:     Antimicrobials:  Vancomycin 6/4-->6/6  Cefepime 6/4   Subjective:  Much more awake C/o rectal pain, persisitng constipation and soreness in bottom Breathing much better No n/v Less sad or  depressed and doesn't wish to "end it all" anymore   Objective: Vitals:   08/15/16 2019 08/15/16 2056 08/16/16 0203 08/16/16 0625  BP: (!) 144/70 (!) 145/51 (!) 151/80 126/69  Pulse: 88 76 62 70  Resp: 17 19  19   Temp: 98.3 F (36.8 C) 98 F (36.7 C) 97.8 F (36.6 C) 98 F (36.7 C)  TempSrc: Oral Oral Oral Oral  SpO2: 100% 98% 100% 100%  Weight:    86.4 kg (190 lb 6.4 oz)  Height:        Intake/Output Summary (Last 24 hours) at 08/16/16 0856 Last data filed at 08/16/16 4944  Gross per 24 hour  Intake              890 ml  Output             2025 ml  Net            -1135 ml   Filed Weights   08/14/16 0432 08/15/16 0348 08/16/16 0625  Weight: 83.2 kg (183 lb 6.8 oz) 84.5 kg (186 lb 4.6 oz) 86.4 kg (190 lb 6.4 oz)    Examination:  Pleasant awake oriented in nad eomi ncat Mild rales abd sodt bloated nt no rebound-3 olcock hemorrhoid stag1 1 skin abrasion No other issue abd distedned no rebound no gaurd  Data Reviewed: I have personally reviewed following labs and imaging studies  CBC:  Recent Labs Lab 08/11/16 1727  08/12/16 0346 08/13/16 0602 08/14/16 0503 08/15/16 0316 08/16/16 0521  WBC 11.7*  --  6.9 8.4 6.9 6.2 5.4  NEUTROABS 10.0*  --  5.3  --  5.2  --  3.5  HGB 9.7*  < > 8.7* 9.1* 9.2* 9.2* 8.2*  HCT 29.5*  < > 27.4* 28.3* 28.5* 29.2* 25.7*  MCV 92.5  --  91.9 91.0 91.6 93.3 91.1  PLT 139*  --  125* 144* PLATELET CLUMPS NOTED ON SMEAR, UNABLE TO ESTIMATE 159 158  < > = values in this interval not displayed. Basic Metabolic Panel:  Recent Labs Lab 08/13/16 0602 08/14/16 0503 08/14/16 2227 08/15/16 0316 08/16/16 0521  NA 138 140 138 137 135  K 3.4* 4.4 3.8 4.0 3.9  CL 103 106 106 106 105  CO2 20* 20* 22 19* 20*  GLUCOSE 151* 152* 109* 90 161*  BUN 70* 83* 81* 82* 82*  CREATININE 2.60* 2.72* 2.48* 2.36* 2.34*  CALCIUM 9.1 9.3 9.2 8.8* 8.7*  MG  --   --  2.4  --   --    GFR: Estimated Creatinine Clearance: 23.3 mL/min (A) (by C-G formula  based on SCr of 2.34 mg/dL (H)). Liver Function Tests:  Recent Labs Lab 08/11/16 1727 08/14/16 0503 08/15/16 0316  AST 21 28 28   ALT 23 31 34  ALKPHOS 89 78 74  BILITOT 1.3* 1.2 0.9  PROT 6.5 6.6 6.0*  ALBUMIN 3.7 3.4* 3.1*    Recent Labs Lab 08/11/16 1727  LIPASE 17   No results for input(s): AMMONIA in the last 168 hours. Coagulation Profile: No results for input(s): INR, PROTIME in the last 168 hours. Cardiac Enzymes:  Recent Labs Lab 08/12/16 0346 08/12/16 0839  TROPONINI 0.71*  0.76* 0.76*   BNP (last 3 results) No results for input(s): PROBNP in the last 8760 hours. HbA1C: No results for input(s):  HGBA1C in the last 72 hours. CBG:  Recent Labs Lab 08/15/16 1217 08/15/16 1642 08/15/16 2101 08/16/16 0516 08/16/16 0726  GLUCAP 172* 156* 139* 165* 170*   Lipid Profile: No results for input(s): CHOL, HDL, LDLCALC, TRIG, CHOLHDL, LDLDIRECT in the last 72 hours. Thyroid Function Tests: No results for input(s): TSH, T4TOTAL, FREET4, T3FREE, THYROIDAB in the last 72 hours. Anemia Panel: No results for input(s): VITAMINB12, FOLATE, FERRITIN, TIBC, IRON, RETICCTPCT in the last 72 hours. Urine analysis:    Component Value Date/Time   COLORURINE YELLOW 08/12/2016 0617   APPEARANCEUR CLOUDY (A) 08/12/2016 0617   LABSPEC 1.011 08/12/2016 0617   PHURINE 6.0 08/12/2016 0617   GLUCOSEU NEGATIVE 08/12/2016 0617   HGBUR SMALL (A) 08/12/2016 0617   BILIRUBINUR NEGATIVE 08/12/2016 0617   KETONESUR NEGATIVE 08/12/2016 0617   PROTEINUR 100 (A) 08/12/2016 0617   NITRITE NEGATIVE 08/12/2016 0617   LEUKOCYTESUR NEGATIVE 08/12/2016 0617   Sepsis Labs: @LABRCNTIP (procalcitonin:4,lacticidven:4)  ) Recent Results (from the past 240 hour(s))  MRSA PCR Screening     Status: None   Collection Time: 08/11/16 10:55 PM  Result Value Ref Range Status   MRSA by PCR NEGATIVE NEGATIVE Final    Comment:        The GeneXpert MRSA Assay (FDA approved for NASAL  specimens only), is one component of a comprehensive MRSA colonization surveillance program. It is not intended to diagnose MRSA infection nor to guide or monitor treatment for MRSA infections.          Radiology Studies: Dg Abd Portable 1v  Result Date: 08/14/2016 CLINICAL DATA:  Constipation. EXAM: PORTABLE ABDOMEN - 1 VIEW COMPARISON:  08/11/2016.  07/26/2016. FINDINGS: AP portable supine view the abdomen shows diffuse gaseous distention of large and small bowel, similar to slightly progressed in the interval. This is similar and perhaps slightly progressed in terms of colonic gas when comparing to 07/26/2016 as well. Degenerative changes noted lumbar spine. IMPRESSION: Diffuse gaseous distention of large and small bowel with slightly more gaseous colonic distention on today's exam than on the prior studies. Electronically Signed   By: Misty Stanley M.D.   On: 08/14/2016 16:07        Scheduled Meds: . allopurinol  150 mg Oral Daily  . amLODipine  10 mg Oral Daily  . apixaban  2.5 mg Oral BID  . Gerhardt's butt cream   Topical BID  . hydrALAZINE  50 mg Oral Q8H  . hydrocortisone   Rectal BID  . insulin aspart  0-9 Units Subcutaneous TID WC  . insulin aspart  3 Units Subcutaneous TID WC  . insulin detemir  6 Units Subcutaneous Daily   And  . insulin detemir  6 Units Subcutaneous QHS  . lactulose  20 g Oral BID  . metoprolol tartrate  25 mg Oral BID  . sodium chloride flush  3 mL Intravenous Q12H  . sodium phosphate  1 enema Rectal Once   Continuous Infusions: . sodium chloride    . albuterol    . ceFEPime (MAXIPIME) IV Stopped (08/15/16 1747)     LOS: 5 days    Time spent: [place], MD [facility]   If 7PM-7AM, please contact night-coverage [email or web address] Password [email or web address] 08/16/2016, 8:56 AM

## 2016-08-17 ENCOUNTER — Inpatient Hospital Stay (HOSPITAL_COMMUNITY): Payer: Medicare Other

## 2016-08-17 DIAGNOSIS — R627 Adult failure to thrive: Secondary | ICD-10-CM

## 2016-08-17 DIAGNOSIS — K5900 Constipation, unspecified: Secondary | ICD-10-CM

## 2016-08-17 DIAGNOSIS — N183 Chronic kidney disease, stage 3 (moderate): Secondary | ICD-10-CM

## 2016-08-17 DIAGNOSIS — Z89512 Acquired absence of left leg below knee: Secondary | ICD-10-CM

## 2016-08-17 DIAGNOSIS — D696 Thrombocytopenia, unspecified: Secondary | ICD-10-CM

## 2016-08-17 DIAGNOSIS — E1122 Type 2 diabetes mellitus with diabetic chronic kidney disease: Secondary | ICD-10-CM

## 2016-08-17 DIAGNOSIS — D649 Anemia, unspecified: Secondary | ICD-10-CM

## 2016-08-17 DIAGNOSIS — J189 Pneumonia, unspecified organism: Secondary | ICD-10-CM

## 2016-08-17 DIAGNOSIS — Z89511 Acquired absence of right leg below knee: Secondary | ICD-10-CM

## 2016-08-17 DIAGNOSIS — Z794 Long term (current) use of insulin: Secondary | ICD-10-CM

## 2016-08-17 LAB — CBC
HCT: 26.6 % — ABNORMAL LOW (ref 39.0–52.0)
Hemoglobin: 8.6 g/dL — ABNORMAL LOW (ref 13.0–17.0)
MCH: 29.9 pg (ref 26.0–34.0)
MCHC: 32.3 g/dL (ref 30.0–36.0)
MCV: 92.4 fL (ref 78.0–100.0)
PLATELETS: 166 10*3/uL (ref 150–400)
RBC: 2.88 MIL/uL — ABNORMAL LOW (ref 4.22–5.81)
RDW: 16.2 % — AB (ref 11.5–15.5)
WBC: 5.7 10*3/uL (ref 4.0–10.5)

## 2016-08-17 LAB — BASIC METABOLIC PANEL
ANION GAP: 9 (ref 5–15)
BUN: 76 — AB (ref 4–21)
BUN: 76 mg/dL — ABNORMAL HIGH (ref 6–20)
CALCIUM: 8.8 mg/dL — AB (ref 8.9–10.3)
CO2: 19 mmol/L — ABNORMAL LOW (ref 22–32)
CREATININE: 2.09 mg/dL — AB (ref 0.61–1.24)
Chloride: 105 mmol/L (ref 101–111)
Creatinine: 2.1 — AB (ref 0.6–1.3)
GFR calc Af Amer: 31 mL/min — ABNORMAL LOW (ref >60)
GFR, EST NON AFRICAN AMERICAN: 27 mL/min — AB (ref >60)
GLUCOSE: 219
Glucose, Bld: 219 mg/dL — ABNORMAL HIGH (ref 65–99)
POTASSIUM: 4 (ref 3.4–5.3)
Potassium: 4 mmol/L (ref 3.5–5.1)
Sodium: 133 mmol/L — ABNORMAL LOW (ref 135–145)
Sodium: 133 — AB (ref 137–147)

## 2016-08-17 LAB — GLUCOSE, CAPILLARY
GLUCOSE-CAPILLARY: 244 mg/dL — AB (ref 65–99)
GLUCOSE-CAPILLARY: 210 mg/dL — AB (ref 65–99)
Glucose-Capillary: 143 mg/dL — ABNORMAL HIGH (ref 65–99)
Glucose-Capillary: 290 mg/dL — ABNORMAL HIGH (ref 65–99)

## 2016-08-17 LAB — PROCALCITONIN: Procalcitonin: 0.71 ng/mL

## 2016-08-17 LAB — CBC AND DIFFERENTIAL: WBC: 5.7

## 2016-08-17 MED ORDER — ACETAMINOPHEN 325 MG PO TABS: 650.0000 mg | ORAL_TABLET | Freq: Four times a day (QID) | ORAL | Status: DC | PRN

## 2016-08-17 NOTE — Progress Notes (Signed)
PROGRESS NOTE   Tony Mcclure  ZHY:[id number]    DOB: 10-Sep-1928    DOA: 08/11/2016  PCP: [name], [name], MD   I have briefly reviewed patients previous medical records in [facility].  Brief Narrative:  83 year old male, lives at home, moves around with the help of a wheelchair, not on home oxygen, PMH of HTN, CAD, DM 2, stage IV chronic kidney disease, A. fib on Eliquis, recent hospitalization 5/19-5/21 for new onset A. fib, symptomatic anemia (transfused 2 units PRBCs), since discharge not completely recovered, treated for bronchitis with doxycycline, presented with progressive dyspnea, mostly nonproductive cough and abdominal distention and pain. Hypoxic by EMS at 86% on room air. Required BiPAP in ED. Febrile 101.4, tachycardic, tachypnea, hypertensive and hypoxic in ED. Chest x-ray suggested pulmonary edema, possible left middle lobe pneumonia and sepsis protocol initiated.   Assessment & Plan:   Principal Problem:   HCAP (healthcare-associated pneumonia) Active Problems:   Normocytic anemia   Type 2 diabetes mellitus (HCC)   Essential hypertension   S/P bilateral below knee amputation (HCC)   Paroxysmal A-fib (HCC)   Acute diastolic heart failure (HCC)   CKD (chronic kidney disease), stage IV (HCC)   Palliative care by specialist   DNR (do not resuscitate)   Constipation   Adult failure to thrive   1. Possible Healthcare associated pneumonia, left midlung: Urine pneumococcal and Legionella antigen negative. MRSA PCR negative. Unfortunately no cultures were sent on admission and patient had mild leukocytosis which has resolved. Defervesced. Completed 6 days of IV cefepime. Vancomycin was discontinued after 2 doses. Follow-up repeat chest x-ray. Check pro calcitonin, if negative DC all antibiotics versus complete total 7 days treatment. 2. Sepsis, present on admission: May be related to pneumonia. Sepsis physiology resolved. 3. Acute respiratory failure with hypoxia:  Secondary to decompensated CHF and pneumonia. Not on home oxygen prior to admission. Improving. Wean off of oxygen as tolerated. Currently saturating in the 90s on 1 L/m. Required BiPAP in the ED, now off. 4. Acute diastolic CHF: Briefly treated with diuretic therapy which was discontinued due to worsening renal functions. Euvolemic at this time. 5. Essential hypertension: Reasonably controlled on amlodipine, metoprolol and hydralazine. Home minoxidil on hold. Polypharmacy suggest difficult to control HTN. No ACEI/ARB due to stage IV chronic kidney disease. 6. CAD status post CABG/elevated troponin: Flat trend to troponin. Recent echo with normal EF. May be due to demand ischemia from acute illness. Cardiology signed off. 7. Stage IV chronic kidney disease: Creatinine stable. May consider outpatient nephrology consultation but unlikely to be a dialysis candidate. 8. Persistent atrial fibrillation: Continue metoprolol and Eliquis. Rate controlled. 9. Type II DM with renal complications: Tradjenta on hold. Levemir dose reduced due to recent hypoglycemia. CBG is mildly uncontrolled and fluctuating. Continue current dose of Levemir 6 units twice a day, NovoLog mealtime and SSI. 10. ? History of gout: Continue allopurinol 11. Hyperlipidemia: Continue Zocor. 12. Abdominal distention/constipation/external hemorrhoid: KUB 6/7 without bowel obstruction. Having BMs. 13. Anemia: Possibly related to chronic kidney disease. She stable. Recent transfusion of 2 units PRBCs during last admission. Also may be due to B12 deficiency and iron deficiency. B12 supplements. Iron supplements pending improvement of constipation. 14. Situational depression: Has felt desponded since daughter passed away and family left on vacation and felt like "ending it all" since past month. Denies suicidal or homicidal ideations. Monitor. 15. NSVT: Seen on telemetry on 6/7 & 6/8. Potassium 3.9 and magnesium 2.4. Could also be A. fib with  aberrancy  but seems less likely. 2-D echo 5/20: shows LVEF 60-65 percent. May consider increasing beta blockers if blood pressure and heart rate tolerate. 16. Hypokalemia: Resolved. 17. Status post bilateral BKA: 18. Adult failure to thrive: Palliative care was consulted for goals of care, consultation notes 08/13/16 appreciated. At this time continued. Off treatment. Consider outpatient palliative care follow-up. 19. Thrombocytopenia:? Related to acute illness. Resolved.   DVT prophylaxis: Eliquis Code Status: DO NOT RESUSCITATE Family Communication: None at bedside Disposition: DC to SNF possibly 6/11.   Consultants:  Cardiology-signed off   Procedures:  BiPAP-now off  Antimicrobials:  IV cefepime 6/4 > IV vancomycin 6/4 > 6/5.   Subjective: Overall feels better compared to admission. Dyspnea improved but breathing is not yet at baseline. Mostly dry cough. No chest pain reported. States that he had BM after enema yesterday.   ROS: No suicidal or homicidal ideations. As per RN, no acute issues reported.  Objective:  Vitals:   08/17/16 0019 08/17/16 0430 08/17/16 0624 08/17/16 1000  BP: 135/71 (!) 146/75  130/80  Pulse: 68 (!) 55    Resp: 20 20    Temp: 98.2 F (36.8 C) 98.1 F (36.7 C)    TempSrc: Oral Oral    SpO2: 97% 96%  95%  Weight:   85.7 kg (189 lb)   Height:        Examination:  General exam: Pleasant elderly male lying comfortably propped up in bed. Respiratory system: Slightly harsh breath sounds bilaterally but no wheezing, rhonchi or crackles heard. Respiratory effort normal. Cardiovascular system: S1 & S2 heard, irregularly irregular. No JVD, murmurs, rubs, gallops or clicks. No pedal edema. Telemetry: A. fib, BBB morphology, controlled ventricular rate, occasional PVCs, occasional bradycardia in the 30s/40s. NSVT noted on telemetry on 6/7 & 6/8. None since. Gastrointestinal system: Abdomen is nondistended, soft and nontender. No organomegaly or masses  felt. Normal bowel sounds heard. Central nervous system: Alert and oriented. No focal neurological deficits. Extremities: Symmetric 5 x 5 power. Skin: No rashes, lesions or ulcers Psychiatry: Judgement and insight appear normal. Mood & affect appropriate.     Data Reviewed: I have personally reviewed following labs and imaging studies  CBC:  Recent Labs Lab 08/11/16 1727  08/12/16 0346 08/13/16 0602 08/14/16 0503 08/15/16 0316 08/16/16 0521  WBC 11.7*  --  6.9 8.4 6.9 6.2 5.4  NEUTROABS 10.0*  --  5.3  --  5.2  --  3.5  HGB 9.7*  < > 8.7* 9.1* 9.2* 9.2* 8.2*  HCT 29.5*  < > 27.4* 28.3* 28.5* 29.2* 25.7*  MCV 92.5  --  91.9 91.0 91.6 93.3 91.1  PLT 139*  --  125* 144* PLATELET CLUMPS NOTED ON SMEAR, UNABLE TO ESTIMATE 159 158  < > = values in this interval not displayed. Basic Metabolic Panel:  Recent Labs Lab 08/13/16 0602 08/14/16 0503 08/14/16 2227 08/15/16 0316 08/16/16 0521  NA 138 140 138 137 135  K 3.4* 4.4 3.8 4.0 3.9  CL 103 106 106 106 105  CO2 20* 20* 22 19* 20*  GLUCOSE 151* 152* 109* 90 161*  BUN 70* 83* 81* 82* 82*  CREATININE 2.60* 2.72* 2.48* 2.36* 2.34*  CALCIUM 9.1 9.3 9.2 8.8* 8.7*  MG  --   --  2.4  --   --    Liver Function Tests:  Recent Labs Lab 08/11/16 1727 08/14/16 0503 08/15/16 0316  AST 21 28 28   ALT 23 31 34  ALKPHOS 89 78 74  BILITOT 1.3*  1.2 0.9  PROT 6.5 6.6 6.0*  ALBUMIN 3.7 3.4* 3.1*   Cardiac Enzymes:  Recent Labs Lab 08/12/16 0346 08/12/16 0839  TROPONINI 0.71*  0.76* 0.76*   CBG:  Recent Labs Lab 08/16/16 1150 08/16/16 1622 08/16/16 2123 08/17/16 0743 08/17/16 1119  GLUCAP 169* 104* 140* 143* 244*    Recent Results (from the past 240 hour(s))  MRSA PCR Screening     Status: None   Collection Time: 08/11/16 10:55 PM  Result Value Ref Range Status   MRSA by PCR NEGATIVE NEGATIVE Final    Comment:        The GeneXpert MRSA Assay (FDA approved for NASAL specimens only), is one component of  a comprehensive MRSA colonization surveillance program. It is not intended to diagnose MRSA infection nor to guide or monitor treatment for MRSA infections.          Radiology Studies: No results found.      Scheduled Meds: . allopurinol  150 mg Oral Daily  . amLODipine  10 mg Oral Daily  . apixaban  2.5 mg Oral BID  . Gerhardt's butt cream   Topical BID  . hydrALAZINE  50 mg Oral Q8H  . hydrocortisone   Rectal BID  . insulin aspart  0-9 Units Subcutaneous TID WC  . insulin aspart  3 Units Subcutaneous TID WC  . insulin detemir  6 Units Subcutaneous Daily   And  . insulin detemir  6 Units Subcutaneous QHS  . lactulose  20 g Oral BID  . metoprolol tartrate  25 mg Oral BID  . sodium chloride flush  3 mL Intravenous Q12H   Continuous Infusions: . ceFEPime (MAXIPIME) IV Stopped (08/16/16 1839)     LOS: 6 days     Kyliegh Jester, MD, FACP, FHM. [facility] Pager [phone]  If 7PM-7AM, please contact night-coverage [email or web address] Password [email or web address] 08/17/2016, 12:00 PM

## 2016-08-17 NOTE — Progress Notes (Signed)
Oxygen sat 95% @oxygen  1litre via [place]

## 2016-08-17 NOTE — Progress Notes (Signed)
Pt's oxygen sat is 96% on RA, pt is doing fine, not having SOB and distress, will continue to monitor

## 2016-08-18 DIAGNOSIS — J9601 Acute respiratory failure with hypoxia: Secondary | ICD-10-CM

## 2016-08-18 LAB — GLUCOSE, CAPILLARY
GLUCOSE-CAPILLARY: 197 mg/dL — AB (ref 65–99)
Glucose-Capillary: 174 mg/dL — ABNORMAL HIGH (ref 65–99)

## 2016-08-18 LAB — BASIC METABOLIC PANEL
ANION GAP: 9 (ref 5–15)
BUN: 75 mg/dL — ABNORMAL HIGH (ref 6–20)
BUN: 75 — AB (ref 4–21)
CO2: 20 mmol/L — ABNORMAL LOW (ref 22–32)
CREATININE: 2.1 — AB (ref 0.6–1.3)
Calcium: 8.8 mg/dL — ABNORMAL LOW (ref 8.9–10.3)
Chloride: 105 mmol/L (ref 101–111)
Creatinine, Ser: 2.12 mg/dL — ABNORMAL HIGH (ref 0.61–1.24)
GFR, EST AFRICAN AMERICAN: 31 mL/min — AB (ref >60)
GFR, EST NON AFRICAN AMERICAN: 27 mL/min — AB (ref >60)
GLUCOSE: 206 mg/dL — AB (ref 65–99)
Glucose: 206
Potassium: 3.8 mmol/L (ref 3.5–5.1)
SODIUM: 134 — AB (ref 137–147)
Sodium: 134 mmol/L — ABNORMAL LOW (ref 135–145)

## 2016-08-18 MED ORDER — POLYETHYLENE GLYCOL 3350 17 G PO PACK: 17.0000 g | PACK | Freq: Every day | ORAL | Status: DC

## 2016-08-18 MED ORDER — HYDRALAZINE HCL 50 MG PO TABS: 50.0000 mg | ORAL_TABLET | Freq: Three times a day (TID) | ORAL | Status: DC

## 2016-08-18 MED ORDER — SENNOSIDES-DOCUSATE SODIUM 8.6-50 MG PO TABS: 1.0000 | ORAL_TABLET | Freq: Every day | ORAL | Status: AC

## 2016-08-18 MED ORDER — INSULIN ASPART 100 UNIT/ML ~~LOC~~ SOLN: 3.0000 [IU] | Freq: Three times a day (TID) | SUBCUTANEOUS | Status: DC

## 2016-08-18 MED ORDER — INSULIN DETEMIR 100 UNIT/ML ~~LOC~~ SOLN: 8.0000 [IU] | Freq: Two times a day (BID) | SUBCUTANEOUS | Status: DC

## 2016-08-18 MED ORDER — METOPROLOL TARTRATE 25 MG PO TABS: 25.0000 mg | ORAL_TABLET | Freq: Two times a day (BID) | ORAL | Status: AC

## 2016-08-18 MED ORDER — ALLOPURINOL 300 MG PO TABS: 150.0000 mg | ORAL_TABLET | Freq: Every day | ORAL | Status: AC

## 2016-08-18 MED ORDER — HYDROCORTISONE 2.5 % RE CREA: TOPICAL_CREAM | Freq: Two times a day (BID) | RECTAL | Status: AC | PRN

## 2016-08-18 NOTE — Clinical Social Work Placement (Signed)
   CLINICAL SOCIAL WORK PLACEMENT  NOTE  Date:  08/18/2016  Patient Details  Name: Tony Mcclure MRN: [id number] Date of Birth: 1928-05-02  Clinical Social Work is seeking post-discharge placement for this patient at the Skilled  Nursing Facility level of care (*CSW will initial, date and re-position this form in  chart as items are completed):  Yes   Patient/family provided with [facility] Clinical Social Work Department's list of facilities offering this level of care within the geographic area requested by the patient (or if unable, by the patient's family).  Yes   Patient/family informed of their freedom to choose among providers that offer the needed level of care, that participate in Medicare, Medicaid or managed care program needed by the patient, have an available bed and are willing to accept the patient.  Yes   Patient/family informed of [facility]'s ownership interest in [facility] and [facility], as well as of the fact that they are under no obligation to receive care at these facilities.  PASRR submitted to EDS on 08/18/16     PASRR number received on 08/18/16     Existing PASRR number confirmed on       FL2 transmitted to all facilities in geographic area requested by pt/family on 08/18/16     FL2 transmitted to all facilities within larger geographic area on       Patient informed that his/her managed care company has contracts with or will negotiate with certain facilities, including the following:        Yes   Patient/family informed of bed offers received.  Patient chooses bed at [facility]     Physician recommends and patient chooses bed at      Patient to be transferred to [facility] on 08/18/16.  Patient to be transferred to facility by [facility]     Patient family notified on 08/18/16 of transfer.  Name of family member notified:  Terri Nimmo     PHYSICIAN Please prepare prescriptions, Please sign FL2      Additional Comment:    _______________________________________________ Margarito LinerSarah C Deira Shimer, LCSW 08/18/2016, 11:22 AM

## 2016-08-18 NOTE — Clinical Social Work Note (Addendum)
Patient does not know his social security number but states that his daughter might know it. CSW left voicemail for patient's daughter. CSW provided patient with bed offers so far. Patient cannot go to SNF until PASARR obtained. CSW cannot start screening for PASARR until social security number obtained.  Charlynn CourtSarah Amadi Yoshino, CSW [phone]  11:19 am Patient now more awake and can provide his social security number: [id number]. PASARR obtained: [phone]. CSW spoke with patient's daughter and she has chosen [facility]. Patient is agreeable to this. CSW notified admissions coordinator and they can take him today. CSW paged MD to make him aware.  Charlynn CourtSarah Lavel Rieman, CSW [phone]

## 2016-08-18 NOTE — Progress Notes (Signed)
Inpatient Diabetes Program Recommendations  AACE/ADA: New Consensus Statement on Inpatient Glycemic Control (2015)  Target Ranges:  Prepandial:   less than 140 mg/dL      Peak postprandial:   less than 180 mg/dL (1-2 hours)      Critically ill patients:  140 - 180 mg/dL   Lab Results  Component Value Date   GLUCAP 174 (H) 08/18/2016   HGBA1C 7.4 (H) 08/12/2016    Review of Glycemic Control:  Results for Tony Mcclure, Tony (MRN [id number]) as of 08/18/2016 08:32  Ref. Range 08/17/2016 07:43 08/17/2016 11:19 08/17/2016 16:26 08/17/2016 21:57 08/18/2016 07:41  Glucose-Capillary Latest Ref Range: 65 - 99 mg/dL 409143 (H) 811244 (H) 914290 (H) 210 (H) 174 (H)   Diabetes history: Type 2 diabetes Outpatient Diabetes medications: Levemir 14 units q AM and 12 units q PM, Novolog 8 units bid, Tradjenta 5 mg daily Current orders for Inpatient glycemic control:  Novolog sensitive tid with meals, Novolog 3 units tid with meals, Levemir 6 units q AM and 6 units q PM  Inpatient Diabetes Program Recommendations:   Consider increasing Levemir to 8 units bid.    Thanks, Beryl MeagerJenny Carley Glendenning, RN, BC-ADM Inpatient Diabetes Coordinator Pager [phone] (8a-5p)

## 2016-08-18 NOTE — Progress Notes (Signed)
Report called to [facility]. Pt waiting on transport.

## 2016-08-18 NOTE — Plan of Care (Signed)
Problem: Safety: Goal: Ability to remain free from injury will improve Outcome: Progressing PT using call bell to call for assistance.

## 2016-08-18 NOTE — Clinical Social Work Note (Signed)
Clinical Social Work Assessment  Patient Details  Name: Tony Mcclure MRN: [id number] Date of Birth: [birth year]  Date of referral:  08/18/16               Reason for consult:  Facility Placement, Discharge Planning                Permission sought to share information with:  Chartered certified accountant granted to share information::  Yes, Verbal Permission Granted  Name::        Agency::  SNF's  Relationship::     Contact Information:     Housing/Transportation Living arrangements for the past 2 months:  Single Family Home Source of Information:  Patient, Medical Team Patient Interpreter Needed:  None Criminal Activity/Legal Involvement Pertinent to Current Situation/Hospitalization:  No - Comment as needed Significant Relationships:  Adult Children, Other Family Members Lives with:  Adult Children, Relatives Do you feel safe going back to the place where you live?  Yes Need for family participation in patient care:  Yes (Comment)  Care giving concerns:  PT recommending SNF once medically stable for discharge.   Social Worker assessment / plan:  CSW met with patient. No supports at bedside. CSW explained that PT recommendations would be discussed. Patient agreeable to SNF placement. He states he has no preference as he is not familiar with the area but is hopeful that his daughter is familiar with some of the facilities. CSW provided SNF list for review. Per MD, patient stable for discharge today. CSW will need to obtain SS# from patient for PASARR. No further concerns. CSW encouraged patient to contact CSW as needed. CSW will continue to follow patient for support and facilitate discharge to SNF today.  Employment status:  Retired Nurse, adult PT Recommendations:  [place] / Referral to community resources:  [place]  Patient/Family's Response to care:  Patient agreeable to SNF placement.  Patient's daughter and grandson supportive and involved in patient's care. Patient appreciated social work intervention.  Patient/Family's Understanding of and Emotional Response to Diagnosis, Current Treatment, and Prognosis:  Patient has a good understanding of the reason for admission and his need for rehab prior to returning home. Patient appears happy with hospital care.  Emotional Assessment Appearance:  Appears stated age Attitude/Demeanor/Rapport:  Other (Pleasant) Affect (typically observed):  Accepting, Appropriate, Calm, Pleasant Orientation:  Oriented to Self, Oriented to Place, Oriented to  Time, Oriented to Situation Alcohol / Substance use:  Never Used Psych involvement (Current and /or in the community):  No (Comment)  Discharge Needs  Concerns to be addressed:  Care Coordination Readmission within the last 30 days:  Yes Current discharge risk:  Dependent with Mobility, Other (Alone at home during the day) Barriers to Discharge:  Other (Need SS# for PASARR)   Candie Chroman, LCSW 08/18/2016, 10:04 AM

## 2016-08-18 NOTE — NC FL2 (Signed)
[place] MEDICAID FL2 LEVEL OF CARE SCREENING TOOL     IDENTIFICATION  Patient Name: Tony Mcclure Birthdate: [birth year] Sex: male Admission Date (Current Location): 08/11/2016  [facility] and [place] Number:  Producer, television/film/video and Address:  [facility] [place], [address] [place], [place] [address]      Provider Number: [id number]  Attending Physician Name and Address:  Elease Etienne, MD  Relative Name and Phone Number:       Current Level of Care: Hospital Recommended Level of Care: Skilled Nursing Facility Prior Approval Number:    Date Approved/Denied:   PASRR Number: Need to get SS# from him  Discharge Plan: SNF    Current Diagnoses: Patient Active Problem List   Diagnosis Date Noted  . Palliative care by specialist   . DNR (do not resuscitate)   . Constipation   . Adult failure to thrive   . Acute diastolic heart failure (HCC)   . CKD (chronic kidney disease), stage IV (HCC)   . HCAP (healthcare-associated pneumonia) 08/12/2016  . Anemia of chronic disease 07/28/2016  . Paroxysmal A-fib (HCC) 07/28/2016  . Atrial fibrillation with slow ventricular response (HCC) 07/28/2016  . Normocytic anemia 07/26/2016  . CKD (chronic kidney disease), stage III 07/26/2016  . CAD (coronary artery disease) 07/26/2016  . Type 2 diabetes mellitus (HCC) 07/26/2016  . Essential hypertension 07/26/2016  . S/P bilateral below knee amputation (HCC) 07/26/2016    Orientation RESPIRATION BLADDER Height & Weight     Self, Time, Situation, Place  Normal, Other (Comment) (Has not used Bipap since 6/6: 12/6 at 60%.) Continent, External catheter Weight: 191 lb (86.6 kg) (bed) Height:  5\' 6"  (167.6 cm)  BEHAVIORAL SYMPTOMS/MOOD NEUROLOGICAL BOWEL NUTRITION STATUS   (None)  (None) Continent Diet (Heart healthy/carb modified. Fluid restriction: 1200 mL.)  AMBULATORY STATUS COMMUNICATION OF NEEDS Skin   Limited Assist Verbally Other (Comment) (MASD,  Excoriated.)                       Personal Care Assistance Level of Assistance  Bathing, Feeding, Dressing Bathing Assistance: Limited assistance Feeding assistance: Independent Dressing Assistance: Limited assistance     Functional Limitations Info  Sight, Hearing, Speech Sight Info: Adequate Hearing Info: Adequate Speech Info: Adequate    SPECIAL CARE FACTORS FREQUENCY  PT (By licensed PT), Blood pressure     PT Frequency: 5 x week              Contractures Contractures Info: Not present    Additional Factors Info  Code Status, Allergies Code Status Info: DNR Allergies Info: NKDA           Current Medications (08/18/2016):  This is the current hospital active medication list Current Facility-Administered Medications  Medication Dose Route Frequency Provider Last Rate Last Dose  . acetaminophen (TYLENOL) tablet 650 mg  650 mg Oral Q6H PRN Hongalgi, Anand D, MD      . allopurinol (ZYLOPRIM) tablet 150 mg  150 mg Oral Daily Rhetta Mura, MD   150 mg at 08/17/16 1001  . amLODipine (NORVASC) tablet 10 mg  10 mg Oral Daily Katrinka Blazing, Rondell A, MD   10 mg at 08/17/16 1001  . apixaban (ELIQUIS) tablet 2.5 mg  2.5 mg Oral BID Madelyn Flavors A, MD   2.5 mg at 08/17/16 2226  . ceFEPIme (MAXIPIME) 1 g in dextrose 5 % 50 mL IVPB  1 g Intravenous Q24H Dhungel, Nishant, MD   Stopped  at 08/17/16 1737  . Gerhardt's butt cream   Topical BID Rhetta MuraSamtani, Jai-Gurmukh, MD      . hydrALAZINE (APRESOLINE) injection 5 mg  5 mg Intravenous Q4H PRN Kirby-Graham, Beather ArbourKaren J, NP      . hydrALAZINE (APRESOLINE) tablet 50 mg  50 mg Oral Q8H Samtani, Jai-Gurmukh, MD   50 mg at 08/18/16 0530  . hydrocortisone (ANUSOL-HC) 2.5 % rectal cream   Rectal BID Rhetta MuraSamtani, Jai-Gurmukh, MD      . insulin aspart (novoLOG) injection 0-9 Units  0-9 Units Subcutaneous TID WC Rhetta MuraSamtani, Jai-Gurmukh, MD   2 Units at 08/18/16 [id number]  . insulin aspart (novoLOG) injection 3 Units  3 Units Subcutaneous TID WC Rhetta MuraSamtani,  Jai-Gurmukh, MD   3 Units at 08/18/16 0811  . insulin detemir (LEVEMIR) injection 6 Units  6 Units Subcutaneous Daily Rhetta MuraSamtani, Jai-Gurmukh, MD   6 Units at 08/17/16 1001   And  . insulin detemir (LEVEMIR) injection 6 Units  6 Units Subcutaneous QHS Rhetta MuraSamtani, Jai-Gurmukh, MD   6 Units at 08/17/16 2230  . ipratropium-albuterol (DUONEB) 0.5-2.5 (3) MG/3ML nebulizer solution 3 mL  3 mL Nebulization Q4H PRN Madelyn FlavorsSmith, Rondell A, MD   3 mL at 08/14/16 1235  . lactulose (CHRONULAC) 10 GM/15ML solution 20 g  20 g Oral BID Rhetta MuraSamtani, Jai-Gurmukh, MD   20 g at 08/17/16 2227  . metoprolol tartrate (LOPRESSOR) tablet 25 mg  25 mg Oral BID Dhungel, Nishant, MD   25 mg at 08/17/16 2226  . ondansetron (ZOFRAN) injection 4 mg  4 mg Intravenous Q6H PRN Madelyn FlavorsSmith, Rondell A, MD      . senna-docusate (Senokot-S) tablet 2 tablet  2 tablet Oral QHS PRN Canary BrimLarach, Mary W, NP   2 tablet at 08/14/16 2106  . simethicone (MYLICON) chewable tablet 80 mg  80 mg Oral QID PRN Smith, Rondell A, MD      . sodium chloride flush (NS) 0.9 % injection 3 mL  3 mL Intravenous Q12H Smith, Rondell A, MD   3 mL at 08/17/16 2227  . sodium chloride flush (NS) 0.9 % injection 3 mL  3 mL Intravenous PRN Clydie BraunSmith, Rondell A, MD         Discharge Medications: Please see discharge summary for a list of discharge medications.  Relevant Imaging Results:  Relevant Lab Results:   Additional Information Need to get SS# from patient  Margarito LinerSarah C Babbie Dondlinger, LCSW

## 2016-08-18 NOTE — Clinical Social Work Note (Signed)
CSW facilitated patient discharge including contacting patient family and facility to confirm patient discharge plans. Clinical information faxed to facility and family agreeable with plan. CSW arranged ambulance transport via [facility] to [facility]. RN to call report prior to discharge ([phone]).  CSW will sign off for now as social work intervention is no longer needed. Please consult us again if new needs arise.  Delainie Chavana, CSW [phone]  

## 2016-08-18 NOTE — Discharge Summary (Signed)
Physician Discharge Summary  Tony Mcclure ZOX:[id number] DOB: January 03, 1929  PCP: [name], MD  Admit date: 08/11/2016 Discharge date: 08/18/2016  Recommendations for Outpatient Follow-up:  1. M.D. at SNF in 3 days with repeat labs (CBC & BMP). 2. Recommend follow-up chest x-ray in 4 weeks. 3. Consider palliative care consultation and follow-up at SNF for ongoing goals of care discussion given advanced age, multiple severe significant comorbidities and failure to thrive. 4. Dr. Virl Son, PCP, upon discharge from SNF. 5. Dr. Verdis Prime, Cardiology on 09/23/16 at 9:40 AM.   Home Health: None Equipment/Devices: None    Discharge Condition: Improved and stable  CODE STATUS: DO NOT RESUSCITATE  Diet recommendation: Heart healthy & diabetic diet.  Discharge Diagnoses:  Principal Problem:   HCAP (healthcare-associated pneumonia) Active Problems:   Normocytic anemia   Type 2 diabetes mellitus (HCC)   Essential hypertension   S/P bilateral below knee amputation (HCC)   Paroxysmal A-fib (HCC)   Acute diastolic heart failure (HCC)   CKD (chronic kidney disease), stage IV (HCC)   Palliative care by specialist   DNR (do not resuscitate)   Constipation   Adult failure to thrive   Brief Summary: 83 year old male, lives at home, moves around with the help of a wheelchair, not on home oxygen, PMH of HTN, CAD, DM 2, stage IV chronic kidney disease, A. fib on Eliquis, b/l BKA, recent hospitalization 5/19-5/21 for new onset A. fib, symptomatic anemia (transfused 2 units PRBCs), since discharge not completely recovered, treated for bronchitis with doxycycline, presented with progressive dyspnea, mostly nonproductive cough and abdominal distention and pain. Hypoxic by EMS at 86% on room air. Required BiPAP in ED. Febrile 101.4, tachycardic, tachypnea, hypertensive and hypoxic in ED. Chest x-ray suggested pulmonary edema, possible left middle lobe pneumonia and sepsis protocol  initiated.   Assessment & Plan:   1. Possible Healthcare associated pneumonia, left midlung: Urine pneumococcal and Legionella antigen negative. MRSA PCR negative. Unfortunately no cultures were sent on admission and patient had mild leukocytosis which has resolved. Defervesced. Vancomycin was discontinued after 2 doses. Completed 7 days course of IV cefepime. Pro calcitonin 0.71. Repeat chest x-ray 6/10 without obvious consolidation. Clinically improved. Recommend follow-up chest x-ray in 4 weeks.  2. Sepsis, present on admission:  possibly related to pneumonia. Sepsis physiology resolved. 3. Acute respiratory failure with hypoxia: Secondary to decompensated CHF and pneumonia. Not on home oxygen prior to admission. Improving. Required BiPAP in the ED, now off. Hypoxia resolved and saturating 94-100 percent on room air. 4. Acute diastolic CHF: Briefly treated with diuretic therapy which was discontinued due to worsening renal functions. Euvolemic at this time. Closely follow up to consider need for Lasix when necessary. 5. Essential hypertension: Reasonably controlled on amlodipine, metoprolol (dose increased) and hydralazine (dose increased). Minoxidil discontinued. Polypharmacy suggest difficult to control HTN. No ACEI/ARB due to stage IV chronic kidney disease. 6. CAD status post CABG/elevated troponin: Flat trend to troponin. Recent echo with normal EF. May be due to demand ischemia from acute illness. Cardiology signed off. 7. Stage IV chronic kidney disease: Creatinine stable. May consider outpatient nephrology consultation but unlikely to be a dialysis candidate. No creatinine in [facility] prior to 07/26/16. Follow-up with PCP who may have prior lab results to compare. Creatinine has progressively decreased from peak of 2.72 on 6/7 to 2.12. 8. Persistent atrial fibrillation: Continue metoprolol and Eliquis. Rate controlled. 9. Type II DM with renal complications:  due to recent hypoglycemia, reduced  Levemir and mealtime NovoLog. Tradjenta  discontinued. Continue to monitor CBGs 3 times a day before meals and at bedtime. Mildly uncontrolled and fluctuating in the hospital. Further adjustment of insulins at SNF as deemed necessary. 10. ? History of gout: Continue allopurinol at renally adjusted dose. 11. Hyperlipidemia: Continue Zocor. 12. Abdominal distention/constipation/external hemorrhoid: KUB 6/7 without bowel obstruction. Having BMs. Continue bowel regimen and adjust as needed. 13. Anemia: Possibly related to chronic kidney disease or chronic disease. Stable. Recent transfusion of 2 units PRBCs during last admission. Also may be due to B12 deficiency (285 on 5/19) and iron deficiency (ferritin 222 on 5/19). Consider B12 and iron supplements. 14. Situational depression: Has felt desponded since daughter passed away and family left on vacation and felt like "ending it all" since past month. Denies suicidal or homicidal ideations. Monitor. Stable. 15. NSVT: Seen on telemetry on 6/7 & 6/8. Potassium 3.8 and magnesium 2.4. Could also be A. fib with aberrancy but seems less likely. 2-D echo 5/20: shows LVEF 60-65 percent. May consider increasing beta blockers if blood pressure and heart rate tolerate. No further episodes in the last 48 hours. 16. Hypokalemia: Resolved. 17. Status post bilateral BKA: Uses prosthesis to ambulate. 18. Adult failure to thrive: Palliative care was consulted for goals of care, consultation notes 08/13/16 appreciated. At this time continue full scope of treatment. Consider outpatient palliative care follow-up. 19. Thrombocytopenia:? Related to acute illness. Resolved.    Consultants:  Cardiology  Procedures:  BiPAP-now off   Discharge Instructions  Discharge Instructions    (HEART FAILURE PATIENTS) Call MD:  Anytime you have any of the following symptoms: 1) 3 pound weight gain in 24 hours or 5 pounds in 1 week 2) shortness of breath, with or without a dry  hacking cough 3) swelling in the hands, feet or stomach 4) if you have to sleep on extra pillows at night in order to breathe.    Complete by:  As directed    Call MD for:  difficulty breathing, headache or visual disturbances    Complete by:  As directed    Call MD for:  extreme fatigue    Complete by:  As directed    Call MD for:  persistant dizziness or light-headedness    Complete by:  As directed    Call MD for:  persistant nausea and vomiting    Complete by:  As directed    Call MD for:  severe uncontrolled pain    Complete by:  As directed    Call MD for:  temperature >100.4    Complete by:  As directed    Diet - low sodium heart healthy    Complete by:  As directed    Diet Carb Modified    Complete by:  As directed    Increase activity slowly    Complete by:  As directed        Medication List    STOP taking these medications   doxycycline 100 MG EC tablet Commonly known as:  DORYX   linagliptin 5 MG Tabs tablet Commonly known as:  TRADJENTA   minoxidil 2.5 MG tablet Commonly known as:  LONITEN     TAKE these medications   allopurinol 300 MG tablet Commonly known as:  ZYLOPRIM Take 0.5 tablets (150 mg total) by mouth daily. What changed:  how much to take   amLODipine 10 MG tablet Commonly known as:  NORVASC Take 10 mg by mouth daily.   apixaban 2.5 MG Tabs tablet Commonly known as:  ELIQUIS  Take 1 tablet (2.5 mg total) by mouth 2 (two) times daily.   hydrALAZINE 50 MG tablet Commonly known as:  APRESOLINE Take 1 tablet (50 mg total) by mouth 3 (three) times daily. What changed:  medication strength  how much to take  when to take this   hydrocortisone 2.5 % rectal cream Commonly known as:  ANUSOL-HC Place rectally 2 (two) times daily as needed for hemorrhoids or itching.   insulin aspart 100 UNIT/ML injection Commonly known as:  novoLOG Inject 3 Units into the skin 3 (three) times daily with meals. Provided he eats >50% at each meal. What  changed:  how much to take  when to take this  additional instructions   insulin detemir 100 UNIT/ML injection Commonly known as:  LEVEMIR Inject 0.08 mLs (8 Units total) into the skin 2 (two) times daily. What changed:  how much to take  additional instructions   metoprolol tartrate 25 MG tablet Commonly known as:  LOPRESSOR Take 1 tablet (25 mg total) by mouth 2 (two) times daily. What changed:  how much to take   polyethylene glycol packet Commonly known as:  MIRALAX / GLYCOLAX Take 17 g by mouth daily. What changed:  when to take this  reasons to take this   senna-docusate 8.6-50 MG tablet Commonly known as:  Senokot-S Take 1 tablet by mouth at bedtime.   simvastatin 40 MG tablet Commonly known as:  ZOCOR Take 40 mg by mouth every evening.       Contact information for follow-up providers    MD at SNF. Schedule an appointment as soon as possible for a visit in 3 day(s).   Why:  To be seen with repeat labs (CBC & BMP).       [name], MD. Schedule an appointment as soon as possible for a visit.   Specialty:  Family Medicine Why:  Upon discharge from SNF. Contact information: [address] [place] [address] [phone]        Lyn Records, MD Follow up on 09/23/2016.   Specialty:  Cardiology Why:  9:40 AM. Contact information: [address] [place] [address] [phone]            Contact information for after-discharge care    Destination    [facility] Follow up.   Specialty:  Skilled Nursing Facility Contact information: [address] [place] [address] [phone]                 No Known Allergies    Procedures/Studies: Dg Chest 2 View  Result Date: 08/17/2016 CLINICAL DATA:  CHF EXAM: CHEST  2 VIEW COMPARISON:  08/11/2016 FINDINGS: Low lung volumes. No focal consolidation or frank interstitial edema. Suspected small bilateral  pleural effusions on the lateral view. Cardiomegaly.  Postsurgical changes related to prior CABG. Median sternotomy. Degenerative changes of the visualized thoracolumbar spine. IMPRESSION: Low lung volumes without frank interstitial edema. Suspected small bilateral pleural effusions. Electronically Signed   By: Charline Bills M.D.   On: 08/17/2016 13:06   Dg Chest Port 1 View  Result Date: 08/11/2016 CLINICAL DATA:  Dyspnea,  tachypnea and expiratory wheeze. EXAM: PORTABLE CHEST 1 VIEW COMPARISON:  07/26/2016 FINDINGS: Status post CABG with median sternotomy sutures. Stable cardiomegaly. Atherosclerosis of the aortic arch without aneurysm. Diffuse interstitial edema with superimposed areas of bilateral airspace opacity raise concern for CHF. Superimposed pneumonia is not entirely excluded given smaller focus of  confluent airspace opacity in the left mid lung. There may be a new loop reporting type device projecting over the left heart border. IMPRESSION: 1. Cardiomegaly with post CABG change. 2. Aortic atherosclerosis. 3. Mild interstitial pulmonary edema. Superimposed pneumonia especially in the left mid lung is not entirely excluded though more likely related to CHF. Electronically Signed   By: Tollie Eth M.D.   On: 08/11/2016 18:01   Dg Abd Portable 1v  Result Date: 08/14/2016 CLINICAL DATA:  Constipation. EXAM: PORTABLE ABDOMEN - 1 VIEW COMPARISON:  08/11/2016.  07/26/2016. FINDINGS: AP portable supine view the abdomen shows diffuse gaseous distention of large and small bowel, similar to slightly progressed in the interval. This is similar and perhaps slightly progressed in terms of colonic gas when comparing to 07/26/2016 as well. Degenerative changes noted lumbar spine. IMPRESSION: Diffuse gaseous distention of large and small bowel with slightly more gaseous colonic distention on today's exam than on the prior studies. Electronically Signed   By: Kennith Center M.D.   On: 08/14/2016 16:07   Dg Abd  Portable 1 View  Result Date: 08/11/2016 CLINICAL DATA:  Abdominal distention. Last bowel movement 4 days ago. EXAM: PORTABLE ABDOMEN - 1 VIEW COMPARISON:  07/26/2016 FINDINGS: The periphery of the right hemiabdomen is excluded on current exam due to body habitus. No significant retention of stool. Moderate gaseous distention of large bowel without definite source of mechanical obstruction. Superimposed gas containing small bowel loops are noted. No conclusive evidence for bowel obstruction. There is no free air, organomegaly nor suspicious calculi. There is lumbar spondylitic change with multilevel disc space narrowing and osteophyte formation as before. IMPRESSION: Nonobstructive gas pattern.  Lumbar spondylosis. Electronically Signed   By: Tollie Eth M.D.   On: 08/11/2016 17:58      Subjective: Seen this morning. Denies dyspnea. States that his breathing is back to usual. No chest pain reported. Reports chronic nonproductive intermittent cough ongoing for the last 6 months. Denies coughing while eating or drinking or choking sensation. As per RN, no acute issues reported.  Discharge Exam:  Vitals:   08/17/16 2224 08/18/16 0515 08/18/16 1028 08/18/16 1222  BP: (!) 151/51 (!) 159/49  (!) 140/57  Pulse: 78 (!) 50 74 61  Resp:    18  Temp: 97.6 F (36.4 C) 98.1 F (36.7 C)  98 F (36.7 C)  TempSrc: Oral Oral  Oral  SpO2: 94% 94%  100%  Weight:  86.6 kg (191 lb)    Height:        General exam: Pleasant elderly male lying sitting up comfortably in bed Respiratory system: Clear to auscultation without wheezing, rhonchi or crackles. Respiratory effort normal. Cardiovascular system: S1 & S2 heard, irregularly irregular. No JVD, murmurs, rubs, gallops or clicks. Telemetry: A. fib with controlled ventricular rate. Occasional bradycardia in the 40s-50s, appears mostly at night and probably while asleep and asymptomatic. Gastrointestinal system: Abdomen is nondistended, soft and nontender. No  organomegaly or masses felt. Normal bowel sounds heard. Condom catheter +. Central nervous system: Alert and oriented. No focal neurological deficits. Extremities: Symmetric 5 x 5 power. Bilateral BKA healed stumps without acute issues. Skin: No rashes, lesions or ulcers Psychiatry: Judgement and insight appear normal. Mood & affect appropriate.    The results of significant diagnostics from this hospitalization (including imaging, microbiology, ancillary and laboratory) are listed below for reference.     Microbiology: Recent Results (from the past 240 hour(s))  MRSA PCR Screening     Status: None  Collection Time: 08/11/16 10:55 PM  Result Value Ref Range Status   MRSA by PCR NEGATIVE NEGATIVE Final    Comment:        The GeneXpert MRSA Assay (FDA approved for NASAL specimens only), is one component of a comprehensive MRSA colonization surveillance program. It is not intended to diagnose MRSA infection nor to guide or monitor treatment for MRSA infections.      Labs: CBC:  Recent Labs Lab 08/11/16 1727  08/12/16 0346 08/13/16 0602 08/14/16 0503 08/15/16 0316 08/16/16 0521 08/17/16 1214  WBC 11.7*  --  6.9 8.4 6.9 6.2 5.4 5.7  NEUTROABS 10.0*  --  5.3  --  5.2  --  3.5  --   HGB 9.7*  < > 8.7* 9.1* 9.2* 9.2* 8.2* 8.6*  HCT 29.5*  < > 27.4* 28.3* 28.5* 29.2* 25.7* 26.6*  MCV 92.5  --  91.9 91.0 91.6 93.3 91.1 92.4  PLT 139*  --  125* 144* PLATELET CLUMPS NOTED ON SMEAR, UNABLE TO ESTIMATE 159 158 166  < > = values in this interval not displayed. Basic Metabolic Panel:  Recent Labs Lab 08/14/16 2227 08/15/16 0316 08/16/16 0521 08/17/16 1214 08/18/16 0224  NA 138 137 135 133* 134*  K 3.8 4.0 3.9 4.0 3.8  CL 106 106 105 105 105  CO2 22 19* 20* 19* 20*  GLUCOSE 109* 90 161* 219* 206*  BUN 81* 82* 82* 76* 75*  CREATININE 2.48* 2.36* 2.34* 2.09* 2.12*  CALCIUM 9.2 8.8* 8.7* 8.8* 8.8*  MG 2.4  --   --   --   --    Liver Function Tests:  Recent Labs Lab  08/11/16 1727 08/14/16 0503 08/15/16 0316  AST 21 28 28   ALT 23 31 34  ALKPHOS 89 78 74  BILITOT 1.3* 1.2 0.9  PROT 6.5 6.6 6.0*  ALBUMIN 3.7 3.4* 3.1*   BNP (last 3 results)  Recent Labs  08/11/16 1727  BNP 907.9*   Cardiac Enzymes:  Recent Labs Lab 08/12/16 0346 08/12/16 0839  TROPONINI 0.71*  0.76* 0.76*   CBG:  Recent Labs Lab 08/17/16 1119 08/17/16 1626 08/17/16 2157 08/18/16 0741 08/18/16 1118  GLUCAP 244* 290* 210* 174* 197*   Urinalysis    Component Value Date/Time   COLORURINE YELLOW 08/12/2016 0617   APPEARANCEUR CLOUDY (A) 08/12/2016 0617   LABSPEC 1.011 08/12/2016 0617   PHURINE 6.0 08/12/2016 0617   GLUCOSEU NEGATIVE 08/12/2016 0617   HGBUR SMALL (A) 08/12/2016 0617   BILIRUBINUR NEGATIVE 08/12/2016 0617   KETONESUR NEGATIVE 08/12/2016 0617   PROTEINUR 100 (A) 08/12/2016 0617   NITRITE NEGATIVE 08/12/2016 0617   LEUKOCYTESUR NEGATIVE 08/12/2016 0617      Time coordinating discharge: Over 30 minutes  SIGNED:  Marcellus Scott, MD, FACP, FHM. [facility] Pager [phone]  If 7PM-7AM, please contact night-coverage [email or web address] Password [facility] 08/18/2016, 12:35 PM

## 2016-08-18 NOTE — Discharge Instructions (Signed)

## 2016-08-19 ENCOUNTER — Encounter: Payer: Self-pay | Admitting: Internal Medicine

## 2016-08-19 ENCOUNTER — Non-Acute Institutional Stay (SKILLED_NURSING_FACILITY): Payer: Medicare Other | Admitting: Internal Medicine

## 2016-08-19 DIAGNOSIS — J9601 Acute respiratory failure with hypoxia: Secondary | ICD-10-CM

## 2016-08-19 DIAGNOSIS — E1122 Type 2 diabetes mellitus with diabetic chronic kidney disease: Secondary | ICD-10-CM | POA: Diagnosis not present

## 2016-08-19 DIAGNOSIS — N184 Chronic kidney disease, stage 4 (severe): Secondary | ICD-10-CM | POA: Diagnosis not present

## 2016-08-19 DIAGNOSIS — R627 Adult failure to thrive: Secondary | ICD-10-CM | POA: Diagnosis not present

## 2016-08-19 DIAGNOSIS — I10 Essential (primary) hypertension: Secondary | ICD-10-CM

## 2016-08-19 DIAGNOSIS — J189 Pneumonia, unspecified organism: Secondary | ICD-10-CM | POA: Diagnosis not present

## 2016-08-19 DIAGNOSIS — I481 Persistent atrial fibrillation: Secondary | ICD-10-CM

## 2016-08-19 DIAGNOSIS — I472 Ventricular tachycardia: Secondary | ICD-10-CM | POA: Diagnosis not present

## 2016-08-19 DIAGNOSIS — D638 Anemia in other chronic diseases classified elsewhere: Secondary | ICD-10-CM | POA: Diagnosis not present

## 2016-08-19 DIAGNOSIS — N183 Chronic kidney disease, stage 3 unspecified: Secondary | ICD-10-CM

## 2016-08-19 DIAGNOSIS — Z794 Long term (current) use of insulin: Secondary | ICD-10-CM

## 2016-08-19 DIAGNOSIS — F4321 Adjustment disorder with depressed mood: Secondary | ICD-10-CM | POA: Diagnosis not present

## 2016-08-19 DIAGNOSIS — I4819 Other persistent atrial fibrillation: Secondary | ICD-10-CM

## 2016-08-19 DIAGNOSIS — A419 Sepsis, unspecified organism: Secondary | ICD-10-CM | POA: Diagnosis not present

## 2016-08-19 DIAGNOSIS — I5031 Acute diastolic (congestive) heart failure: Secondary | ICD-10-CM

## 2016-08-19 DIAGNOSIS — I4729 Other ventricular tachycardia: Secondary | ICD-10-CM

## 2016-08-19 DIAGNOSIS — E785 Hyperlipidemia, unspecified: Secondary | ICD-10-CM

## 2016-08-19 NOTE — Progress Notes (Signed)
: Provider:  Randon Mcclure. Tony Hollingshead, MD Location:  [facility] Room Number: 109P Place of Service:  SNF [phone])  PCP: Tony Au, MD Patient Care Team: Tony Au, MD as PCP - General (Family Medicine)  Extended Emergency Contact Information Primary Emergency Contact: Tony Mcclure  [place] Home Phone: [phone] Mobile Phone: [phone] Relation: Daughter     Allergies: Patient has no known allergies.  Chief Complaint  Patient presents with  . New Admit To SNF    Admit to Facility    HPI: Patient is 83 y.o. male with HTN, CAD, DM 2, stage IV chronic kidney disease, A. fib on Eliquis, b/l BKA, recent hospitalization 5/19-5/21 for new onset A. fib, symptomatic anemia (transfused 2 units PRBCs), since discharge not completely recovered. PT lives at home, moves around with the help of a wheelchair, not on home oxygen.treated for bronchitis with doxycycline, presented with progressive dyspnea, mostly nonproductive cough and abdominal distention and pain. Hypoxic by EMS at 86% on room air. Required BiPAP in ED. Febrile 101.4, tachycardic, tachypnea, hypertensive and hypoxic in ED. Chest x-ray suggested pulmonary edema, possible left middle lobe pneumonia . Pt was admitted to [facility] from 6/4-11 for sepsis 2/2 HCAP, acute resp failure with hypoxia and acute diastolic CHF. Hospital course was complicated by NSVT. All of the above were resolved. Pt is admitted to [facility] generalized weakness for OT/PT. While at SNF pt will be followed for HTN, tx with norvasc, metoprolol and hydralazine, Atrial fib, tx with metoprolol and eliquis and HLD, tx with zocor.  Past Medical History:  Diagnosis Date  . BPH (benign prostatic hyperplasia)   . CAD (coronary artery disease)   . Diabetes mellitus without complication (HCC)   . Hypertension   . Kidney disease   . S/P bilateral BKA (below knee amputation) [facility])     Past Surgical History:    Procedure Laterality Date  . BELOW KNEE LEG AMPUTATION Bilateral   . CORONARY ARTERY BYPASS GRAFT    . PROSTATE SURGERY    . SHOULDER SURGERY      Allergies as of 08/19/2016   No Known Allergies     Medication List       Accurate as of 08/19/16  8:54 AM. Always use your most recent med list.          allopurinol 300 MG tablet Commonly known as:  ZYLOPRIM Take 0.5 tablets (150 mg total) by mouth daily.   amLODipine 10 MG tablet Commonly known as:  NORVASC Take 10 mg by mouth daily.   apixaban 2.5 MG Tabs tablet Commonly known as:  ELIQUIS Take 1 tablet (2.5 mg total) by mouth 2 (two) times daily.   hydrALAZINE 50 MG tablet Commonly known as:  APRESOLINE Take 1 tablet (50 mg total) by mouth 3 (three) times daily.   hydrocortisone 2.5 % rectal cream Commonly known as:  ANUSOL-HC Place rectally 2 (two) times daily as needed for hemorrhoids or itching.   insulin aspart 100 UNIT/ML injection Commonly known as:  novoLOG Inject 3 Units into the skin 3 (three) times daily with meals. Provided he eats >50% at each meal.   insulin detemir 100 UNIT/ML injection Commonly known as:  LEVEMIR Inject 0.08 mLs (8 Units total) into the skin 2 (two) times daily.   metoprolol tartrate 25 MG tablet Commonly known as:  LOPRESSOR Take 1 tablet (25 mg total) by mouth 2 (two) times daily.   polyethylene glycol packet Commonly known as:  MIRALAX / GLYCOLAX Take 17 g by mouth daily.   senna-docusate 8.6-50 MG tablet Commonly known as:  Senokot-S Take 1 tablet by mouth at bedtime.   simvastatin 40 MG tablet Commonly known as:  ZOCOR Take 40 mg by mouth every evening.       No orders of the defined types were placed in this encounter.   Immunization History  Administered Date(s) Administered  . PPD Test 08/18/2016    Social History  Substance Use Topics  . Smoking status: Former Smoker    Years: 20.00  . Smokeless tobacco: Never Used  . Alcohol use No    Family  history is   Family History  Problem Relation Age of Onset  . Heart attack Father       Review of Systems  DATA OBTAINED: from patient, nurse GENERAL:  no fevers, fatigue, appetite changes SKIN: No itching, or rash EYES: No eye pain, redness, discharge EARS: No earache, tinnitus, change in hearing NOSE: No congestion, drainage or bleeding  MOUTH/THROAT: No mouth or tooth pain, No sore throat RESPIRATORY: No cough, wheezing, SOB CARDIAC: No chest pain, palpitations, lower extremity edema  GI: No abdominal pain, No N/V/D or constipation, No heartburn or reflux  GU: No dysuria, frequency or urgency, or incontinence  MUSCULOSKELETAL: No unrelieved bone/joint pain NEUROLOGIC: No headache, dizziness or focal weakness PSYCHIATRIC: No c/o anxiety or sadness   Vitals:   08/19/16 0824  BP: (!) 128/54  Pulse: (!) 59  Resp: 20  Temp: 98 F (36.7 C)    SpO2 Readings from Last 1 Encounters:  08/18/16 100%   Body mass index is 30.83 kg/m.     Physical Exam  GENERAL APPEARANCE: Alert, conversant,  No acute distress.  SKIN: No diaphoresis rash HEAD: Normocephalic, atraumatic  EYES: Conjunctiva/lids clear. Pupils round, reactive. EOMs intact.  EARS: External exam WNL, canals clear. Hearing grossly normal.  NOSE: No deformity or discharge.  MOUTH/THROAT: Lips w/o lesions  RESPIRATORY: Breathing is even, unlabored. Lung sounds are clear   CARDIOVASCULAR: Heart irreg no murmurs, rubs or gallops. No peripheral edema.   GASTROINTESTINAL: Abdomen is soft, non-tender, not distended w/ normal bowel sounds. GENITOURINARY: Bladder non tender, not distended  MUSCULOSKELETAL: No abnormal joints or musculature NEUROLOGIC:  Cranial nerves 2-12 grossly intact. Moves all extremities  PSYCHIATRIC: Mood and affect appropriate to situation, no behavioral issues  Patient Active Problem List   Diagnosis Date Noted  . Palliative care by specialist   . DNR (do not resuscitate)   . Constipation    . Adult failure to thrive   . Acute diastolic heart failure (HCC)   . CKD (chronic kidney disease), stage IV (HCC)   . HCAP (healthcare-associated pneumonia) 08/12/2016  . Anemia of chronic disease 07/28/2016  . Paroxysmal A-fib (HCC) 07/28/2016  . Atrial fibrillation with slow ventricular response (HCC) 07/28/2016  . Normocytic anemia 07/26/2016  . CKD (chronic kidney disease), stage III 07/26/2016  . CAD (coronary artery disease) 07/26/2016  . Type 2 diabetes mellitus (HCC) 07/26/2016  . Essential hypertension 07/26/2016  . S/P bilateral below knee amputation (HCC) 07/26/2016      Labs reviewed: Basic Metabolic Panel:    Component Value Date/Time   NA 134 (L) 08/18/2016 0224   NA 134 (A) 08/18/2016   K 3.8 08/18/2016 0224   CL 105 08/18/2016 0224   CO2 20 (L) 08/18/2016 0224   GLUCOSE 206 (H) 08/18/2016 0224   BUN 75 (H) 08/18/2016 0224   BUN 75 (A) 08/18/2016  CREATININE 2.12 (H) 08/18/2016 0224   CALCIUM 8.8 (L) 08/18/2016 0224   PROT 6.0 (L) 08/15/2016 0316   ALBUMIN 3.1 (L) 08/15/2016 0316   AST 28 08/15/2016 0316   ALT 34 08/15/2016 0316   ALKPHOS 74 08/15/2016 0316   BILITOT 0.9 08/15/2016 0316   GFRNONAA 27 (L) 08/18/2016 0224   GFRAA 31 (L) 08/18/2016 0224     Recent Labs  07/27/16 0921  08/14/16 2227  08/16/16 0521 08/17/16 08/17/16 1214 08/18/16 08/18/16 0224  NA 133*  < > 138  < > 135 133* 133* 134* 134*  K 4.0  < > 3.8  < > 3.9 4.0 4.0  --  3.8  CL 107  < > 106  < > 105  --  105  --  105  CO2 20*  < > 22  < > 20*  --  19*  --  20*  GLUCOSE 184*  < > 109*  < > 161*  --  219*  --  206*  BUN 74*  < > 81*  < > 82* 76* 76* 75* 75*  CREATININE 2.32*  < > 2.48*  < > 2.34* 2.1* 2.09* 2.1* 2.12*  CALCIUM 8.5*  < > 9.2  < > 8.7*  --  8.8*  --  8.8*  MG 2.2  --  2.4  --   --   --   --   --   --   < > = values in this interval not displayed. Liver Function Tests:  Recent Labs  08/11/16 1727 08/14/16 08/14/16 0503 08/15/16 0316  AST 21  21 28 28  28   ALT 23  23 31 31  34  ALKPHOS 89  89 78 78 74  BILITOT 1.3*  --  1.2 0.9  PROT 6.5  --  6.6 6.0*  ALBUMIN 3.7  --  3.4* 3.1*    Recent Labs  08/11/16 1727  LIPASE 17   No results for input(s): AMMONIA in the last 8760 hours. CBC:  Recent Labs  08/12/16 0346  08/14/16 0503  08/15/16 0316 08/16/16 08/16/16 0521 08/17/16 08/17/16 1214  WBC 6.9  < > 6.9  < > 6.2 5.4 5.4 5.7 5.7  NEUTROABS 5.3  --  5.2  --   --   --  3.5  --   --   HGB 8.7*  < > 9.2*  < > 9.2* 8.2* 8.2*  --  8.6*  HCT 27.4*  < > 28.5*  < > 29.2* 26* 25.7*  --  26.6*  MCV 91.9  < > 91.6  --  93.3  --  91.1  --  92.4  PLT 125*  < > PLATELET CLUMPS NOTED ON SMEAR, UNABLE TO ESTIMATE  < > 159 158 158  --  166  < > = values in this interval not displayed. Lipid No results for input(s): CHOL, HDL, LDLCALC, TRIG in the last 8760 hours.  Cardiac Enzymes:  Recent Labs  07/27/16 0921  07/28/16 0143 08/12/16 0346 08/12/16 0839  CKTOTAL 60  --   --   --   --   CKMB 2.7  --   --   --   --   TROPONINI 0.39*  < > 0.36* 0.71*  0.76* 0.76*  < > = values in this interval not displayed. BNP:  Recent Labs  08/11/16 1727  BNP 907.9*   No results found for: [facility] Lab Results  Component Value Date   HGBA1C 7.4 (H) 08/12/2016  Lab Results  Component Value Date   TSH 2.957 07/26/2016   Lab Results  Component Value Date   VITAMINB12 285 07/26/2016   Lab Results  Component Value Date   FOLATE 21.5 07/26/2016   Lab Results  Component Value Date   IRON 24 (L) 07/26/2016   TIBC 262 07/26/2016   FERRITIN 222 07/26/2016    Imaging and Procedures obtained prior to SNF admission: Dg Chest Port 1 View  Result Date: 08/11/2016 CLINICAL DATA:  Dyspnea,  tachypnea and expiratory wheeze. EXAM: PORTABLE CHEST 1 VIEW COMPARISON:  07/26/2016 FINDINGS: Status post CABG with median sternotomy sutures. Stable cardiomegaly. Atherosclerosis of the aortic arch without aneurysm. Diffuse interstitial edema with  superimposed areas of bilateral airspace opacity raise concern for CHF. Superimposed pneumonia is not entirely excluded given smaller focus of confluent airspace opacity in the left mid lung. There may be a new loop reporting type device projecting over the left heart border. IMPRESSION: 1. Cardiomegaly with post CABG change. 2. Aortic atherosclerosis. 3. Mild interstitial pulmonary edema. Superimposed pneumonia especially in the left mid lung is not entirely excluded though more likely related to CHF. Electronically Signed   By: Tollie Eth M.D.   On: 08/11/2016 18:01   Dg Abd Portable 1 View  Result Date: 08/11/2016 CLINICAL DATA:  Abdominal distention. Last bowel movement 4 days ago. EXAM: PORTABLE ABDOMEN - 1 VIEW COMPARISON:  07/26/2016 FINDINGS: The periphery of the right hemiabdomen is excluded on current exam due to body habitus. No significant retention of stool. Moderate gaseous distention of large bowel without definite source of mechanical obstruction. Superimposed gas containing small bowel loops are noted. No conclusive evidence for bowel obstruction. There is no free air, organomegaly nor suspicious calculi. There is lumbar spondylitic change with multilevel disc space narrowing and osteophyte formation as before. IMPRESSION: Nonobstructive gas pattern.  Lumbar spondylosis. Electronically Signed   By: Tollie Eth M.D.   On: 08/11/2016 17:58     Not all labs, radiology exams or other studies done during hospitalization come through on my EPIC note; however they are reviewed by me.    Assessment and Plan  SEPSIS/ HCAP, LML-Urine pneumococcal and Legionella antigen negative. MRSA PCR negative. Unfortunately no cultures were sent on admission and patient had mild leukocytosis which has resolved. Defervesced. Vancomycin was discontinued after 2 doses. Completed 7 days course of IV cefepime. Pro calcitonin 0.71. Repeat chest x-ray 6/10 without obvious consolidation. Clinically improved  ACUTE  RESP FAILURE WITH HYPOXIA/ACUTE DIASTOLIC CHF-Secondary to decompensated CHF and pneumonia. Not on home oxygen prior to admission. Improving. Required BiPAP in the ED, now off. Hypoxia resolved and saturating 94-100 percent on room air; CHF briefly treated with diuretic therapy which was discontinued due to worsening renal functions. Euvolemic at d/c SNF - will monitor weights, edema and BMP for renal fx; pt not d/c on lasix but may need it  STAGE 4 CKD -Creatinine stable. May consider outpatient nephrology consultation but unlikely to be a dialysis candidate. No creatinine in CHL prior to 07/26/16. Follow-up with PCP who may have prior lab results to compare. Creatinine has progressively decreased from peak of 2.72 on 6/7 to 2.12. SNF - will f/u BMP  HTN SNF - well controlled at this time ;cont norvasc 10 mg daily, hydralazine 50 mg TID and metoprolol 25 mg BID  PERSISTENT AF SNF - rate controlled;cont metoprolol 25 mg zBID and eliquis 2.5 mg BID  DM2 WITH COMPLICATIONS- due to recent hypoglycemia, reduced Levemir and mealtime NovoLog. Tradjentadiscontinued.  Continue to monitor CBGs 3 times a day before meals and at bedtime. Mildly uncontrolled and fluctuating in the hospital SNF - will continue 3 u novolog with meals and Levemir 8 u BID  ANEMIA-Possibly related to chronic kidney disease or chronic disease. Stable. Recent transfusion of 2 units PRBCs during last admission. Also may be due to B12 deficiency (285 on 5/19) and irondeficiency (ferritin 222 on 5/19).  SNF - will start FeSo4 325 mg daily; will f/u CBC  DEPRESSION, SITUATIONAL -Has felt desponded since daughter passed away and family left on vacation and felt like "ending it all" since past month. Denies suicidal or homicidal ideations. SNF - monitor  NSVT-Seen on telemetry on 6/7 &6/8. Potassium 3.8 and magnesium 2.4. Could also be A. fib with aberrancy but seems less likely. 2-D echo 5/20: shows LVEF 60-65 percent. May consider  increasing beta blockers if blood pressure and heart rate tolerate. No further episodes  SNF - will monitor  FTT - fullscope of care at this time  HLD SNF - controlled cont zocor 40 mg   Time spent > 49min;> 50% of time with patient was spent reviewing records, labs, tests and studies, counseling and developing plan of care  Thurston Hole D. Tony Hollingshead, MD

## 2016-08-20 ENCOUNTER — Encounter: Payer: Self-pay | Admitting: Internal Medicine

## 2016-08-22 LAB — BASIC METABOLIC PANEL
BUN: 38 — AB (ref 4–21)
Creatinine: 1.3 (ref 0.6–1.3)
Glucose: 126
POTASSIUM: 4.5 (ref 3.4–5.3)
SODIUM: 143 (ref 137–147)

## 2016-08-22 LAB — CBC AND DIFFERENTIAL
HCT: 32 — AB (ref 41–53)
Hemoglobin: 9.9 — AB (ref 13.5–17.5)
Platelets: 191 (ref 150–399)
WBC: 6.2

## 2016-08-23 DIAGNOSIS — F4321 Adjustment disorder with depressed mood: Secondary | ICD-10-CM | POA: Insufficient documentation

## 2016-08-23 DIAGNOSIS — I4819 Other persistent atrial fibrillation: Secondary | ICD-10-CM

## 2016-08-23 DIAGNOSIS — I4729 Other ventricular tachycardia: Secondary | ICD-10-CM | POA: Insufficient documentation

## 2016-08-23 DIAGNOSIS — I472 Ventricular tachycardia: Secondary | ICD-10-CM

## 2016-08-23 DIAGNOSIS — J9601 Acute respiratory failure with hypoxia: Secondary | ICD-10-CM

## 2016-08-23 DIAGNOSIS — E785 Hyperlipidemia, unspecified: Secondary | ICD-10-CM

## 2016-08-23 HISTORY — DX: Other persistent atrial fibrillation: I48.19

## 2016-08-23 HISTORY — DX: Acute respiratory failure with hypoxia: J96.01

## 2016-08-23 HISTORY — DX: Ventricular tachycardia: I47.2

## 2016-08-23 HISTORY — DX: Hyperlipidemia, unspecified: E78.5

## 2016-08-23 HISTORY — DX: Other ventricular tachycardia: I47.29

## 2016-09-16 ENCOUNTER — Encounter: Payer: Self-pay | Admitting: Internal Medicine

## 2016-09-16 ENCOUNTER — Non-Acute Institutional Stay (SKILLED_NURSING_FACILITY): Payer: Medicare Other | Admitting: Internal Medicine

## 2016-09-16 DIAGNOSIS — I5031 Acute diastolic (congestive) heart failure: Secondary | ICD-10-CM | POA: Diagnosis not present

## 2016-09-16 DIAGNOSIS — I480 Paroxysmal atrial fibrillation: Secondary | ICD-10-CM | POA: Diagnosis not present

## 2016-09-16 DIAGNOSIS — J9601 Acute respiratory failure with hypoxia: Secondary | ICD-10-CM | POA: Diagnosis not present

## 2016-09-16 DIAGNOSIS — A419 Sepsis, unspecified organism: Secondary | ICD-10-CM

## 2016-09-16 DIAGNOSIS — F4321 Adjustment disorder with depressed mood: Secondary | ICD-10-CM

## 2016-09-16 DIAGNOSIS — D638 Anemia in other chronic diseases classified elsewhere: Secondary | ICD-10-CM

## 2016-09-16 DIAGNOSIS — I4819 Other persistent atrial fibrillation: Secondary | ICD-10-CM

## 2016-09-16 DIAGNOSIS — J189 Pneumonia, unspecified organism: Secondary | ICD-10-CM

## 2016-09-16 DIAGNOSIS — N184 Chronic kidney disease, stage 4 (severe): Secondary | ICD-10-CM | POA: Diagnosis not present

## 2016-09-16 DIAGNOSIS — Z794 Long term (current) use of insulin: Secondary | ICD-10-CM

## 2016-09-16 DIAGNOSIS — I481 Persistent atrial fibrillation: Secondary | ICD-10-CM | POA: Diagnosis not present

## 2016-09-16 DIAGNOSIS — N183 Chronic kidney disease, stage 3 unspecified: Secondary | ICD-10-CM

## 2016-09-16 DIAGNOSIS — I10 Essential (primary) hypertension: Secondary | ICD-10-CM

## 2016-09-16 DIAGNOSIS — E1122 Type 2 diabetes mellitus with diabetic chronic kidney disease: Secondary | ICD-10-CM | POA: Diagnosis not present

## 2016-09-16 NOTE — Progress Notes (Signed)
Location:  [facility] Room Number: 109 Place of Service:  SNF [phone])  Provider: Randon Goldsmith. [name], MD  PCP: [name], MD Patient Care Team: [name], MD as PCP - General [facility] Medicine)  Extended Emergency Contact Information Primary Emergency Contact: [place] of [place] Home Phone: [phone] Mobile Phone: [phone] Relation: Daughter  No Known Allergies  Chief Complaint  Patient presents with  . Discharge Note    discharge from SNF to home    HPI:  83 y.o. male with HTN, CAD, DM 2, stage IV chronic kidney disease, A. fib on Eliquis, b/l BKA, recent hospitalization 5/19-5/21 for new onset A. fib, symptomatic anemia (transfused 2 units PRBCs), since discharge not completely recovered. PT lives at home, moves around with the help of a wheelchair, not on home oxygen.treated for bronchitis with doxycycline, presented with progressive dyspnea, mostly nonproductive cough and abdominal distention and pain. Hypoxic by EMS at 86% on room air. Required BiPAP in ED. Febrile 101.4, tachycardic, tachypnea, hypertensive and hypoxic in ED. Chest x-ray suggested pulmonary edema, possible left middle lobe pneumonia . Pt was admitted to [facility] from 6/4-11 for sepsis 2/2 HCAP, acute resp failure with hypoxia and acute diastolic CHF. Hospital course was complicated by NSVT. All of the above were resolved. Pt is admitted to [facility] generalized weakness for OT/PT.Pt is now ready to be d/c to home.     Past Medical History:  Diagnosis Date  . Acute diastolic heart failure (HCC)   . Atrial fibrillation with slow ventricular response (HCC) 07/28/2016  . BPH (benign prostatic hyperplasia)   . CAD (coronary artery disease)   . CKD (chronic kidney disease), stage III 07/26/2016  . CKD (chronic kidney disease), stage IV (HCC)   . Diabetes mellitus without complication (HCC)   . HLD (hyperlipidemia) 08/23/2016  . Hypertension   .  Kidney disease   . Normocytic anemia 07/26/2016  . Paroxysmal A-fib (HCC) 07/28/2016  . S/P bilateral below knee amputation (HCC) 07/26/2016  . S/P bilateral BKA (below knee amputation) (HCC)   . Type 2 diabetes mellitus (HCC) 07/26/2016    Past Surgical History:  Procedure Laterality Date  . BELOW KNEE LEG AMPUTATION Bilateral   . CORONARY ARTERY BYPASS GRAFT    . PROSTATE SURGERY    . SHOULDER SURGERY       reports that he has quit smoking. He quit after 20.00 years of use. He has never used smokeless tobacco. He reports that he does not drink alcohol or use drugs. Social History   Social History  . Marital status: Single    Spouse name: N/A  . Number of children: N/A  . Years of education: N/A   Occupational History  . Not on file.   Social History Main Topics  . Smoking status: Former Smoker    Years: 20.00  . Smokeless tobacco: Never Used  . Alcohol use No  . Drug use: No  . Sexual activity: No   Other Topics Concern  . Not on file   Social History Narrative  . No narrative on file    Pertinent  Health Maintenance Due  Topic Date Due  . FOOT EXAM  05/17/1938  . OPHTHALMOLOGY EXAM  [birth year]17/1938  . URINE MICROALBUMIN  [birth year]17/1938  . PNA vac Low Risk Adult (1 of 2 - PCV13) 05/18/1995  . INFLUENZA VACCINE  10/08/2016  . HEMOGLOBIN A1C  02/11/2017    Medications: Allergies as of 09/16/2016  No Known Allergies     Medication List       Accurate as of 09/16/16 11:52 AM. Always use your most recent med list.          allopurinol 300 MG tablet Commonly known as:  ZYLOPRIM Take 0.5 tablets (150 mg total) by mouth daily.   amLODipine 10 MG tablet Commonly known as:  NORVASC Take 10 mg by mouth daily.   apixaban 2.5 MG Tabs tablet Commonly known as:  ELIQUIS Take 1 tablet (2.5 mg total) by mouth 2 (two) times daily.   ferrous sulfate 325 (65 FE) MG tablet Take 325 mg by mouth daily with breakfast.   hydrALAZINE 50 MG tablet Commonly known as:   APRESOLINE Take 1 tablet (50 mg total) by mouth 3 (three) times daily.   hydrocortisone 2.5 % rectal cream Commonly known as:  ANUSOL-HC Place rectally 2 (two) times daily as needed for hemorrhoids or itching.   insulin aspart 100 UNIT/ML injection Commonly known as:  novoLOG Inject 3 Units into the skin 3 (three) times daily with meals.   insulin detemir 100 UNIT/ML injection Commonly known as:  LEVEMIR Inject 16 Units into the skin daily.   Melatonin 3 MG Tabs Take by mouth. Take one at bedtime for rest   metoprolol tartrate 25 MG tablet Commonly known as:  LOPRESSOR Take 1 tablet (25 mg total) by mouth 2 (two) times daily.   polyethylene glycol packet Commonly known as:  MIRALAX / GLYCOLAX Take 17 g by mouth daily.   senna-docusate 8.6-50 MG tablet Commonly known as:  Senokot-S Take 1 tablet by mouth at bedtime.   simvastatin 40 MG tablet Commonly known as:  ZOCOR Take 40 mg by mouth every evening.        Vitals:   09/16/16 1100  BP: (!) 165/59  Pulse: (!) 52  Resp: 18  Temp: (!) 97 F (36.1 C)  SpO2: 97%  Weight: 193 lb 6.4 oz (87.7 kg)  Height: 5\' 6"  (1.676 m)   Body mass index is 31.22 kg/m.  Physical Exam  GENERAL APPEARANCE: Alert, conversant. No acute distress.  HEENT: Unremarkable. RESPIRATORY: Breathing is even, unlabored. Lung sounds are clear   CARDIOVASCULAR: Heart irreg no murmurs, rubs or gallops. No peripheral edema.  GASTROINTESTINAL: Abdomen is soft, non-tender, not distended w/ normal bowel sounds.  NEUROLOGIC: Cranial nerves 2-12 grossly intact. Moves all extremities   Labs reviewed: Basic Metabolic Panel:  Recent Labs  16/12/9603/20/18 0921  08/14/16 2227  08/16/16 0521  08/17/16 1214 08/18/16 08/18/16 0224 08/22/16  NA 133*  < > 138  < > 135  < > 133* 134* 134* 143  K 4.0  < > 3.8  < > 3.9  < > 4.0  --  3.8 4.5  CL 107  < > 106  < > 105  --  105  --  105  --   CO2 20*  < > 22  < > 20*  --  19*  --  20*  --   GLUCOSE 184*  < >  109*  < > 161*  --  219*  --  206*  --   BUN 74*  < > 81*  < > 82*  < > 76* 75* 75* 38*  CREATININE 2.32*  < > 2.48*  < > 2.34*  < > 2.09* 2.1* 2.12* 1.3  CALCIUM 8.5*  < > 9.2  < > 8.7*  --  8.8*  --  8.8*  --  MG 2.2  --  2.4  --   --   --   --   --   --   --   < > = values in this interval not displayed. No results found for: [facility] Liver Function Tests:  Recent Labs  08/11/16 1727 08/14/16 08/14/16 0503 08/15/16 0316  AST 21  21 28 28 28   ALT 23  23 31 31  34  ALKPHOS 89  89 78 78 74  BILITOT 1.3*  --  1.2 0.9  PROT 6.5  --  6.6 6.0*  ALBUMIN 3.7  --  3.4* 3.1*    Recent Labs  08/11/16 1727  LIPASE 17   No results for input(s): AMMONIA in the last 8760 hours. CBC:  Recent Labs  08/12/16 0346  08/14/16 0503  08/15/16 0316  08/16/16 0521 08/17/16 08/17/16 1214 08/22/16  WBC 6.9  < > 6.9  < > 6.2  < > 5.4 5.7 5.7 6.2  NEUTROABS 5.3  --  5.2  --   --   --  3.5  --   --   --   HGB 8.7*  < > 9.2*  < > 9.2*  < > 8.2*  --  8.6* 9.9*  HCT 27.4*  < > 28.5*  < > 29.2*  < > 25.7*  --  26.6* 32*  MCV 91.9  < > 91.6  --  93.3  --  91.1  --  92.4  --   PLT 125*  < > PLATELET CLUMPS NOTED ON SMEAR, UNABLE TO ESTIMATE  < > 159  < > 158  --  166 191  < > = values in this interval not displayed. Lipid No results for input(s): CHOL, HDL, LDLCALC, TRIG in the last 8760 hours. Cardiac Enzymes:  Recent Labs  07/27/16 0921  07/28/16 0143 08/12/16 0346 08/12/16 0839  CKTOTAL 60  --   --   --   --   CKMB 2.7  --   --   --   --   TROPONINI 0.39*  < > 0.36* 0.71*  0.76* 0.76*  < > = values in this interval not displayed. BNP:  Recent Labs  08/11/16 1727  BNP 907.9*   CBG:  Recent Labs  08/17/16 2157 08/18/16 0741 08/18/16 1118  GLUCAP 210* 174* 197*    Procedures and Imaging Studies During Stay: Dg Chest 2 View  Result Date: 08/17/2016 CLINICAL DATA:  CHF EXAM: CHEST  2 VIEW COMPARISON:  08/11/2016 FINDINGS: Low lung volumes. No focal consolidation or  frank interstitial edema. Suspected small bilateral pleural effusions on the lateral view. Cardiomegaly.  Postsurgical changes related to prior CABG. Median sternotomy. Degenerative changes of the visualized thoracolumbar spine. IMPRESSION: Low lung volumes without frank interstitial edema. Suspected small bilateral pleural effusions. Electronically Signed   By: Charline Bills M.D.   On: 08/17/2016 13:06    Assessment/Plan:   Sepsis, due to unspecified organism (HCC)  HCAP (healthcare-associated pneumonia)  Acute respiratory failure with hypoxia (HCC)  Acute diastolic heart failure (HCC)  Paroxysmal A-fib (HCC)  Persistent atrial fibrillation (HCC)  Essential hypertension  CKD (chronic kidney disease), stage IV (HCC)  Anemia of chronic disease  Situational depression  Type 2 diabetes mellitus with stage 3 chronic kidney disease, with long-term current use of insulin (HCC)   Patient is being discharged with the following home health services:  OT/PT/Nursing  Patient is being discharged with the following durable medical equipment:  none  Patient has been advised to  f/u with their PCP in 1-2 weeks to bring them up to date on their rehab stay.  Social services at facility was responsible for arranging this appointment.  Pt was provided with a 30 day supply of prescriptions for medications and refills must be obtained from their PCP.  For controlled substances, a more limited supply may be provided adequate until PCP appointment only.  Future labs/tests needed: Pt need f/u CXR week of 7/16. Medications: Reconciled  tIME SPENT > 30 MIN;> 50% of time with patient was spent reviewing records, labs, tests and studies, counseling and developing plan of care  Thurston Hole D. [name], MD

## 2016-09-17 ENCOUNTER — Telehealth: Payer: Self-pay | Admitting: Interventional Cardiology

## 2016-09-17 NOTE — Telephone Encounter (Signed)
Patient's daughter wants to bring in monitor, that she stated was not used. Will have someone from monitors call and instruct patient on what to do with this monitor.

## 2016-09-17 NOTE — Telephone Encounter (Signed)
New Message  Pt daughter call requesting to speak with RN. Pt daughter states she would like tp bring the monitor back pt received. She states pt did not wear the monitor and she does not want him to be charged for it. Please call back to discuss

## 2016-09-17 NOTE — Telephone Encounter (Signed)
Left message to call the office back.

## 2016-09-17 NOTE — Telephone Encounter (Signed)
Left message that monitor does not need to be returned to our office.  [employer] has a shipping bag and prepaid postage in the monitor box.  This can be shipped directly to the monitor company.  Also stated monitor was used.  Final report has already been imported and marked final by physician.  Contact physician for test results.

## 2016-09-17 NOTE — Telephone Encounter (Signed)
Follow up ° ° ° °Pt daughter is returning call to RN. °

## 2016-09-23 ENCOUNTER — Ambulatory Visit: Payer: Medicare Other | Admitting: Interventional Cardiology

## 2016-11-17 ENCOUNTER — Emergency Department (HOSPITAL_BASED_OUTPATIENT_CLINIC_OR_DEPARTMENT_OTHER)
Admit: 2016-11-17 | Discharge: 2016-11-17 | Disposition: A | Discharge status: Home / Self Care | Payer: Medicare Other | Attending: Emergency Medicine | Admitting: Emergency Medicine

## 2016-11-17 ENCOUNTER — Inpatient Hospital Stay (HOSPITAL_COMMUNITY)
Admission: EM | Admit: 2016-11-17 | Discharge: 2016-11-26 | DRG: 871 | Disposition: A | Discharge status: Skilled Nursing Facility | Payer: Medicare Other | Attending: Internal Medicine | Admitting: Internal Medicine

## 2016-11-17 ENCOUNTER — Encounter (HOSPITAL_COMMUNITY): Payer: Self-pay | Admitting: Emergency Medicine

## 2016-11-17 ENCOUNTER — Emergency Department (HOSPITAL_COMMUNITY): Payer: Medicare Other

## 2016-11-17 DIAGNOSIS — N390 Urinary tract infection, site not specified: Secondary | ICD-10-CM | POA: Diagnosis present

## 2016-11-17 DIAGNOSIS — E785 Hyperlipidemia, unspecified: Secondary | ICD-10-CM | POA: Diagnosis present

## 2016-11-17 DIAGNOSIS — I361 Nonrheumatic tricuspid (valve) insufficiency: Secondary | ICD-10-CM | POA: Diagnosis not present

## 2016-11-17 DIAGNOSIS — G9341 Metabolic encephalopathy: Secondary | ICD-10-CM | POA: Diagnosis present

## 2016-11-17 DIAGNOSIS — E43 Unspecified severe protein-calorie malnutrition: Secondary | ICD-10-CM | POA: Diagnosis present

## 2016-11-17 DIAGNOSIS — N3000 Acute cystitis without hematuria: Secondary | ICD-10-CM | POA: Diagnosis not present

## 2016-11-17 DIAGNOSIS — J9601 Acute respiratory failure with hypoxia: Secondary | ICD-10-CM | POA: Diagnosis present

## 2016-11-17 DIAGNOSIS — I472 Ventricular tachycardia: Secondary | ICD-10-CM | POA: Diagnosis not present

## 2016-11-17 DIAGNOSIS — N179 Acute kidney failure, unspecified: Secondary | ICD-10-CM | POA: Diagnosis present

## 2016-11-17 DIAGNOSIS — L89151 Pressure ulcer of sacral region, stage 1: Secondary | ICD-10-CM | POA: Diagnosis present

## 2016-11-17 DIAGNOSIS — Z6832 Body mass index (BMI) 32.0-32.9, adult: Secondary | ICD-10-CM | POA: Diagnosis not present

## 2016-11-17 DIAGNOSIS — M7989 Other specified soft tissue disorders: Secondary | ICD-10-CM

## 2016-11-17 DIAGNOSIS — Z89512 Acquired absence of left leg below knee: Secondary | ICD-10-CM | POA: Diagnosis not present

## 2016-11-17 DIAGNOSIS — I130 Hypertensive heart and chronic kidney disease with heart failure and stage 1 through stage 4 chronic kidney disease, or unspecified chronic kidney disease: Secondary | ICD-10-CM | POA: Diagnosis present

## 2016-11-17 DIAGNOSIS — R945 Abnormal results of liver function studies: Secondary | ICD-10-CM

## 2016-11-17 DIAGNOSIS — Z66 Do not resuscitate: Secondary | ICD-10-CM | POA: Diagnosis present

## 2016-11-17 DIAGNOSIS — E8770 Fluid overload, unspecified: Secondary | ICD-10-CM | POA: Diagnosis present

## 2016-11-17 DIAGNOSIS — I10 Essential (primary) hypertension: Secondary | ICD-10-CM | POA: Diagnosis not present

## 2016-11-17 DIAGNOSIS — J90 Pleural effusion, not elsewhere classified: Secondary | ICD-10-CM | POA: Diagnosis present

## 2016-11-17 DIAGNOSIS — L8945 Pressure ulcer of contiguous site of back, buttock and hip, unstageable: Secondary | ICD-10-CM | POA: Diagnosis not present

## 2016-11-17 DIAGNOSIS — I4819 Other persistent atrial fibrillation: Secondary | ICD-10-CM | POA: Diagnosis present

## 2016-11-17 DIAGNOSIS — R0600 Dyspnea, unspecified: Secondary | ICD-10-CM

## 2016-11-17 DIAGNOSIS — N400 Enlarged prostate without lower urinary tract symptoms: Secondary | ICD-10-CM | POA: Diagnosis present

## 2016-11-17 DIAGNOSIS — L8990 Pressure ulcer of unspecified site, unspecified stage: Secondary | ICD-10-CM | POA: Insufficient documentation

## 2016-11-17 DIAGNOSIS — L89311 Pressure ulcer of right buttock, stage 1: Secondary | ICD-10-CM | POA: Diagnosis not present

## 2016-11-17 DIAGNOSIS — I2510 Atherosclerotic heart disease of native coronary artery without angina pectoris: Secondary | ICD-10-CM | POA: Diagnosis present

## 2016-11-17 DIAGNOSIS — M109 Gout, unspecified: Secondary | ICD-10-CM | POA: Diagnosis present

## 2016-11-17 DIAGNOSIS — A419 Sepsis, unspecified organism: Secondary | ICD-10-CM | POA: Diagnosis present

## 2016-11-17 DIAGNOSIS — I340 Nonrheumatic mitral (valve) insufficiency: Secondary | ICD-10-CM | POA: Diagnosis not present

## 2016-11-17 DIAGNOSIS — E876 Hypokalemia: Secondary | ICD-10-CM | POA: Diagnosis present

## 2016-11-17 DIAGNOSIS — K7689 Other specified diseases of liver: Secondary | ICD-10-CM | POA: Diagnosis not present

## 2016-11-17 DIAGNOSIS — Z993 Dependence on wheelchair: Secondary | ICD-10-CM

## 2016-11-17 DIAGNOSIS — L89321 Pressure ulcer of left buttock, stage 1: Secondary | ICD-10-CM

## 2016-11-17 DIAGNOSIS — R4182 Altered mental status, unspecified: Secondary | ICD-10-CM

## 2016-11-17 DIAGNOSIS — Z515 Encounter for palliative care: Secondary | ICD-10-CM | POA: Diagnosis not present

## 2016-11-17 DIAGNOSIS — R319 Hematuria, unspecified: Secondary | ICD-10-CM | POA: Diagnosis present

## 2016-11-17 DIAGNOSIS — N184 Chronic kidney disease, stage 4 (severe): Secondary | ICD-10-CM | POA: Diagnosis present

## 2016-11-17 DIAGNOSIS — I481 Persistent atrial fibrillation: Secondary | ICD-10-CM | POA: Diagnosis present

## 2016-11-17 DIAGNOSIS — E1122 Type 2 diabetes mellitus with diabetic chronic kidney disease: Secondary | ICD-10-CM | POA: Diagnosis present

## 2016-11-17 DIAGNOSIS — I5033 Acute on chronic diastolic (congestive) heart failure: Secondary | ICD-10-CM | POA: Diagnosis present

## 2016-11-17 DIAGNOSIS — Z794 Long term (current) use of insulin: Secondary | ICD-10-CM

## 2016-11-17 DIAGNOSIS — R0902 Hypoxemia: Secondary | ICD-10-CM

## 2016-11-17 DIAGNOSIS — N3001 Acute cystitis with hematuria: Secondary | ICD-10-CM | POA: Diagnosis not present

## 2016-11-17 DIAGNOSIS — Z7901 Long term (current) use of anticoagulants: Secondary | ICD-10-CM

## 2016-11-17 DIAGNOSIS — L89304 Pressure ulcer of unspecified buttock, stage 4: Secondary | ICD-10-CM | POA: Diagnosis not present

## 2016-11-17 DIAGNOSIS — Z89511 Acquired absence of right leg below knee: Secondary | ICD-10-CM | POA: Diagnosis not present

## 2016-11-17 DIAGNOSIS — Z87891 Personal history of nicotine dependence: Secondary | ICD-10-CM

## 2016-11-17 DIAGNOSIS — Z79899 Other long term (current) drug therapy: Secondary | ICD-10-CM

## 2016-11-17 DIAGNOSIS — T8789 Other complications of amputation stump: Secondary | ICD-10-CM

## 2016-11-17 DIAGNOSIS — R0602 Shortness of breath: Secondary | ICD-10-CM

## 2016-11-17 DIAGNOSIS — Z951 Presence of aortocoronary bypass graft: Secondary | ICD-10-CM

## 2016-11-17 DIAGNOSIS — N183 Chronic kidney disease, stage 3 (moderate): Secondary | ICD-10-CM | POA: Diagnosis not present

## 2016-11-17 DIAGNOSIS — E119 Type 2 diabetes mellitus without complications: Secondary | ICD-10-CM

## 2016-11-17 DIAGNOSIS — L89154 Pressure ulcer of sacral region, stage 4: Secondary | ICD-10-CM | POA: Diagnosis not present

## 2016-11-17 DIAGNOSIS — R410 Disorientation, unspecified: Secondary | ICD-10-CM | POA: Diagnosis present

## 2016-11-17 LAB — URINALYSIS, ROUTINE W REFLEX MICROSCOPIC
Bilirubin Urine: NEGATIVE
GLUCOSE, UA: NEGATIVE mg/dL
Ketones, ur: NEGATIVE mg/dL
NITRITE: NEGATIVE
PH: 5 (ref 5.0–8.0)
PROTEIN: 100 mg/dL — AB
SPECIFIC GRAVITY, URINE: 1.01 (ref 1.005–1.030)

## 2016-11-17 LAB — CBC WITH DIFFERENTIAL/PLATELET
BASOS PCT: 0 %
Basophils Absolute: 0 10*3/uL (ref 0.0–0.1)
EOS ABS: 0 10*3/uL (ref 0.0–0.7)
EOS PCT: 0 %
HCT: 27.5 % — ABNORMAL LOW (ref 39.0–52.0)
Hemoglobin: 8.9 g/dL — ABNORMAL LOW (ref 13.0–17.0)
LYMPHS ABS: 1.3 10*3/uL (ref 0.7–4.0)
Lymphocytes Relative: 10 %
MCH: 28.6 pg (ref 26.0–34.0)
MCHC: 32.4 g/dL (ref 30.0–36.0)
MCV: 88.4 fL (ref 78.0–100.0)
MONO ABS: 1.6 10*3/uL — AB (ref 0.1–1.0)
MONOS PCT: 12 %
NEUTROS PCT: 78 %
Neutro Abs: 10.2 10*3/uL — ABNORMAL HIGH (ref 1.7–7.7)
PLATELETS: 121 10*3/uL — AB (ref 150–400)
RBC: 3.11 MIL/uL — ABNORMAL LOW (ref 4.22–5.81)
RDW: 17.5 % — AB (ref 11.5–15.5)
WBC: 13.2 10*3/uL — ABNORMAL HIGH (ref 4.0–10.5)

## 2016-11-17 LAB — COMPREHENSIVE METABOLIC PANEL
ALK PHOS: 68 U/L (ref 38–126)
ALT: 25 U/L (ref 17–63)
AST: 47 U/L — AB (ref 15–41)
Albumin: 2.6 g/dL — ABNORMAL LOW (ref 3.5–5.0)
Anion gap: 8 (ref 5–15)
BUN: 40 mg/dL — AB (ref 6–20)
CALCIUM: 7.2 mg/dL — AB (ref 8.9–10.3)
CHLORIDE: 111 mmol/L (ref 101–111)
CO2: 20 mmol/L — AB (ref 22–32)
CREATININE: 1.87 mg/dL — AB (ref 0.61–1.24)
GFR calc non Af Amer: 31 mL/min — ABNORMAL LOW (ref >60)
GFR, EST AFRICAN AMERICAN: 36 mL/min — AB (ref >60)
Glucose, Bld: 245 mg/dL — ABNORMAL HIGH (ref 65–99)
Potassium: 2.1 mmol/L — CL (ref 3.5–5.1)
SODIUM: 139 mmol/L (ref 135–145)
Total Bilirubin: 0.7 mg/dL (ref 0.3–1.2)
Total Protein: 4.9 g/dL — ABNORMAL LOW (ref 6.5–8.1)

## 2016-11-17 LAB — MAGNESIUM: Magnesium: 1.8 mg/dL (ref 1.7–2.4)

## 2016-11-17 LAB — GLUCOSE, CAPILLARY: Glucose-Capillary: 234 mg/dL — ABNORMAL HIGH (ref 65–99)

## 2016-11-17 LAB — BRAIN NATRIURETIC PEPTIDE: B Natriuretic Peptide: 702.1 pg/mL — ABNORMAL HIGH (ref 0.0–100.0)

## 2016-11-17 LAB — I-STAT CG4 LACTIC ACID, ED: LACTIC ACID, VENOUS: 1.34 mmol/L (ref 0.5–1.9)

## 2016-11-17 MED ORDER — ALLOPURINOL 300 MG PO TABS
300.0000 mg | ORAL_TABLET | Freq: Every day | ORAL | Status: DC
  Administered 2016-11-19 – 2016-11-26 (×7): 300 mg via ORAL
  Filled 2016-11-17 (×8): qty 1

## 2016-11-17 MED ORDER — PIPERACILLIN-TAZOBACTAM 3.375 G IVPB
3.3750 g | Freq: Three times a day (TID) | INTRAVENOUS | Status: DC
  Administered 2016-11-18 – 2016-11-20 (×7): 3.375 g via INTRAVENOUS
  Filled 2016-11-17 (×11): qty 50

## 2016-11-17 MED ORDER — ACETAMINOPHEN 325 MG PO TABS: 650.0000 mg | ORAL_TABLET | Freq: Four times a day (QID) | ORAL | Status: DC | PRN

## 2016-11-17 MED ORDER — SODIUM CHLORIDE 0.9 % IV SOLN: 250.0000 mL | INTRAVENOUS | Status: DC | PRN

## 2016-11-17 MED ORDER — VANCOMYCIN HCL IN DEXTROSE 1-5 GM/200ML-% IV SOLN: 1000.0000 mg | Freq: Once | INTRAVENOUS | Status: DC

## 2016-11-17 MED ORDER — HYDRALAZINE HCL 25 MG PO TABS
25.0000 mg | ORAL_TABLET | Freq: Two times a day (BID) | ORAL | Status: DC
  Administered 2016-11-18 – 2016-11-26 (×14): 25 mg via ORAL
  Filled 2016-11-17 (×16): qty 1

## 2016-11-17 MED ORDER — SODIUM CHLORIDE 0.9% FLUSH: 3.0000 mL | INTRAVENOUS | Status: DC | PRN

## 2016-11-17 MED ORDER — APIXABAN 2.5 MG PO TABS
2.5000 mg | ORAL_TABLET | Freq: Two times a day (BID) | ORAL | Status: DC
  Administered 2016-11-18: 2.5 mg via ORAL
  Filled 2016-11-17 (×2): qty 1

## 2016-11-17 MED ORDER — VANCOMYCIN HCL IN DEXTROSE 1-5 GM/200ML-% IV SOLN: 1000.0000 mg | INTRAVENOUS | Status: DC

## 2016-11-17 MED ORDER — VANCOMYCIN HCL 10 G IV SOLR
1750.0000 mg | Freq: Once | INTRAVENOUS | Status: AC
  Administered 2016-11-17: 1750 mg via INTRAVENOUS
  Filled 2016-11-17: qty 1750

## 2016-11-17 MED ORDER — POTASSIUM CHLORIDE CRYS ER 10 MEQ PO TBCR
10.0000 meq | EXTENDED_RELEASE_TABLET | Freq: Every day | ORAL | Status: DC
  Filled 2016-11-17: qty 1

## 2016-11-17 MED ORDER — AMLODIPINE BESYLATE 10 MG PO TABS
10.0000 mg | ORAL_TABLET | Freq: Every day | ORAL | Status: DC
  Administered 2016-11-19 – 2016-11-26 (×7): 10 mg via ORAL
  Filled 2016-11-17 (×8): qty 1

## 2016-11-17 MED ORDER — SENNOSIDES-DOCUSATE SODIUM 8.6-50 MG PO TABS
1.0000 | ORAL_TABLET | Freq: Every day | ORAL | Status: DC
  Administered 2016-11-18 – 2016-11-25 (×8): 1 via ORAL
  Filled 2016-11-17 (×8): qty 1

## 2016-11-17 MED ORDER — POTASSIUM CHLORIDE CRYS ER 20 MEQ PO TBCR
40.0000 meq | EXTENDED_RELEASE_TABLET | Freq: Once | ORAL | Status: AC
  Administered 2016-11-17: 40 meq via ORAL
  Filled 2016-11-17: qty 2

## 2016-11-17 MED ORDER — MINOXIDIL 2.5 MG PO TABS
2.5000 mg | ORAL_TABLET | Freq: Two times a day (BID) | ORAL | Status: DC
  Administered 2016-11-18 – 2016-11-26 (×13): 2.5 mg via ORAL
  Filled 2016-11-17 (×19): qty 1

## 2016-11-17 MED ORDER — SODIUM CHLORIDE 0.9 % IV BOLUS (SEPSIS)
500.0000 mL | Freq: Once | INTRAVENOUS | Status: AC
  Administered 2016-11-17: 500 mL via INTRAVENOUS

## 2016-11-17 MED ORDER — ACETAMINOPHEN 650 MG RE SUPP
650.0000 mg | Freq: Four times a day (QID) | RECTAL | Status: DC | PRN
Start: 2016-11-17 — End: 2016-11-26

## 2016-11-17 MED ORDER — POTASSIUM CHLORIDE 10 MEQ/100ML IV SOLN
10.0000 meq | INTRAVENOUS | Status: AC
  Administered 2016-11-17 – 2016-11-18 (×4): 10 meq via INTRAVENOUS
  Filled 2016-11-17 (×4): qty 100

## 2016-11-17 MED ORDER — SODIUM CHLORIDE 0.9% FLUSH
3.0000 mL | Freq: Two times a day (BID) | INTRAVENOUS | Status: DC
  Administered 2016-11-19 – 2016-11-22 (×8): 3 mL via INTRAVENOUS

## 2016-11-17 MED ORDER — SIMVASTATIN 40 MG PO TABS
40.0000 mg | ORAL_TABLET | Freq: Every evening | ORAL | Status: DC
  Administered 2016-11-18: 40 mg via ORAL
  Filled 2016-11-17 (×2): qty 1

## 2016-11-17 MED ORDER — PIPERACILLIN-TAZOBACTAM 3.375 G IVPB 30 MIN
3.3750 g | Freq: Once | INTRAVENOUS | Status: AC
  Administered 2016-11-17: 3.375 g via INTRAVENOUS
  Filled 2016-11-17: qty 50

## 2016-11-17 MED ORDER — MAGNESIUM SULFATE 2 GM/50ML IV SOLN
2.0000 g | Freq: Once | INTRAVENOUS | Status: AC
  Administered 2016-11-17: 2 g via INTRAVENOUS
  Filled 2016-11-17: qty 50

## 2016-11-17 MED ORDER — FUROSEMIDE 20 MG PO TABS: 20.0000 mg | ORAL_TABLET | Freq: Every morning | ORAL | Status: DC

## 2016-11-17 MED ORDER — INSULIN ASPART 100 UNIT/ML ~~LOC~~ SOLN
0.0000 [IU] | Freq: Three times a day (TID) | SUBCUTANEOUS | Status: DC
  Administered 2016-11-18: 3 [IU] via SUBCUTANEOUS
  Administered 2016-11-18 – 2016-11-19 (×5): 2 [IU] via SUBCUTANEOUS
  Administered 2016-11-20: 3 [IU] via SUBCUTANEOUS
  Administered 2016-11-20 (×2): 2 [IU] via SUBCUTANEOUS
  Administered 2016-11-21 (×2): 3 [IU] via SUBCUTANEOUS
  Administered 2016-11-21: 5 [IU] via SUBCUTANEOUS
  Administered 2016-11-22 (×2): 3 [IU] via SUBCUTANEOUS
  Administered 2016-11-22 – 2016-11-24 (×3): 5 [IU] via SUBCUTANEOUS
  Administered 2016-11-24: 2 [IU] via SUBCUTANEOUS
  Administered 2016-11-24: 3 [IU] via SUBCUTANEOUS
  Administered 2016-11-25 – 2016-11-26 (×5): 2 [IU] via SUBCUTANEOUS

## 2016-11-17 MED ORDER — POLYETHYLENE GLYCOL 3350 17 G PO PACK
17.0000 g | PACK | Freq: Every day | ORAL | Status: DC
  Administered 2016-11-19 – 2016-11-26 (×6): 17 g via ORAL
  Filled 2016-11-17 (×7): qty 1

## 2016-11-17 MED ORDER — METOPROLOL TARTRATE 25 MG PO TABS
25.0000 mg | ORAL_TABLET | Freq: Two times a day (BID) | ORAL | Status: DC
  Administered 2016-11-18 – 2016-11-26 (×14): 25 mg via ORAL
  Filled 2016-11-17 (×16): qty 1

## 2016-11-17 MED ORDER — INSULIN ASPART 100 UNIT/ML ~~LOC~~ SOLN
0.0000 [IU] | Freq: Every day | SUBCUTANEOUS | Status: DC
Start: 2016-11-17 — End: 2016-11-26
  Administered 2016-11-18 – 2016-11-22 (×5): 2 [IU] via SUBCUTANEOUS

## 2016-11-17 NOTE — ED Triage Notes (Signed)
Per EMS: pt c/o of weakness today. Pt A&Ox4.  Pt unable to stand on his own starting today, slurred speech, and R facial droop. Pitting edema in R upper arm. Hx of A-Fib and CHF.  Bilateral BK amputee.   Home health nurse was there last Thursday and said pt was LKW at that time. Pt might not has taken Lasix for a couple days, per EMS.

## 2016-11-17 NOTE — ED Notes (Signed)
Date and time results received: 11/17/16  20:03   Test: Potassium Critical Value: 2.1  Name of Provider Notified: Will Dansie

## 2016-11-17 NOTE — Progress Notes (Signed)
VASCULAR LAB PRELIMINARY  PRELIMINARY  PRELIMINARY  PRELIMINARY  Right upper extremity venous duplex completed.    Preliminary report:  REight :  No evidence of DVT or superficial thrombosis.    Demita Tobia, RVS 11/17/2016, 6:15 PM

## 2016-11-17 NOTE — ED Notes (Signed)
Will - PA aware of pt's rectal temp (101.4).

## 2016-11-17 NOTE — ED Notes (Addendum)
The highest amount bladder scanned was 390 mL.

## 2016-11-17 NOTE — ED Notes (Signed)
IV team was able to get IV but unable to get blood draw enough for all orders.

## 2016-11-17 NOTE — ED Notes (Signed)
Condom cath placed on pt 

## 2016-11-17 NOTE — ED Provider Notes (Signed)
  Face-to-face evaluation   History: Is here for evaluation of weakness preventing him from being able to stand and ambulate per usual.  He does have bilateral lower leg amputations and uses prostheses.  He denies chest pain, headache or back pain.  Physical exam: Elderly alert cooperative.  Moderate right arm swelling.  Lungs clear anteriorly.   Medical screening examination/treatment/procedure(s) were conducted as a shared visit with non-physician practitioner(s) and myself.  I personally evaluated the patient during the encounter    Mancel BaleWentz, Crescent Gotham, MD 11/17/16 2245

## 2016-11-17 NOTE — ED Notes (Addendum)
Code sepsis called. Vascular will do study in room.

## 2016-11-17 NOTE — ED Notes (Signed)
PT TO CT

## 2016-11-17 NOTE — H&P (Signed)
TRH H&P   Patient Demographics:    Tony Mcclure, is a 83 y.o. male  MRN: [id number]   DOB - [birth year]  Admit Date - 11/17/2016  Outpatient Primary MD for the patient is [name], MD  Referring MD/NP/PA: Betsy Coder  Outpatient Specialists:  Verdis Prime (cardiologist)  Patient coming from: home  Chief Complaint  Patient presents with  . Code Sepsis    w/ edema      HPI:    Tony Mcclure  is a 83 y.o. male, w 83 year old male, lives at home, moves around with the help of a wheelchair, not on home oxygen, PMH of HTN, CAD, DM 2, stage IV chronic kidney disease, A. fib on Eliquis, b/l BKA, recent hospitalization 5/19-5/21 for new onset A. fib, symptomatic anemia (transfused 2 units PRBCs) and even more recently for pneumonia, apparently has been more confused over the past several weeks.  He has had right arm swelling for more than a month per his daughter Camelia Eng.    In the ED,  CT brain => no acute process.   Wbc 13.2, Hgb 8.9, Plt 121, pt noted to be tachycardic, and febrile.  Ua  + wbc TNTC,  Pt will be admitted for sepsis likely secondary from uti and severe hypokalemia (K=2.1)     Review of systems:    In addition to the HPI above, pt is a poor historian  No Headache, No changes with Vision or hearing, No problems swallowing food or Liquids, No Chest pain, Cough or Shortness of Breath, No Abdominal pain, No Nausea or Vommitting, Bowel movements are regular, No Blood in stool or Urine, No dysuria, No new skin rashes or bruises, No new joints pains-aches,  No new weakness, tingling, numbness in any extremity, No recent weight gain or loss, No polyuria, polydypsia or polyphagia, No significant Mental Stressors.  A full 10 point Review of Systems was done, except as stated above, all other Review of Systems were negative.   With Past History of the  following :    Past Medical History:  Diagnosis Date  . Acute diastolic heart failure (HCC)   . Atrial fibrillation with slow ventricular response (HCC) 07/28/2016  . BPH (benign prostatic hyperplasia)   . CAD (coronary artery disease)   . CKD (chronic kidney disease), stage III 07/26/2016  . CKD (chronic kidney disease), stage IV (HCC)   . Diabetes mellitus without complication (HCC)   . HLD (hyperlipidemia) 08/23/2016  . Hypertension   . Kidney disease   . Normocytic anemia 07/26/2016  . Paroxysmal A-fib (HCC) 07/28/2016  . S/P bilateral below knee amputation (HCC) 07/26/2016  . S/P bilateral BKA (below knee amputation) (HCC)   . Type 2 diabetes mellitus (HCC) 07/26/2016      Past Surgical History:  Procedure Laterality Date  . BELOW KNEE LEG AMPUTATION Bilateral   . CORONARY ARTERY BYPASS GRAFT    .  PROSTATE SURGERY    . SHOULDER SURGERY        Social History:     Social History  Substance Use Topics  . Smoking status: Former Smoker    Years: 20.00  . Smokeless tobacco: Never Used  . Alcohol use No     Lives - SNF   Mobility - unclear   Family History :     Family History  Problem Relation Age of Onset  . Heart attack Father       Home Medications:   Prior to Admission medications   Medication Sig Start Date End Date Taking? Authorizing Provider  allopurinol (ZYLOPRIM) 300 MG tablet Take 0.5 tablets (150 mg total) by mouth daily. Patient taking differently: Take 300 mg by mouth daily.  08/18/16  Yes Hongalgi, Maximino Greenland, MD  amLODipine (NORVASC) 10 MG tablet Take 10 mg by mouth daily.   Yes [provider]  apixaban (ELIQUIS) 2.5 MG TABS tablet Take 1 tablet (2.5 mg total) by mouth 2 (two) times daily. 07/28/16  Yes Calvert Cantor, MD  furosemide (LASIX) 20 MG tablet Take 20 mg by mouth every morning.   Yes [provider]  hydrALAZINE (APRESOLINE) 25 MG tablet Take 25 mg by mouth 2 (two) times daily.   Yes [provider]  insulin  aspart (NOVOLOG) 100 UNIT/ML FlexPen Inject 8 Units into the skin 2 (two) times daily. 11/11/16  Yes [provider]  Insulin Detemir (LEVEMIR FLEXTOUCH) 100 UNIT/ML Pen Inject 12-16 Units into the skin 2 (two) times daily. 11/11/16 11/11/17 Yes [provider]  metoprolol tartrate (LOPRESSOR) 25 MG tablet Take 1 tablet (25 mg total) by mouth 2 (two) times daily. 08/18/16  Yes Hongalgi, Maximino Greenland, MD  minoxidil (LONITEN) 2.5 MG tablet Take 2.5 mg by mouth 2 (two) times daily.   Yes [provider]  potassium chloride (K-DUR,KLOR-CON) 10 MEQ tablet Take 10 mEq by mouth daily.   Yes [provider]  simvastatin (ZOCOR) 40 MG tablet Take 40 mg by mouth every evening.   Yes [provider]  hydrALAZINE (APRESOLINE) 50 MG tablet Take 1 tablet (50 mg total) by mouth 3 (three) times daily. Patient not taking: Reported on 11/17/2016 08/18/16   Elease Etienne, MD  hydrocortisone (ANUSOL-HC) 2.5 % rectal cream Place rectally 2 (two) times daily as needed for hemorrhoids or itching. 08/18/16   Hongalgi, Maximino Greenland, MD  polyethylene glycol (MIRALAX / GLYCOLAX) packet Take 17 g by mouth daily. Patient not taking: Reported on 11/17/2016 08/18/16   Elease Etienne, MD  senna-docusate (SENOKOT-S) 8.6-50 MG tablet Take 1 tablet by mouth at bedtime. 08/18/16   Hongalgi, Maximino Greenland, MD     Allergies:    No Known Allergies   Physical Exam:   Vitals  Blood pressure (!) 167/86, pulse (!) 105, temperature (!) 101.4 F (38.6 C), temperature source Rectal, resp. rate (!) 30, height  (1.676 m), weight 86.6 kg (191 lb), SpO2 97 %.   1. General  lying in bed in NAD,    2. Normal affect and insight, Not Suicidal or Homicidal, Awake Alert, Oriented x 3  3. No F.N deficits, ALL C.Nerves Intact, Strength 5/5 all 4 extremities, Sensation intact all 4 extremities, Plantars down going.  4. Ears and Eyes appear Normal, Conjunctivae clear, PERRLA. Moist Oral Mucosa.  5. Supple Neck, No  JVD, No cervical lymphadenopathy appriciated, No Carotid Bruits.  6. Symmetrical Chest wall movement, Good air movement bilaterally, CTAB.  7. RRR,  No Gallops, Rubs or Murmurs, No Parasternal Heave.  8. Positive Bowel Sounds, Abdomen Soft, No tenderness, No organomegaly appriciated,No rebound -guarding or rigidity.  9.  No Cyanosis, No Skin Rash or Bruise. Right arm swollen  10. Good muscle tone,  joints appear normal , no effusions, Normal ROM.  11. No Palpable Lymph Nodes in Neck or Axillae  Foley catheter in place   + Bilateral BKA   Data Review:    CBC  Recent Labs Lab 11/17/16 1850  WBC 13.2*  HGB 8.9*  HCT 27.5*  PLT 121*  MCV 88.4  MCH 28.6  MCHC 32.4  RDW 17.5*  LYMPHSABS 1.3  MONOABS 1.6*  EOSABS 0.0  BASOSABS 0.0   ------------------------------------------------------------------------------------------------------------------  Chemistries   Recent Labs Lab 11/17/16 1850  NA 139  K 2.1*  CL 111  CO2 20*  GLUCOSE 245*  BUN 40*  CREATININE 1.87*  CALCIUM 7.2*  MG 1.8  AST 47*  ALT 25  ALKPHOS 68  BILITOT 0.7   ------------------------------------------------------------------------------------------------------------------ estimated creatinine clearance is 29.2 mL/min (A) (by C-G formula based on SCr of 1.87 mg/dL (H)). ------------------------------------------------------------------------------------------------------------------ No results for input(s): TSH, T4TOTAL, T3FREE, THYROIDAB in the last 72 hours.  Invalid input(s): FREET3  Coagulation profile No results for input(s): INR, PROTIME in the last 168 hours. ------------------------------------------------------------------------------------------------------------------- No results for input(s): DDIMER in the last 72 hours. -------------------------------------------------------------------------------------------------------------------  Cardiac Enzymes No results for  input(s): CKMB, TROPONINI, MYOGLOBIN in the last 168 hours.  Invalid input(s): CK ------------------------------------------------------------------------------------------------------------------    Component Value Date/Time   BNP 702.1 (H) 11/17/2016 1850     ---------------------------------------------------------------------------------------------------------------  Urinalysis    Component Value Date/Time   COLORURINE YELLOW 11/17/2016 1910   APPEARANCEUR CLOUDY (A) 11/17/2016 1910   LABSPEC 1.010 11/17/2016 1910   PHURINE 5.0 11/17/2016 1910   GLUCOSEU NEGATIVE 11/17/2016 1910   HGBUR LARGE (A) 11/17/2016 1910   BILIRUBINUR NEGATIVE 11/17/2016 1910   KETONESUR NEGATIVE 11/17/2016 1910   PROTEINUR 100 (A) 11/17/2016 1910   NITRITE NEGATIVE 11/17/2016 1910   LEUKOCYTESUR LARGE (A) 11/17/2016 1910    ----------------------------------------------------------------------------------------------------------------   Imaging Results:    Ct Head Wo Contrast  Result Date: 11/17/2016 CLINICAL DATA:  Slurred speech and right facial droop. EXAM: CT HEAD WITHOUT CONTRAST TECHNIQUE: Contiguous axial images were obtained from the base of the skull through the vertex without intravenous contrast. COMPARISON:  None. FINDINGS: Examination limited by patient motion and positioning. Brain: Age related cerebral atrophy, ventriculomegaly and periventricular white matter disease. No extra-axial fluid collections are identified. No CT findings for acute hemispheric infarction or intracranial hemorrhage. No mass lesions. The brainstem and cerebellum are normal. Vascular: No worrisome vascular calcifications or hyperdense vessels. Skull: No obvious skull fracture or bone lesion. Sinuses/Orbits: The paranasal sinuses and mastoid air cells are grossly clear. Lobes are grossly intact. Other: No obvious scalp lesion or hematoma. IMPRESSION: Very limited examination due to patient motion. Age related  cerebral atrophy, ventriculomegaly and periventricular white matter disease. No obvious acute intracranial process. Electronically Signed   By: Rudie MeyerP.  Gallerani M.D.   On: 11/17/2016 20:32   Dg Chest Portable 1 View  Result Date: 11/17/2016 CLINICAL DATA:  Sepsis. EXAM: PORTABLE CHEST 1 VIEW COMPARISON:  6 stent 1,018 FINDINGS: The heart is mildly enlarged but stable. Stable tortuosity, ectasia and calcification of the thoracic aorta. Stable eventration of the right hemidiaphragm. There is overlying right-sided pleural effusion and right lower lobe atelectasis or infiltrate. No left-sided effusion or infiltrate. IMPRESSION: Right-sided pleural effusion and overlying atelectasis  or infiltrate. Mild vascular congestion but no overt pulmonary edema. Electronically Signed   By: Rudie Meyer M.D.   On: 11/17/2016 20:04    Afib  At 100   Assessment & Plan:    Principal Problem:   Hypokalemia Active Problems:   CKD (chronic kidney disease), stage IV (HCC)   UTI (urinary tract infection)    Hypokalemia Replete Check cmp in am Check magnesium  Sepsis vanco iv , zosyn iv pharmacy to dose  Uti Await urine culture, blood culture See above abx  Abnormal lft Check ruq u/s Check acute hepatitis panel  CKD stage 4 Stable  DM2 Fsbs ac and qhs, ISS  Hypertension Cont metoprolol, cont hydralazine, cont norvasc  CHF (diastolic) Cont lasix, cont metoprolol  Pafib Cont eliquis, cont metoprolol  CAD s/p CABG Cont simvastatin, cont metoprolol  Gout Cont allopurinol     DVT Prophylaxis Eliquis,  SCDs  AM Labs Ordered, also please review Full Orders  Family Communication: Admission, patients condition and plan of care including tests being ordered have been discussed with the patient  who indicate understanding and agree with the plan and Code Status.  Code Status FULL CODE  Likely DC , TBD  Condition GUARDED    Consults called: none  Admission status: inpatient   Time  spent in minutes : 45 minutes    Pearson Grippe M.D on 11/17/2016 at 9:56 PM  Between 7am to 7pm - Pager - [phone]  . After 7pm go to [email or web address] - password [facility]  [facility] - Office  [phone]

## 2016-11-17 NOTE — ED Provider Notes (Signed)
[facility]EMER[facility]ENCY DEPT Provider Note   CSN: [id number] Arrival date & time: 11/17/16  1524     History   Chief Complaint Chief Complaint  Patient presents with  . Code Sepsis    w/ edema    HPI Tony Mcclure is a 83 y.o. male.  Level V caveat: AMS  [name] is a 82 y.o. Male with a history of CAD, CKD, DM, diastolic heart failure, and bilateral below the knee amputation, who presents to the ED via EMS with more confusion and weakness. Patient tells me he is feeling pretty good and nothing is bothering him right now. He does report right arm swelling for a while. When asked he does report feeling warm like he has a fever. Most of the history comes from his daughter Camelia Eng on the phone. She reports the patient has been somewhat more confused for several weeks now. She reports he's had more right arm swelling for about a month now. She is unsure if he is taking all his medications as prescribed. She reports he has gradually been more weak and not been able to walk. Several weeks ago he could walk with his walker and more recently he is requiring a wheelchair. No acute sudden onset of weakness or slurred speech. This seems to be more for gradual decline according to her. Patient was diagnosed with urinary tract infection about 2 weeks ago and completed a course of antibiotics. He is no longer on any antibiotics. She denies any recent known febrile illness. Patient denies chest pain, trouble breathing, coughing, abdominal pain, vomiting or headache.    The history is provided by the patient, medical records, a relative, a caregiver and the EMS personnel.  Weakness     Past Medical History:  Diagnosis Date  . Acute diastolic heart failure (HCC)   . Atrial fibrillation with slow ventricular response (HCC) 07/28/2016  . BPH (benign prostatic hyperplasia)   . CAD (coronary artery disease)   . CKD (chronic kidney disease), stage III 07/26/2016  . CKD (chronic kidney disease), stage IV  (HCC)   . Diabetes mellitus without complication (HCC)   . HLD (hyperlipidemia) 08/23/2016  . Hypertension   . Kidney disease   . Normocytic anemia 07/26/2016  . Paroxysmal A-fib (HCC) 07/28/2016  . S/P bilateral below knee amputation (HCC) 07/26/2016  . S/P bilateral BKA (below knee amputation) (HCC)   . Type 2 diabetes mellitus (HCC) 07/26/2016    Patient Active Problem List   Diagnosis Date Noted  . Acute respiratory failure with hypoxia (HCC) 08/23/2016  . Persistent atrial fibrillation (HCC) 08/23/2016  . Non-sustained ventricular tachycardia (HCC) 08/23/2016  . Situational depression 08/23/2016  . HLD (hyperlipidemia) 08/23/2016  . Palliative care by specialist   . DNR (do not resuscitate)   . Constipation   . Adult failure to thrive   . Acute diastolic heart failure (HCC)   . CKD (chronic kidney disease), stage IV (HCC)   . HCAP (healthcare-associated pneumonia) 08/12/2016  . Anemia of chronic disease 07/28/2016  . Paroxysmal A-fib (HCC) 07/28/2016  . Atrial fibrillation with slow ventricular response (HCC) 07/28/2016  . Normocytic anemia 07/26/2016  . CKD (chronic kidney disease), stage III 07/26/2016  . CAD (coronary artery disease) 07/26/2016  . Type 2 diabetes mellitus (HCC) 07/26/2016  . Essential hypertension 07/26/2016  . S/P bilateral below knee amputation (HCC) 07/26/2016    Past Surgical History:  Procedure Laterality Date  . BELOW KNEE LEG AMPUTATION Bilateral   . CORONARY ARTERY BYPASS GRAFT    .  PROSTATE SURGERY    . SHOULDER SURGERY         Home Medications    Prior to Admission medications   Medication Sig Start Date End Date Taking? Authorizing Provider  allopurinol (ZYLOPRIM) 300 MG tablet Take 0.5 tablets (150 mg total) by mouth daily. Patient taking differently: Take 300 mg by mouth daily.  08/18/16  Yes Hongalgi, Maximino Greenland, MD  amLODipine (NORVASC) 10 MG tablet Take 10 mg by mouth daily.   Yes [provider]  apixaban (ELIQUIS) 2.5  MG TABS tablet Take 1 tablet (2.5 mg total) by mouth 2 (two) times daily. 07/28/16  Yes Calvert Cantor, MD  furosemide (LASIX) 20 MG tablet Take 20 mg by mouth every morning.   Yes [provider]  hydrALAZINE (APRESOLINE) 25 MG tablet Take 25 mg by mouth 2 (two) times daily.   Yes [provider]  insulin aspart (NOVOLOG) 100 UNIT/ML FlexPen Inject 8 Units into the skin 2 (two) times daily. 11/11/16  Yes [provider]  Insulin Detemir (LEVEMIR FLEXTOUCH) 100 UNIT/ML Pen Inject 12-16 Units into the skin 2 (two) times daily. 11/11/16 11/11/17 Yes [provider]  metoprolol tartrate (LOPRESSOR) 25 MG tablet Take 1 tablet (25 mg total) by mouth 2 (two) times daily. 08/18/16  Yes Hongalgi, Maximino Greenland, MD  minoxidil (LONITEN) 2.5 MG tablet Take 2.5 mg by mouth 2 (two) times daily.   Yes [provider]  potassium chloride (K-DUR,KLOR-CON) 10 MEQ tablet Take 10 mEq by mouth daily.   Yes [provider]  simvastatin (ZOCOR) 40 MG tablet Take 40 mg by mouth every evening.   Yes [provider]  hydrALAZINE (APRESOLINE) 50 MG tablet Take 1 tablet (50 mg total) by mouth 3 (three) times daily. Patient not taking: Reported on 11/17/2016 08/18/16   Elease Etienne, MD  hydrocortisone (ANUSOL-HC) 2.5 % rectal cream Place rectally 2 (two) times daily as needed for hemorrhoids or itching. 08/18/16   Hongalgi, Maximino Greenland, MD  polyethylene glycol (MIRALAX / GLYCOLAX) packet Take 17 g by mouth daily. Patient not taking: Reported on 11/17/2016 08/18/16   Elease Etienne, MD  senna-docusate (SENOKOT-S) 8.6-50 MG tablet Take 1 tablet by mouth at bedtime. 08/18/16   Hongalgi, Maximino Greenland, MD    Family History Family History  Problem Relation Age of Onset  . Heart attack Father     Social History Social History  Substance Use Topics  . Smoking status: Former Smoker    Years: 20.00  . Smokeless tobacco: Never Used  . Alcohol use No     Allergies   Patient has no  known allergies.   Review of Systems Review of Systems  Unable to perform ROS: Mental status change  Neurological: Positive for weakness.     Physical Exam Updated Vital Signs BP (!) 167/86   Pulse (!) 105   Temp (!) 101.4 F (38.6 C) (Rectal)   Resp (!) 30   Ht  (1.676 m)   Wt 86.6 kg (191 lb)   SpO2 97%   BMI 30.83 kg/m   Physical Exam  Constitutional: He appears well-developed and well-nourished. No distress.  Warm to touch. Nontoxic-appearing.  HENT:  Head: Normocephalic and atraumatic.  Mucous membranes are dry.  Eyes: Pupils are equal, round, and reactive to light. Conjunctivae are normal. Right eye exhibits no discharge. Left eye exhibits no discharge.  Neck: Neck supple.  Cardiovascular: Normal rate, normal heart sounds and intact distal pulses.  Exam reveals no gallop  and no friction rub.   Irregular irregular rhythm. A. fib on the monitor. Heart rate in the 80s to 90s.  Pulmonary/Chest: Effort normal. No respiratory distress. He has no wheezes. He has no rales.  Lung sounds diminished his bilateral bases. Respirations around 22.  Abdominal: Soft. There is no tenderness. There is no guarding.  Genitourinary:  Genitourinary Comments: No GU rashes noted. Penis uncircumcised. Stage I appearing decubitus ulcers noted to his bottom and sacrum.  Musculoskeletal: He exhibits tenderness. He exhibits no edema.  Patient has pitting right upper extremity edema as well as bilateral pitting extremity edema down to his bilateral amputations. Patient is moving his bilateral upper extremities with good strength.   Lymphadenopathy:    He has no cervical adenopathy.  Neurological: He is alert. No sensory deficit. Coordination normal.  Patient is alert and oriented to person and place. Some confusion with recent events and can provide little details about how it's been happening recently. His speech is slightly slurred and sometimes hard to understand. Daughter reports this has  been like this for weeks. He is moving his upper extremities with good strength. No facial droop. He is very pleasant.   Skin: Skin is warm and dry. No rash noted. He is not diaphoretic. No erythema. No pallor.  Psychiatric: He has a normal mood and affect. His behavior is normal.  Nursing note and vitals reviewed.    ED Treatments / Results  Labs (all labs ordered are listed, but only abnormal results are displayed) Labs Reviewed  COMPREHENSIVE METABOLIC PANEL - Abnormal; Notable for the following:       Result Value   Potassium 2.1 (*)    CO2 20 (*)    Glucose, Bld 245 (*)    BUN 40 (*)    Creatinine, Ser 1.87 (*)    Calcium 7.2 (*)    Total Protein 4.9 (*)    Albumin 2.6 (*)    AST 47 (*)    GFR calc non Af Amer 31 (*)    GFR calc Af Amer 36 (*)    All other components within normal limits  CBC WITH DIFFERENTIAL/PLATELET - Abnormal; Notable for the following:    WBC 13.2 (*)    RBC 3.11 (*)    Hemoglobin 8.9 (*)    HCT 27.5 (*)    RDW 17.5 (*)    Platelets 121 (*)    Neutro Abs 10.2 (*)    Monocytes Absolute 1.6 (*)    All other components within normal limits  URINALYSIS, ROUTINE W REFLEX MICROSCOPIC - Abnormal; Notable for the following:    APPearance CLOUDY (*)    Hgb urine dipstick LARGE (*)    Protein, ur 100 (*)    Leukocytes, UA LARGE (*)    Bacteria, UA MANY (*)    Squamous Epithelial / LPF 0-5 (*)    All other components within normal limits  BRAIN NATRIURETIC PEPTIDE - Abnormal; Notable for the following:    B Natriuretic Peptide 702.1 (*)    All other components within normal limits  CULTURE, BLOOD (ROUTINE X 2)  CULTURE, BLOOD (ROUTINE X 2)  URINE CULTURE  MAGNESIUM  I-STAT CG4 LACTIC ACID, ED  I-STAT CG4 LACTIC ACID, ED    EKG  EKG Interpretation  Date/Time:  Monday November 17 2016 16:54:39 EDT Ventricular Rate:  103 PR Interval:    QRS Duration: 129 QT Interval:  380 QTC Calculation: 460 R Axis:   70 Text Interpretation:  Atrial  fibrillation Paired  ventricular premature complexes IVCD, consider atypical LBBB since last tracing no significant change Reconfirmed by Mancel Bale [phone]) on 11/17/2016 6:04:27 PM       Radiology Ct Head Wo Contrast  Result Date: 11/17/2016 CLINICAL DATA:  Slurred speech and right facial droop. EXAM: CT HEAD WITHOUT CONTRAST TECHNIQUE: Contiguous axial images were obtained from the base of the skull through the vertex without intravenous contrast. COMPARISON:  None. FINDINGS: Examination limited by patient motion and positioning. Brain: Age related cerebral atrophy, ventriculomegaly and periventricular white matter disease. No extra-axial fluid collections are identified. No CT findings for acute hemispheric infarction or intracranial hemorrhage. No mass lesions. The brainstem and cerebellum are normal. Vascular: No worrisome vascular calcifications or hyperdense vessels. Skull: No obvious skull fracture or bone lesion. Sinuses/Orbits: The paranasal sinuses and mastoid air cells are grossly clear. Lobes are grossly intact. Other: No obvious scalp lesion or hematoma. IMPRESSION: Very limited examination due to patient motion. Age related cerebral atrophy, ventriculomegaly and periventricular white matter disease. No obvious acute intracranial process. Electronically Signed   By: Rudie Meyer M.D.   On: 11/17/2016 20:32   Dg Chest Portable 1 View  Result Date: 11/17/2016 CLINICAL DATA:  Sepsis. EXAM: PORTABLE CHEST 1 VIEW COMPARISON:  6 stent 1,018 FINDINGS: The heart is mildly enlarged but stable. Stable tortuosity, ectasia and calcification of the thoracic aorta. Stable eventration of the right hemidiaphragm. There is overlying right-sided pleural effusion and right lower lobe atelectasis or infiltrate. No left-sided effusion or infiltrate. IMPRESSION: Right-sided pleural effusion and overlying atelectasis or infiltrate. Mild vascular congestion but no overt pulmonary edema. Electronically Signed    By: Rudie Meyer M.D.   On: 11/17/2016 20:04    Procedures Procedures (including critical care time)  Medications Ordered in ED Medications  potassium chloride 10 mEq in 100 mL IVPB (10 mEq Intravenous New Bag/Given 11/17/16 2029)  piperacillin-tazobactam (ZOSYN) IVPB 3.375 g (not administered)  vancomycin (VANCOCIN) IVPB 1000 mg/200 mL premix (not administered)  sodium chloride 0.9 % bolus 500 mL (0 mLs Intravenous Stopped 11/17/16 1844)  piperacillin-tazobactam (ZOSYN) IVPB 3.375 g (0 g Intravenous Stopped 11/17/16 1844)  vancomycin (VANCOCIN) 1,750 mg in sodium chloride 0.9 % 500 mL IVPB (0 mg Intravenous Stopped 11/17/16 2056)  magnesium sulfate IVPB 2 g 50 mL (2 g Intravenous New Bag/Given 11/17/16 2029)  potassium chloride SA (K-DUR,KLOR-CON) CR tablet 40 mEq (40 mEq Oral Given 11/17/16 2029)     Initial Impression / Assessment and Plan / ED Course  I have reviewed the triage vital signs and the nursing notes.  Pertinent labs & imaging results that were available during my care of the patient were reviewed by me and considered in my medical decision making (see chart for details).    This is a 83 y.o. Male with a history of CAD, CKD, DM, diastolic heart failure, and bilateral below the knee amputation, who presents to the ED via EMS with more confusion and weakness. Patient tells me he is feeling pretty good and nothing is bothering him right now. He does report right arm swelling for a while. When asked he does report feeling warm like he has a fever. Most of the history comes from his daughter Camelia Eng on the phone. She reports the patient has been somewhat more confused for several weeks now. She reports he's had more right arm swelling for about a month now. She is unsure if he is taking all his medications as prescribed. She reports he has gradually been more weak and  not been able to walk. Several weeks ago he could walk with his walker and more recently he is requiring a wheelchair. No  acute sudden onset of weakness or slurred speech. This seems to be more for gradual decline according to her. Patient was diagnosed with urinary tract infection about 2 weeks ago and completed a course of antibiotics. He is no longer on any antibiotics.   On exam the patient is non-toxic appearing. He has a rectal temp of 101.4 on arrival. With slight tachycardia on arrival will initiate code sepsis. Patient is alert and pleasant. He is oriented to person and place, but has some confusion with the date and recent events leading up to his ED visit. He has severe pitting right upper extremity edema. Concern for DVT will obtain ultrasound. He also has a diffuse peripheral edema that is not as severe as his right upper arm. Decubitus ulcer noted to his bilateral buttocks appears to be around stage I. He has some slight slurred speech on my exam but is moving all his extremities without difficulty. His bilateral below the knee amputations.  2 the patient's peripheral edema there is lots of difficulty obtaining IV access. In the meantime IV was obtained but no blood work was obtained. Antibiotics were initiated with unknown source for code sepsis. He was started on vancomycin and Zosyn. Blood work finally was obtained and lactic acid is normal. Urinalysis shows urinary tract infection with large leukocytes and too numerous to count white blood cells. Potassium returns critically low at 2.1. Will initiate 40 mEq of IV potassium over 4 hours as well as 40 of oral potassium. CBC reveals a slight leukocytosis with a white count of 13,200. Hemoglobin is around his baseline at 8.9. CT head was obtained but was limited due to patient motion. No obvious acute intracranial process. Chest x-ray shows right-sided pleural effusion and overlying atelectasis or infiltrate. Vascular congestion but no overt pulmonary edema. Patient already covered with vancomycin and Zosyn. Will admit for urinary tract infection, and  hypokalemia. Patient agrees with plan. His gradual onset of trouble walking might be related to his hypokalemia.  I consulted with Dr. Fayrene Fearing who accepted the patient for admission.   This patient was discussed with and evaluated by Dr. Effie Shy who agrees with assessment and plan.   Final Clinical Impressions(s) / ED Diagnoses   Final diagnoses:  Acute cystitis with hematuria  Altered mental status, unspecified altered mental status type  Edema of amputation stump of right upper extremity (HCC)  Hypokalemia  Decubitus ulcer of left buttock, stage 1  Decubitus ulcer of right buttock, stage 1    New Prescriptions New Prescriptions   No medications on file     Lorene Dy 11/17/16 2145    Mancel Bale, MD 11/17/16 2245

## 2016-11-17 NOTE — ED Notes (Addendum)
Patient arrived by EMS.  Patient soiled with feces and urine.  Adult brief looked like it had been on for days.  Patient was not clean at all.  Daughter stated that in home health comes to clean him several times a week and that he did not want to be cleaned this morning.  Stage 1 pressure ulcers seen on patient's buttocks.  All of patient's clothes (approved by patient) had to be thrown away due to them being filled with feces and urine.  Where patient had been wearing his glasses so long, an indented area on nose was observed.  Patient wears oxygen at home and a sore on patient's left cheek is observed from the tubing.

## 2016-11-17 NOTE — Progress Notes (Signed)
Pharmacy Antibiotic Note  Tony Mcclure is a 83 y.o. male admitted on 11/17/2016 with confusion and weakness concerning for sepsis.  Pharmacy has been consulted for Zosyn and Vancomycin dosing. SCr is 1.87 which is around baseline. nCrCl ~ 25 mL/min    Plan: -Zosyn 3.375 gm IV Q 8 hours -Vancomycin 1.75 gm IV once, then 1 gm IV Q 24 hours -Monitor CBC, renal fx, cultures and clinical progress  -VT at SS   Height: 5\' 6"  (167.6 cm) Weight: 191 lb (86.6 kg) IBW/kg (Calculated) : 63.8  Temp (24hrs), Avg:99.4 F (37.4 C), Min:97.4 F (36.3 C), Max:101.4 F (38.6 C)  No results for input(s): WBC, CREATININE, LATICACIDVEN, VANCOTROUGH, VANCOPEAK, VANCORANDOM, GENTTROUGH, GENTPEAK, GENTRANDOM, TOBRATROUGH, TOBRAPEAK, TOBRARND, AMIKACINPEAK, AMIKACINTROU, AMIKACIN in the last 168 hours.  CrCl cannot be calculated (Patient's most recent lab result is older than the maximum 21 days allowed.).    No Known Allergies  Antimicrobials this admission: Zosyn 9/10 >>  Vanc 9/10 >>   Dose adjustments this admission:   Microbiology results: 9/10 BCx:   Thank you for allowing pharmacy to be a part of this patient's care.  Vinnie LevelBenjamin Venissa Nappi, PharmD., BCPS Clinical Pharmacist Pager [phone]

## 2016-11-18 ENCOUNTER — Inpatient Hospital Stay (HOSPITAL_COMMUNITY): Payer: Medicare Other

## 2016-11-18 DIAGNOSIS — I10 Essential (primary) hypertension: Secondary | ICD-10-CM

## 2016-11-18 DIAGNOSIS — L89311 Pressure ulcer of right buttock, stage 1: Secondary | ICD-10-CM

## 2016-11-18 DIAGNOSIS — Z794 Long term (current) use of insulin: Secondary | ICD-10-CM

## 2016-11-18 DIAGNOSIS — L8990 Pressure ulcer of unspecified site, unspecified stage: Secondary | ICD-10-CM | POA: Insufficient documentation

## 2016-11-18 DIAGNOSIS — N183 Chronic kidney disease, stage 3 (moderate): Secondary | ICD-10-CM

## 2016-11-18 DIAGNOSIS — N3001 Acute cystitis with hematuria: Secondary | ICD-10-CM

## 2016-11-18 DIAGNOSIS — L89321 Pressure ulcer of left buttock, stage 1: Secondary | ICD-10-CM

## 2016-11-18 DIAGNOSIS — A419 Sepsis, unspecified organism: Secondary | ICD-10-CM

## 2016-11-18 DIAGNOSIS — Z89511 Acquired absence of right leg below knee: Secondary | ICD-10-CM

## 2016-11-18 DIAGNOSIS — N390 Urinary tract infection, site not specified: Secondary | ICD-10-CM

## 2016-11-18 DIAGNOSIS — E1122 Type 2 diabetes mellitus with diabetic chronic kidney disease: Secondary | ICD-10-CM

## 2016-11-18 DIAGNOSIS — I481 Persistent atrial fibrillation: Secondary | ICD-10-CM

## 2016-11-18 DIAGNOSIS — Z89512 Acquired absence of left leg below knee: Secondary | ICD-10-CM

## 2016-11-18 DIAGNOSIS — K7689 Other specified diseases of liver: Secondary | ICD-10-CM

## 2016-11-18 DIAGNOSIS — J9601 Acute respiratory failure with hypoxia: Secondary | ICD-10-CM

## 2016-11-18 HISTORY — DX: Sepsis, unspecified organism: A41.9

## 2016-11-18 LAB — COMPREHENSIVE METABOLIC PANEL
ALBUMIN: 2.7 g/dL — AB (ref 3.5–5.0)
ALK PHOS: 81 U/L (ref 38–126)
ALT: 33 U/L (ref 17–63)
ALT: 31 U/L (ref 17–63)
ANION GAP: 10 (ref 5–15)
ANION GAP: 8 (ref 5–15)
AST: 58 U/L — AB (ref 15–41)
AST: 49 U/L — ABNORMAL HIGH (ref 15–41)
Albumin: 2.5 g/dL — ABNORMAL LOW (ref 3.5–5.0)
Alkaline Phosphatase: 88 U/L (ref 38–126)
BUN: 44 mg/dL — AB (ref 6–20)
BUN: 45 mg/dL — ABNORMAL HIGH (ref 6–20)
CALCIUM: 8 mg/dL — AB (ref 8.9–10.3)
CHLORIDE: 113 mmol/L — AB (ref 101–111)
CO2: 21 mmol/L — ABNORMAL LOW (ref 22–32)
CO2: 22 mmol/L (ref 22–32)
Calcium: 7.9 mg/dL — ABNORMAL LOW (ref 8.9–10.3)
Chloride: 111 mmol/L (ref 101–111)
Creatinine, Ser: 2.04 mg/dL — ABNORMAL HIGH (ref 0.61–1.24)
Creatinine, Ser: 2.11 mg/dL — ABNORMAL HIGH (ref 0.61–1.24)
GFR calc Af Amer: 32 mL/min — ABNORMAL LOW (ref >60)
GFR calc non Af Amer: 27 mL/min — ABNORMAL LOW (ref >60)
GFR, EST AFRICAN AMERICAN: 31 mL/min — AB (ref >60)
GFR, EST NON AFRICAN AMERICAN: 28 mL/min — AB (ref >60)
Glucose, Bld: 203 mg/dL — ABNORMAL HIGH (ref 65–99)
Glucose, Bld: 213 mg/dL — ABNORMAL HIGH (ref 65–99)
POTASSIUM: 2.8 mmol/L — AB (ref 3.5–5.1)
POTASSIUM: 2.9 mmol/L — AB (ref 3.5–5.1)
SODIUM: 143 mmol/L (ref 135–145)
Sodium: 142 mmol/L (ref 135–145)
Total Bilirubin: 1 mg/dL (ref 0.3–1.2)
Total Bilirubin: 0.9 mg/dL (ref 0.3–1.2)
Total Protein: 5.4 g/dL — ABNORMAL LOW (ref 6.5–8.1)
Total Protein: 5.1 g/dL — ABNORMAL LOW (ref 6.5–8.1)

## 2016-11-18 LAB — MAGNESIUM: Magnesium: 2.6 mg/dL — ABNORMAL HIGH (ref 1.7–2.4)

## 2016-11-18 LAB — CBC
HEMATOCRIT: 29.2 % — AB (ref 39.0–52.0)
HEMOGLOBIN: 9.1 g/dL — AB (ref 13.0–17.0)
MCH: 28 pg (ref 26.0–34.0)
MCHC: 31.2 g/dL (ref 30.0–36.0)
MCV: 89.8 fL (ref 78.0–100.0)
Platelets: 133 10*3/uL — ABNORMAL LOW (ref 150–400)
RBC: 3.25 MIL/uL — ABNORMAL LOW (ref 4.22–5.81)
RDW: 17.3 % — AB (ref 11.5–15.5)
WBC: 14.2 10*3/uL — AB (ref 4.0–10.5)

## 2016-11-18 LAB — BASIC METABOLIC PANEL
ANION GAP: 10 (ref 5–15)
BUN: 48 mg/dL — ABNORMAL HIGH (ref 6–20)
CALCIUM: 7.9 mg/dL — AB (ref 8.9–10.3)
CO2: 20 mmol/L — AB (ref 22–32)
Chloride: 112 mmol/L — ABNORMAL HIGH (ref 101–111)
Creatinine, Ser: 2.1 mg/dL — ABNORMAL HIGH (ref 0.61–1.24)
GFR calc non Af Amer: 27 mL/min — ABNORMAL LOW (ref >60)
GFR, EST AFRICAN AMERICAN: 31 mL/min — AB (ref >60)
Glucose, Bld: 186 mg/dL — ABNORMAL HIGH (ref 65–99)
POTASSIUM: 3.1 mmol/L — AB (ref 3.5–5.1)
Sodium: 142 mmol/L (ref 135–145)

## 2016-11-18 LAB — GLUCOSE, CAPILLARY
GLUCOSE-CAPILLARY: 183 mg/dL — AB (ref 65–99)
GLUCOSE-CAPILLARY: 192 mg/dL — AB (ref 65–99)
Glucose-Capillary: 183 mg/dL — ABNORMAL HIGH (ref 65–99)
Glucose-Capillary: 220 mg/dL — ABNORMAL HIGH (ref 65–99)

## 2016-11-18 MED ORDER — POTASSIUM CHLORIDE 10 MEQ/100ML IV SOLN
10.0000 meq | INTRAVENOUS | Status: AC
  Administered 2016-11-18 (×4): 10 meq via INTRAVENOUS
  Filled 2016-11-18 (×4): qty 100

## 2016-11-18 MED ORDER — FUROSEMIDE 10 MG/ML IJ SOLN
40.0000 mg | Freq: Once | INTRAMUSCULAR | Status: AC
  Administered 2016-11-18: 40 mg via INTRAVENOUS
  Filled 2016-11-18: qty 4

## 2016-11-18 MED ORDER — ATORVASTATIN CALCIUM 20 MG PO TABS
20.0000 mg | ORAL_TABLET | Freq: Every day | ORAL | Status: DC
  Administered 2016-11-19 – 2016-11-25 (×6): 20 mg via ORAL
  Filled 2016-11-18 (×6): qty 1

## 2016-11-18 MED ORDER — MAGNESIUM SULFATE 2 GM/50ML IV SOLN
2.0000 g | Freq: Once | INTRAVENOUS | Status: AC
  Administered 2016-11-18: 2 g via INTRAVENOUS
  Filled 2016-11-18: qty 50

## 2016-11-18 MED ORDER — POTASSIUM CHLORIDE 10 MEQ/100ML IV SOLN
10.0000 meq | INTRAVENOUS | Status: AC
  Administered 2016-11-18 – 2016-11-19 (×4): 10 meq via INTRAVENOUS
  Filled 2016-11-18 (×4): qty 100

## 2016-11-18 MED ORDER — POTASSIUM CHLORIDE CRYS ER 20 MEQ PO TBCR: 40.0000 meq | EXTENDED_RELEASE_TABLET | ORAL | Status: AC

## 2016-11-18 MED ORDER — MAGNESIUM OXIDE 400 (241.3 MG) MG PO TABS
400.0000 mg | ORAL_TABLET | Freq: Two times a day (BID) | ORAL | Status: AC
Start: 2016-11-18 — End: 2016-11-19

## 2016-11-18 NOTE — [place] FL2 (Signed)
[place] MEDICAID FL2 LEVEL OF CARE SCREENING TOOL     IDENTIFICATION  Patient Name: Tony SearlesHerschel Mcclure Birthdate: Jul 27, 1928 Sex: male Admission Date (Current Location): 11/17/2016  [facility] [place]   Facility and Address:  [facility], [address], [place], [place] [address]      Provider Number: [id number]  Attending Physician Name and Address:  Calvert Cantorizwan, Saima, MD  Relative Name and Phone Number:  Camelia Engerri, daughter, [phone]    Current Level of Care: Hospital Recommended Level of Care: Skilled Nursing Facility Prior Approval Number:    Date Approved/Denied:   PASRR Number: [id number]  Discharge Plan: SNF    Current Diagnoses: Patient Active Problem List   Diagnosis Date Noted  . Sepsis secondary to UTI (HCC) 11/18/2016  . Pressure injury of skin 11/18/2016  . Hypokalemia 11/17/2016  . Acute respiratory failure with hypoxia (HCC) 08/23/2016  . Persistent atrial fibrillation (HCC) 08/23/2016  . Non-sustained ventricular tachycardia (HCC) 08/23/2016  . Situational depression 08/23/2016  . HLD (hyperlipidemia) 08/23/2016  . Palliative care by specialist   . DNR (do not resuscitate)   . Constipation   . Adult failure to thrive   . Acute diastolic heart failure (HCC)   . CKD (chronic kidney disease), stage IV (HCC)   . HCAP (healthcare-associated pneumonia) 08/12/2016  . Anemia of chronic disease 07/28/2016  . Paroxysmal A-fib (HCC) 07/28/2016  . Atrial fibrillation with slow ventricular response (HCC) 07/28/2016  . Normocytic anemia 07/26/2016  . CAD (coronary artery disease) 07/26/2016  . Type 2 diabetes mellitus (HCC) 07/26/2016  . Essential hypertension 07/26/2016  . S/P bilateral below knee amputation (HCC) 07/26/2016    Orientation RESPIRATION BLADDER Height & Weight     Self, Place  O2 (Nasal cannula 2L) Incontinent Weight: 94.2 kg (207 lb 9.6 oz) Height:  5\' 6"  (167.6 cm)  BEHAVIORAL SYMPTOMS/MOOD  NEUROLOGICAL BOWEL NUTRITION STATUS      Continent Diet (Please see DC Summary)  AMBULATORY STATUS COMMUNICATION OF NEEDS Skin   Extensive Assist Verbally PU Stage and Appropriate Care (Pressure injury Stage II on buttocks)                       Personal Care Assistance Level of Assistance  Bathing, Feeding, Dressing Bathing Assistance: Maximum assistance Feeding assistance: Limited assistance Dressing Assistance: Maximum assistance     Functional Limitations Info             SPECIAL CARE FACTORS FREQUENCY  PT (By licensed PT)     PT Frequency: not yet assessed              Contractures      Additional Factors Info  Code Status, Allergies, Insulin Sliding Scale Code Status Info: DNR Allergies Info: NKA   Insulin Sliding Scale Info: 3x daily with meals and at bedtime       Current Medications (11/18/2016):  This is the current hospital active medication list Current Facility-Administered Medications  Medication Dose Route Frequency Provider Last Rate Last Dose  . 0.9 %  sodium chloride infusion  250 mL Intravenous PRN Pearson GrippeKim, James, MD      . acetaminophen (TYLENOL) tablet 650 mg  650 mg Oral Q6H PRN Pearson GrippeKim, James, MD       Or  . acetaminophen (TYLENOL) suppository 650 mg  650 mg Rectal Q6H PRN Pearson GrippeKim, James, MD      . allopurinol (ZYLOPRIM) tablet 300 mg  300 mg Oral Daily Pearson GrippeKim, James,  MD      . amLODipine (NORVASC) tablet 10 mg  10 mg Oral Daily Pearson Grippe, MD      . apixaban Everlene Balls) tablet 2.5 mg  2.5 mg Oral BID Pearson Grippe, MD   2.5 mg at 11/18/16 0028  . hydrALAZINE (APRESOLINE) tablet 25 mg  25 mg Oral BID Pearson Grippe, MD   25 mg at 11/18/16 0028  . insulin aspart (novoLOG) injection 0-5 Units  0-5 Units Subcutaneous QHS Pearson Grippe, MD   2 Units at 11/18/16 0029  . insulin aspart (novoLOG) injection 0-9 Units  0-9 Units Subcutaneous TID WC Pearson Grippe, MD   2 Units at 11/18/16 0914  . magnesium oxide (MAG-OX) tablet 400 mg  400 mg Oral BID Rizwan, Ladell Heads, MD       . magnesium sulfate IVPB 2 g 50 mL  2 g Intravenous Once Calvert Cantor, MD 50 mL/hr at 11/18/16 1114 2 g at 11/18/16 1114  . metoprolol tartrate (LOPRESSOR) tablet 25 mg  25 mg Oral BID Pearson Grippe, MD   25 mg at 11/18/16 0028  . minoxidil (LONITEN) tablet 2.5 mg  2.5 mg Oral BID Pearson Grippe, MD   2.5 mg at 11/18/16 0028  . piperacillin-tazobactam (ZOSYN) IVPB 3.375 g  3.375 g Intravenous Q8H Sampson Si, RPH   Stopped at 11/18/16 0431  . polyethylene glycol (MIRALAX / GLYCOLAX) packet 17 g  17 g Oral Daily Pearson Grippe, MD      . potassium chloride (K-DUR,KLOR-CON) CR tablet 10 mEq  10 mEq Oral Daily Pearson Grippe, MD      . potassium chloride 10 mEq in 100 mL IVPB  10 mEq Intravenous Q1 Hr x 4 Rizwan, Saima, MD 100 mL/hr at 11/18/16 1110 10 mEq at 11/18/16 1110  . potassium chloride SA (K-DUR,KLOR-CON) CR tablet 40 mEq  40 mEq Oral Q4H Rizwan, Ladell Heads, MD      . senna-docusate (Senokot-S) tablet 1 tablet  1 tablet Oral QHS Pearson Grippe, MD   1 tablet at 11/18/16 0028  . simvastatin (ZOCOR) tablet 40 mg  40 mg Oral QPM Pearson Grippe, MD   40 mg at 11/18/16 0028  . sodium chloride flush (NS) 0.9 % injection 3 mL  3 mL Intravenous Q12H Pearson Grippe, MD      . sodium chloride flush (NS) 0.9 % injection 3 mL  3 mL Intravenous PRN Pearson Grippe, MD         Discharge Medications: Please see discharge summary for a list of discharge medications.  Relevant Imaging Results:  Relevant Lab Results:   Additional Information SSN: [id number] [place], [place]

## 2016-11-18 NOTE — Clinical Social Work Note (Signed)
Clinical Social Work Assessment  Patient Details  Name: Tony Mcclure MRN: [id number] Date of Birth: Mar 05, 1929  Date of referral:  11/18/16               Reason for consult:  Facility Placement                Permission sought to share information with:  Facility Medical sales representativeContact Representative, Family Supports Permission granted to share information::  No  Name::     EcologistTerri  Agency::  SNFs  Relationship::  Daughter  Contact Information:  [phone]  Housing/Transportation Living arrangements for the past 2 months:  Single Family Home Source of Information:  Adult Children Patient Interpreter Needed:  None Criminal Activity/Legal Involvement Pertinent to Current Situation/Hospitalization:  No - Comment as needed Significant Relationships:  Adult Children Lives with:  Adult Children Do you feel safe going back to the place where you live?  No Need for family participation in patient care:  Yes (Comment)  Care giving concerns:  CSW received consult for possible SNF placement at time of discharge. CSW spoke with patient's daughter by request. Patient's daughter would like for patient to be discharged to [facility] for long term care. She is unable to handle patient at home anymore. Patient was at [facility] in June though July. CSW to continue to follow and assist with discharge planning needs.   Social Worker assessment / plan:  CSW spoke with patient's daughter concerning [facility] before [facility] care.  Employment status:  Retired Database administratornsurance information:  Managed Medicare PT Recommendations:  Not assessed at this time Information / Referral to community resources:  Skilled Nursing Facility  Patient/Family's Response to care:  Patient's daughter recognizes need for rehab if patient's Medicare is able to pay for any of it. She reports understanding that long term care will be an out of pocket cost and [place] Patient reported  preference for [facility] since patient has been there before.  Patient/Family's Understanding of and Emotional Response to Diagnosis, Current Treatment, and Prognosis:  Patient/family is realistic regarding therapy needs and expressed being hopeful for SNF placement. Patient's daughter expressed understanding of CSW role and discharge process as well as patient's medical condition. No questions/concerns about plan or treatment.    Emotional Assessment Appearance:  Appears stated age Attitude/Demeanor/Rapport:  Unable to Assess Affect (typically observed):  Unable to Assess Orientation:  Oriented to Self, Oriented to Place Alcohol / Substance use:  Not Applicable Psych involvement (Current and /or in the community):  No (Comment)  Discharge Needs  Concerns to be addressed:  Care Coordination Readmission within the last 30 days:  No Current discharge risk:  None Barriers to Discharge:  Continued Medical Work up   Tony Mcclure, LCSWA 11/18/2016, 2:51 PM

## 2016-11-18 NOTE — Progress Notes (Signed)
Dr. Butler Denmarkizwan was notified of pt episode of Vtach at 6:12am. CCMD reported 50 beat run of vtach.

## 2016-11-18 NOTE — Progress Notes (Signed)
Pt admitted from ED per stretcher accompanied by RN on arrival to the floor pt seem confuse but alert, has his prosthesis on, self introduced to pt, ID bracelet verrified, vital signs are stable fall risk assessment done, pt has stage two pressure ulcer at the sacrum skin discoloration at buttocks and bruise at his back,  Pink form dressing place at the sacrum, admission history not done due to pt being confused and no family member  around to provide history pt pulled his iv out and his appliances off, green mint placed on both hands, prescribed treatment stated will continue to monitor

## 2016-11-18 NOTE — Progress Notes (Addendum)
PROGRESS NOTE    Tony Mcclure   GEX:[id number]  DOB: 1928/05/17  DOA: 11/17/2016 PCP: [name], MD   Brief Narrative:  Tony Mcclure 83 year old male, lives at home, moves around with the help of a wheelchair, PMH of HTN, CAD w/p CABG, DM 2 on insulin, stage IV chronic kidney disease, chronic dCHF, A. fib on Eliquis, b/l BKA, wheelchair bound, Rt arm swelling for > 1 mo and external hemorrhoids. - last hospitalization: 6-4/ 6-11 with fever 101, bronchitis vs HCAP - hospitalized 5/19-5/21 for new onset A. fib, symptomatic anemia (transfused 2 units PRBCs).  Presents for weakness, unable to stand, slurred speech, right facial droop. Per daughter, confused for a few wks. Treated for a UTI about 2 wks ago and finished antibiotics. In ER: Fever 101.4, HR was A-fib > 100, RR 30, UTI with hematuria, 2 stage 1 sacral decubitus ulcers, hypokalemia with K of 2.9   Subjective: Called by RN for 50 beat V tach this AM. Of note, labs were pending. Patient is not waking up.   Assessment & Plan:   Principal Problem:   Sepsis secondary to UTI [facility]) - apparently was treated for a UTI about 2 wks ago - f/u Urine and blood cultures - cont Zosyn, d/c Vanc  Active Problems: Acute encephalopathy - current not awakening but was confused per history from daughter in ER - likely due to above- follow  Severe hypokalemia/ V tach - was given K over night but K only improved from 2.1 >> 2.8 - - replace aggressively- 6 runs of KCL IV, 40 + 40 PO (if he awakens) -  will recheck again today and order more K if needed - get Mag > 2.0 with IV Mag  Acute resp failure- - pulse ox on room air is in 80s and he is breathing quite heavily - grade not quantified on last ECHO in 5/18 - apparently extremities were quite swollen in ER- arms are swollen - his stump is weeping ? Exacerbation Diastolic CHF vs PNA - CXR from yesterday > no overt pulm edema but now shows right > left  Edema- he received a  500 cc bolus of fluids in ER and then 75 cc hr as he is not drinking -  in setting of hypokalemia and V tach, cannot be too aggressive with lasix- will give 40 mg IV now and follow urine output - cont Zosyn - repeat CXR tomorrow  Mildly elevated LFTs - may be due to sepsis- d/c ultrasound and follow levels  Persistent atrial fibrillation - Eliquis - Lopressor    CAD (coronary artery disease) - s/ p CABG    Type 2 diabetes mellitus   - Novolog sliding scale    Essential hypertension - Lopressor, Norvasc, Minoxidil, Hydralazine    S/P bilateral below knee amputation  - had prothesis    CKD (chronic kidney disease), stage IV     Pressure injury of skin - 2 grade 1 pressure ulcers  DVT prophylaxis: Eliquis Code Status: DNR Family Communication:  Disposition Plan:  Consultants:    Procedures:    Antimicrobials:  Anti-infectives    Start     Dose/Rate Route Frequency Ordered Stop   11/18/16 2000  vancomycin (VANCOCIN) IVPB 1000 mg/200 mL premix     1,000 mg 200 mL/hr over 60 Minutes Intravenous Every 24 hours 11/17/16 2124     11/18/16 0000  piperacillin-tazobactam (ZOSYN) IVPB 3.375 g     3.375 g 12.5 mL/hr over 240 Minutes Intravenous Every 8  hours 11/17/16 2124     11/17/16 1830  vancomycin (VANCOCIN) 1,750 mg in sodium chloride 0.9 % 500 mL IVPB     1,750 mg 250 mL/hr over 120 Minutes Intravenous  Once 11/17/16 1804 11/17/16 2056   11/17/16 1815  piperacillin-tazobactam (ZOSYN) IVPB 3.375 g     3.375 g 100 mL/hr over 30 Minutes Intravenous  Once 11/17/16 1802 11/17/16 1844   11/17/16 1815  vancomycin (VANCOCIN) IVPB 1000 mg/200 mL premix  Status:  Discontinued     1,000 mg 200 mL/hr over 60 Minutes Intravenous  Once 11/17/16 1802 11/17/16 1804       Objective: Vitals:   11/17/16 2200 11/17/16 2230 11/17/16 2245 11/18/16 0529  BP: (!) 164/68 (!) 153/55 134/67 (!) 129/57  Pulse: 95 (!) 103 99 89  Resp: (!) 34 (!) 33 (!) 32 (!) 38  Temp:    97.7 F (36.5  C)  TempSrc:    Oral  SpO2: 99% 99% 99% 96%  Weight:    94.2 kg (207 lb 9.6 oz)  Height:        Intake/Output Summary (Last 24 hours) at 11/18/16 1012 Last data filed at 11/18/16 0525  Gross per 24 hour  Intake             1670 ml  Output              375 ml  Net             1295 ml   Filed Weights   11/17/16 1722 11/18/16 0529  Weight: 86.6 kg (191 lb) 94.2 kg (207 lb 9.6 oz)    Examination: General exam: Appears comfortable - asleep HEENT: PERRLA, oral mucosa moist, no sclera icterus or thrush Respiratory system: Clear to auscultation. Respiratory effort normal. Cardiovascular system: S1 & S2 heard, RRR.  No murmurs  Gastrointestinal system: Abdomen soft, non-tender, nondistended. Normal bowel sound. No organomegaly Central nervous system: Alert and oriented. No focal neurological deficits. Extremities: No cyanosis, clubbing-  Edema in arms- legs not swollen but they are wet  Psychiatry:  Unable to assess    Data Reviewed: I have personally reviewed following labs and imaging studies  CBC:  Recent Labs Lab 11/17/16 1850 11/18/16 0738  WBC 13.2* 14.2*  NEUTROABS 10.2*  --   HGB 8.9* 9.1*  HCT 27.5* 29.2*  MCV 88.4 89.8  PLT 121* 133*   Basic Metabolic Panel:  Recent Labs Lab 11/17/16 1850 11/18/16 0738  NA 139 142  K 2.1* 2.8*  CL 111 111  CO2 20* 21*  GLUCOSE 245* 203*  BUN 40* 44*  CREATININE 1.87* 2.04*  CALCIUM 7.2* 7.9*  MG 1.8  --    GFR: Estimated Creatinine Clearance: 27.9 mL/min (A) (by C-G formula based on SCr of 2.04 mg/dL (H)). Liver Function Tests:  Recent Labs Lab 11/17/16 1850 11/18/16 0738  AST 47* 58*  ALT 25 33  ALKPHOS 68 88  BILITOT 0.7 1.0  PROT 4.9* 5.4*  ALBUMIN 2.6* 2.7*   No results for input(s): LIPASE, AMYLASE in the last 168 hours. No results for input(s): AMMONIA in the last 168 hours. Coagulation Profile: No results for input(s): INR, PROTIME in the last 168 hours. Cardiac Enzymes: No results for  input(s): CKTOTAL, CKMB, CKMBINDEX, TROPONINI in the last 168 hours. BNP (last 3 results) No results for input(s): PROBNP in the last 8760 hours. HbA1C: No results for input(s): HGBA1C in the last 72 hours. CBG:  Recent Labs Lab 11/17/16 2337 11/18/16 0753  GLUCAP 234* 183*   Lipid Profile: No results for input(s): CHOL, HDL, LDLCALC, TRIG, CHOLHDL, LDLDIRECT in the last 72 hours. Thyroid Function Tests: No results for input(s): TSH, T4TOTAL, FREET4, T3FREE, THYROIDAB in the last 72 hours. Anemia Panel: No results for input(s): VITAMINB12, FOLATE, FERRITIN, TIBC, IRON, RETICCTPCT in the last 72 hours. Urine analysis:    Component Value Date/Time   COLORURINE YELLOW 11/17/2016 1910   APPEARANCEUR CLOUDY (A) 11/17/2016 1910   LABSPEC 1.010 11/17/2016 1910   PHURINE 5.0 11/17/2016 1910   GLUCOSEU NEGATIVE 11/17/2016 1910   HGBUR LARGE (A) 11/17/2016 1910   BILIRUBINUR NEGATIVE 11/17/2016 1910   KETONESUR NEGATIVE 11/17/2016 1910   PROTEINUR 100 (A) 11/17/2016 1910   NITRITE NEGATIVE 11/17/2016 1910   LEUKOCYTESUR LARGE (A) 11/17/2016 1910   Sepsis Labs: @LABRCNTIP (procalcitonin:4,lacticidven:4) )No results found for this or any previous visit (from the past 240 hour(s)).       Radiology Studies: Ct Head Wo Contrast  Result Date: 11/17/2016 CLINICAL DATA:  Slurred speech and right facial droop. EXAM: CT HEAD WITHOUT CONTRAST TECHNIQUE: Contiguous axial images were obtained from the base of the skull through the vertex without intravenous contrast. COMPARISON:  None. FINDINGS: Examination limited by patient motion and positioning. Brain: Age related cerebral atrophy, ventriculomegaly and periventricular white matter disease. No extra-axial fluid collections are identified. No CT findings for acute hemispheric infarction or intracranial hemorrhage. No mass lesions. The brainstem and cerebellum are normal. Vascular: No worrisome vascular calcifications or hyperdense vessels.  Skull: No obvious skull fracture or bone lesion. Sinuses/Orbits: The paranasal sinuses and mastoid air cells are grossly clear. Lobes are grossly intact. Other: No obvious scalp lesion or hematoma. IMPRESSION: Very limited examination due to patient motion. Age related cerebral atrophy, ventriculomegaly and periventricular white matter disease. No obvious acute intracranial process. Electronically Signed   By: Rudie MeyerP.  Gallerani M.D.   On: 11/17/2016 20:32   Dg Chest Portable 1 View  Result Date: 11/17/2016 CLINICAL DATA:  Sepsis. EXAM: PORTABLE CHEST 1 VIEW COMPARISON:  6 stent 1,018 FINDINGS: The heart is mildly enlarged but stable. Stable tortuosity, ectasia and calcification of the thoracic aorta. Stable eventration of the right hemidiaphragm. There is overlying right-sided pleural effusion and right lower lobe atelectasis or infiltrate. No left-sided effusion or infiltrate. IMPRESSION: Right-sided pleural effusion and overlying atelectasis or infiltrate. Mild vascular congestion but no overt pulmonary edema. Electronically Signed   By: Rudie MeyerP.  Gallerani M.D.   On: 11/17/2016 20:04      Scheduled Meds: . allopurinol  300 mg Oral Daily  . amLODipine  10 mg Oral Daily  . apixaban  2.5 mg Oral BID  . hydrALAZINE  25 mg Oral BID  . insulin aspart  0-5 Units Subcutaneous QHS  . insulin aspart  0-9 Units Subcutaneous TID WC  . magnesium oxide  400 mg Oral BID  . metoprolol tartrate  25 mg Oral BID  . minoxidil  2.5 mg Oral BID  . polyethylene glycol  17 g Oral Daily  . potassium chloride  10 mEq Oral Daily  . potassium chloride  40 mEq Oral Q4H  . senna-docusate  1 tablet Oral QHS  . simvastatin  40 mg Oral QPM  . sodium chloride flush  3 mL Intravenous Q12H   Continuous Infusions: . sodium chloride    . magnesium sulfate 1 - 4 g bolus IVPB    . piperacillin-tazobactam (ZOSYN)  IV Stopped (11/18/16 0431)  . potassium chloride    . vancomycin  LOS: 1 day    Time spent in minutes:  35    Calvert Cantor, MD [facility] Pager: [email or web address] Password [email or web address] 11/18/2016, 10:12 AM

## 2016-11-18 NOTE — Progress Notes (Signed)
Inpatient Diabetes Program Recommendations  AACE/ADA: New Consensus Statement on Inpatient Glycemic Control (2015)  Target Ranges:  Prepandial:   less than 140 mg/dL      Peak postprandial:   less than 180 mg/dL (1-2 hours)      Critically ill patients:  140 - 180 mg/dL   Lab Results  Component Value Date   GLUCAP 220 (H) 11/18/2016   HGBA1C 7.4 (H) 08/12/2016    Review of Glycemic Control  Results for Tony SearlesCRANE, Kidus (MRN [id number]) as of 11/18/2016 13:06  Ref. Range 11/17/2016 23:37 11/18/2016 07:53 11/18/2016 12:15  Glucose-Capillary Latest Ref Range: 65 - 99 mg/dL 782234 (H) 956183 (H) 213220 (H)    Diabetes history: Type 2 Outpatient Diabetes medications: Novolog 8 units bid, Levemir 12-16 units bid Current orders for Inpatient glycemic control: Novolog 0-9 units tid, Novolog 0-5 units qhs  Inpatient Diabetes Program Recommendations: Consider Levemir 6 units bid.  Susette RacerJulie Dorlis Judice, RN, BA, MHA, CDE Diabetes Coordinator Inpatient Diabetes Program  [phone] (Team Pager) [phone] [facility] Office) 11/18/2016 1:14 PM

## 2016-11-19 ENCOUNTER — Inpatient Hospital Stay (HOSPITAL_COMMUNITY): Payer: Medicare Other

## 2016-11-19 DIAGNOSIS — R0902 Hypoxemia: Secondary | ICD-10-CM

## 2016-11-19 DIAGNOSIS — R4182 Altered mental status, unspecified: Secondary | ICD-10-CM

## 2016-11-19 DIAGNOSIS — T8789 Other complications of amputation stump: Secondary | ICD-10-CM

## 2016-11-19 LAB — CBC
HCT: 26.7 % — ABNORMAL LOW (ref 39.0–52.0)
Hemoglobin: 8.4 g/dL — ABNORMAL LOW (ref 13.0–17.0)
MCH: 27.7 pg (ref 26.0–34.0)
MCHC: 31.5 g/dL (ref 30.0–36.0)
MCV: 88.1 fL (ref 78.0–100.0)
PLATELETS: 120 10*3/uL — AB (ref 150–400)
RBC: 3.03 MIL/uL — AB (ref 4.22–5.81)
RDW: 17.4 % — AB (ref 11.5–15.5)
WBC: 8.2 10*3/uL (ref 4.0–10.5)

## 2016-11-19 LAB — BASIC METABOLIC PANEL
Anion gap: 2 — ABNORMAL LOW (ref 5–15)
BUN: 53 mg/dL — ABNORMAL HIGH (ref 6–20)
CALCIUM: 6.1 mg/dL — AB (ref 8.9–10.3)
CO2: 22 mmol/L (ref 22–32)
CREATININE: 2.1 mg/dL — AB (ref 0.61–1.24)
Chloride: 119 mmol/L — ABNORMAL HIGH (ref 101–111)
GFR, EST AFRICAN AMERICAN: 31 mL/min — AB (ref >60)
GFR, EST NON AFRICAN AMERICAN: 27 mL/min — AB (ref >60)
Glucose, Bld: 196 mg/dL — ABNORMAL HIGH (ref 65–99)
Potassium: 3.8 mmol/L (ref 3.5–5.1)
SODIUM: 143 mmol/L (ref 135–145)

## 2016-11-19 LAB — MAGNESIUM: MAGNESIUM: 2.4 mg/dL (ref 1.7–2.4)

## 2016-11-19 LAB — GLUCOSE, CAPILLARY
GLUCOSE-CAPILLARY: 172 mg/dL — AB (ref 65–99)
GLUCOSE-CAPILLARY: 163 mg/dL — AB (ref 65–99)
GLUCOSE-CAPILLARY: 227 mg/dL — AB (ref 65–99)
Glucose-Capillary: 162 mg/dL — ABNORMAL HIGH (ref 65–99)

## 2016-11-19 MED ORDER — POTASSIUM CHLORIDE 10 MEQ/100ML IV SOLN
INTRAVENOUS | Status: AC
  Administered 2016-11-19: 10 meq
  Filled 2016-11-19: qty 100

## 2016-11-19 MED ORDER — ORAL CARE MOUTH RINSE
15.0000 mL | Freq: Two times a day (BID) | OROMUCOSAL | Status: DC
  Administered 2016-11-19 – 2016-11-26 (×11): 15 mL via OROMUCOSAL

## 2016-11-19 MED ORDER — SODIUM CHLORIDE 0.9 % IV SOLN
1.0000 g | Freq: Once | INTRAVENOUS | Status: AC
  Administered 2016-11-19: 1 g via INTRAVENOUS
  Filled 2016-11-19: qty 10

## 2016-11-19 MED ORDER — FUROSEMIDE 10 MG/ML IJ SOLN
40.0000 mg | Freq: Every day | INTRAMUSCULAR | Status: DC
  Administered 2016-11-19 – 2016-11-22 (×4): 40 mg via INTRAVENOUS
  Filled 2016-11-19 (×4): qty 4

## 2016-11-19 MED ORDER — POTASSIUM CHLORIDE 10 MEQ/100ML IV SOLN
10.0000 meq | INTRAVENOUS | Status: AC
  Administered 2016-11-19 (×2): 10 meq via INTRAVENOUS
  Filled 2016-11-19: qty 100

## 2016-11-19 NOTE — Progress Notes (Addendum)
PROGRESS NOTE    Tony Mcclure  ZOX:[id number] DOB: 09-28-1928 DOA: 11/17/2016 PCP: [name], MD    Brief Narrative:  83 year old male who presented with sepsis. Patient is known to have hypertension, coronary artery disease, diabetes mellitus type 2, chronic kidney disease stage IV, atrial fibrillation on anticoagulation, status post bilateral BKA. Patient was noted to be more confused over last several weeks, positive right arm edema. Due to worsening symptoms he was referred to the hospital for further evaluation. On the initial physical examination, blood pressure 167/86, heart rate 105, temperature 101.4, respiratory rate 30, oxygen saturation 97%. Moist mucous membranes, lungs had decreased breath sounds at bases with bibasilar rales, decreased sounds at the right base, heart S1-S2 present and rhythmic, no gallops, rubs or murmurs, the abdomen was soft nontender, bilateral BKA, positive pitting edema, +++. Urinalysis with to numerous to count white cells. Chest x-ray rotated to the right side, right pleural effusion, increased lung markings bilaterally.  Patient was admitted to the medical unit with the working diagnosis of sepsis due to urinary tract infection, present on admission, complicated by both overload.  Assessment & Plan:   Principal Problem:   Sepsis secondary to UTI [facility]) Active Problems:   CAD (coronary artery disease)   Type 2 diabetes mellitus (HCC)   Essential hypertension   S/P bilateral below knee amputation (HCC)   CKD (chronic kidney disease), stage IV (HCC)   Persistent atrial fibrillation (HCC)   Hypokalemia   Pressure injury of skin   1. Sepsis due urinary tract infection, present on admission. Clinically patient more reactive, will continue antibiotic therapy with zosyn. Suspect primary source urine, doubt pneumonia, but can not rule our. Will continue to hold on IV fluids and continue diuresis as tolerated.   2. Hypoxia respiratory failure,  acute, pulmonary edema with right pleural effusion, decompensated diastolic heart failure, acute on chronic. Will continue diuresis with IV furosemide to target negative fluid balance, will continue oxymetry monitoring and supplemental 02, to target 02 saturation more than 92%, if no response to diuresis, patient may need thoracentesis, will hold on anticoagulation for now.   3. Paroxysmal atrial fibrillation. Will continue rate control, will hold on anticoagulation for now, will continue diuresis. Patient had episodes of pauses and non sustained VT. Will check MG and continue telemetry monitoring. Will check ekg.   4. Coronary disease status post CABG. Patient chest pain free, not on antiplatelet therapy.   5. Chronic kidney disease stage IV. Follow on renal function in am, avoid hypotension or nephrotoxic medications,   6. Type 2 diabetes mellitus. Continue insulin sliding scale for glucose cover and monitoring, capillary glucose   7. HTN. Continue blood pressure control with hydralazine, metoprolol, minoxidil.   Late entry: Telemetry personally reviewed noted prolonged episode of wide complex, polymorphic ventricular tachycardia, about 40 seconds. Patient with no symptoms, no chest pain or dyspnea, no change in mentation. Will check 12 lead ekg, magnesium 2,4, calcium gluconate given. Echocardiogram from 07/2016 with normal LV systolic function. It is likely to be aberrancy related to atrial fibrillation. Will request limited echocardiogram and if recurrent will consult cardiology.   DVT prophylaxis: epixaban  Code Status: dnr Family Communication:  Disposition Plan: snf   Consultants:     Procedures:     Antimicrobials:   Zosyn   Subjective: Patient on venti mask, no dyspnea, or chest pain, no nausea or vomiting, no abdominal pain.   Objective: Vitals:   11/18/16 0529 11/18/16 1200 11/18/16 2210 11/19/16 0631  BP: (!) 129/57 (!) 143/53 (!) 154/77 (!) 131/29  Pulse: 89 100  87 62  Resp: (!) 38 (!) 40 (!) 22 19  Temp: 97.7 F (36.5 C)  (!) 97.2 F (36.2 C) (!) 97.5 F (36.4 C)  TempSrc: Oral  Axillary Axillary  SpO2: 96% 92% 100% 97%  Weight: 94.2 kg (207 lb 9.6 oz)     Height:        Intake/Output Summary (Last 24 hours) at 11/19/16 1438 Last data filed at 11/19/16 0600  Gross per 24 hour  Intake              650 ml  Output              450 ml  Net              200 ml   Filed Weights   11/17/16 1722 11/18/16 0529  Weight: 86.6 kg (191 lb) 94.2 kg (207 lb 9.6 oz)    Examination:  General: deconditioned and ill looking appearing Neurology: Awake, positive confusion  E ENT: mild  pallor, no icterus, oral mucosa dry Cardiovascular: S1-S2 present, irregulary- irregular, no gallops, rubs, or murmurs. No jugular venous distention, pitting edema at the stumps +++.  Pulmonary: decreased breath sounds bilaterally, decreased air movement, no wheezing, rhonchi. Positive bibasilar rales with decreased breath sounds at the right base.  Gastrointestinal. Abdomen flat, no organomegaly, non tender, no rebound or guarding Skin. No rashes Musculoskeletal: no joint deformities     Data Reviewed: I have personally reviewed following labs and imaging studies  CBC:  Recent Labs Lab 11/17/16 1850 11/18/16 0738 11/19/16 0936  WBC 13.2* 14.2* 8.2  NEUTROABS 10.2*  --   --   HGB 8.9* 9.1* 8.4*  HCT 27.5* 29.2* 26.7*  MCV 88.4 89.8 88.1  PLT 121* 133* 120*   Basic Metabolic Panel:  Recent Labs Lab 11/17/16 1850 11/18/16 0738 11/18/16 1110 11/18/16 1646 11/19/16 0635  NA 139 142 143 142 143  K 2.1* 2.8* 2.9* 3.1* 3.8  CL 111 111 113* 112* 119*  CO2 20* 21* 22 20* 22  GLUCOSE 245* 203* 213* 186* 196*  BUN 40* 44* 45* 48* 53*  CREATININE 1.87* 2.04* 2.11* 2.10* 2.10*  CALCIUM 7.2* 7.9* 8.0* 7.9* 6.1*  MG 1.8 2.6*  --   --  2.4   GFR: Estimated Creatinine Clearance: 27.1 mL/min (A) (by C-G formula based on SCr of 2.1 mg/dL (H)). Liver Function  Tests:  Recent Labs Lab 11/17/16 1850 11/18/16 0738 11/18/16 1110  AST 47* 58* 49*  ALT 25 33 31  ALKPHOS 68 88 81  BILITOT 0.7 1.0 0.9  PROT 4.9* 5.4* 5.1*  ALBUMIN 2.6* 2.7* 2.5*   No results for input(s): LIPASE, AMYLASE in the last 168 hours. No results for input(s): AMMONIA in the last 168 hours. Coagulation Profile: No results for input(s): INR, PROTIME in the last 168 hours. Cardiac Enzymes: No results for input(s): CKTOTAL, CKMB, CKMBINDEX, TROPONINI in the last 168 hours. BNP (last 3 results) No results for input(s): PROBNP in the last 8760 hours. HbA1C: No results for input(s): HGBA1C in the last 72 hours. CBG:  Recent Labs Lab 11/18/16 1215 11/18/16 1647 11/18/16 2212 11/19/16 0750 11/19/16 1211  GLUCAP 220* 183* 192* 172* 162*   Lipid Profile: No results for input(s): CHOL, HDL, LDLCALC, TRIG, CHOLHDL, LDLDIRECT in the last 72 hours. Thyroid Function Tests: No results for input(s): TSH, T4TOTAL, FREET4, T3FREE, THYROIDAB in the last 72 hours. Anemia Panel:  No results for input(s): VITAMINB12, FOLATE, FERRITIN, TIBC, IRON, RETICCTPCT in the last 72 hours.    Radiology Studies: I have reviewed all of the imaging during this hospital visit personally     Scheduled Meds: . allopurinol  300 mg Oral Daily  . amLODipine  10 mg Oral Daily  . atorvastatin  20 mg Oral q1800  . furosemide  40 mg Intravenous Daily  . hydrALAZINE  25 mg Oral BID  . insulin aspart  0-5 Units Subcutaneous QHS  . insulin aspart  0-9 Units Subcutaneous TID WC  . metoprolol tartrate  25 mg Oral BID  . minoxidil  2.5 mg Oral BID  . polyethylene glycol  17 g Oral Daily  . senna-docusate  1 tablet Oral QHS  . sodium chloride flush  3 mL Intravenous Q12H   Continuous Infusions: . sodium chloride    . piperacillin-tazobactam (ZOSYN)  IV 3.375 g (11/19/16 1048)     LOS: 2 days        Joeli Fenner Annett Gula, MD [facility] Pager [phone]

## 2016-11-19 NOTE — Progress Notes (Signed)
Telemetry notified me of 41 second run of vtach.  Patient alert and asymptomatic. MD notified. EKG ordered and completed.

## 2016-11-19 NOTE — Progress Notes (Signed)
I saw and evaluated the patient, I personally obtain the key portions of the history and physical exam, I reviewed with the physician assistant documentation and agree with the physician assistant medical decision-making. Further assessment and plan are as follows:  Please see attendings daily progress note.  Mauricio Arrien M.D. PROGRESS NOTE  Shaheem Pichon ZOX:[id number] DOB: March 14, 1928 DOA: 11/17/2016 PCP: [name], MD   LOS: 2 days   Brief Narrative / Interim history: Patient is an 83 year old male with history significant for hypertension, CAD with CABG, type 2 DM, stage IV CKD, chronic diastolic HF, A.fib on eliquis, bilateral BKA, external hemorrhoids and right arm swelling for over 1 month.  He presented to the hospital for weakness, slurred speech, right facial droop and a few weeks of confusion.  He was treated for UTI about 2 weeks ago and finished his antibiotics. In ED he was febrile, in A.fib, tachypnic, hypokalemic, found to have two stage 1 decubitus ulcers, and UTI with hematuria.  Assessment & Plan: Principal Problem:   Sepsis secondary to UTI [facility]) Active Problems:   CAD (coronary artery disease)   Type 2 diabetes mellitus (HCC)   Essential hypertension   S/P bilateral below knee amputation (HCC)   CKD (chronic kidney disease), stage IV (HCC)   Persistent atrial fibrillation (HCC)   Hypokalemia   Pressure injury of skin  Sepsis secondary to UTI - WBC improved to 8.2 (was 14.2 yesterday) - Blood culture showed no growth in 24 hours - Urine culture pending - Continue empiric antimicrobial therapy with Zosyn IV  Acute Encephalopathy - Patient was arouseable today, oriented and able to answer questions - No family at bedside to ask if improved  Acute hypoxic respiratory failure - Due to pulmonary edema and right pleural effusion   Hypokalemia - potassum improved to 3.8  Hypocalcemia - Calcium dropped from 7.9 yesterday to 6.1 today (Calcium  corrected for albumin: 7.3) - Start calcium gluconate IV; continue to monitor  Type 2 Diabetes Mellitus - Continue Novolog sliding scale - Capillary blood glucose 172 and 162 last 2 times checked.  CKD, stage IV - Creatinine remains around 2.10 and BUN increased to 53 - GFR at 27 - Continue to monitor kidney function  Persistent A.fib - Continue Eliquis and Lopressor - Continue telemetry monitoring  Hypertension - stable on Lopressor, Norvasc, minoxidil, and hydralazine  Pressure Injuries to Skin - two grade 1 decubitus ulcers, continue to monitor  S/P Bilateral Below Knee Amputations - Has prosthesis - Uses wheelchair at home  DVT prophylaxis: Eliquis Code Status: DNR Family Communication: no family at bedside Disposition Plan: unknown at this time Consultants:    Procedures:    Antimicrobials:  Zosyn  Subjective: Patient reports that he is feeling okay at this time and has no current complaints. He denies fevers, nausea, vomiting, abdominal pain, and new onset or worsening edema.  He denies chest pain or difficulty breathing. He does report some coughing and chills at times.   Objective: Vitals:   11/18/16 0529 11/18/16 1200 11/18/16 2210 11/19/16 0631  BP: (!) 129/57 (!) 143/53 (!) 154/77 (!) 131/29  Pulse: 89 100 87 62  Resp: (!) 38 (!) 40 (!) 22 19  Temp: 97.7 F (36.5 C)  (!) 97.2 F (36.2 C) (!) 97.5 F (36.4 C)  TempSrc: Oral  Axillary Axillary  SpO2: 96% 92% 100% 97%  Weight: 94.2 kg (207 lb 9.6 oz)     Height:  Intake/Output Summary (Last 24 hours) at 11/19/16 1113 Last data filed at 11/19/16 0600  Gross per 24 hour  Intake              650 ml  Output              450 ml  Net              200 ml   Filed Weights   11/17/16 1722 11/18/16 0529  Weight: 86.6 kg (191 lb) 94.2 kg (207 lb 9.6 oz)    Examination:  Vitals:   11/18/16 0529 11/18/16 1200 11/18/16 2210 11/19/16 0631  BP: (!) 129/57 (!) 143/53 (!) 154/77 (!) 131/29    Pulse: 89 100 87 62  Resp: (!) 38 (!) 40 (!) 22 19  Temp: 97.7 F (36.5 C)  (!) 97.2 F (36.2 C) (!) 97.5 F (36.4 C)  TempSrc: Oral  Axillary Axillary  SpO2: 96% 92% 100% 97%  Weight: 94.2 kg (207 lb 9.6 oz)     Height:        Constitutional:  Eyes: Crusting seen on right eyelids; no erythema Respiratory: Clear to auscultation, no wheezing, rales or rhonchi;  Decreased breath sounds on right compared to left. O2 sat 97% with mask; Productive cough heard during exam Cardiovascular: Regular rate, irregular rhythm. No murmurs heard. Upper extremity pitting edema 3+ right arm, 2+ left arm; radial pulses intact bilaterally.   Abdomen: abdomen soft and non-distended; Bowel sounds present all 4 quadrants; no tenderness to palpation Psychiatric: Alert and oriented to person, place and year (but not date); easily arouseable and responsive to questions; pleasant affect.    Data Reviewed: I have independently reviewed following labs and imaging studies  CBC:  Recent Labs Lab 11/17/16 1850 11/18/16 0738 11/19/16 0936  WBC 13.2* 14.2* 8.2  NEUTROABS 10.2*  --   --   HGB 8.9* 9.1* 8.4*  HCT 27.5* 29.2* 26.7*  MCV 88.4 89.8 88.1  PLT 121* 133* 120*   Basic Metabolic Panel:  Recent Labs Lab 11/17/16 1850 11/18/16 0738 11/18/16 1110 11/18/16 1646 11/19/16 0635  NA 139 142 143 142 143  K 2.1* 2.8* 2.9* 3.1* 3.8  CL 111 111 113* 112* 119*  CO2 20* 21* 22 20* 22  GLUCOSE 245* 203* 213* 186* 196*  BUN 40* 44* 45* 48* 53*  CREATININE 1.87* 2.04* 2.11* 2.10* 2.10*  CALCIUM 7.2* 7.9* 8.0* 7.9* 6.1*  MG 1.8 2.6*  --   --  2.4   GFR: Estimated Creatinine Clearance: 27.1 mL/min (A) (by C-G formula based on SCr of 2.1 mg/dL (H)). Liver Function Tests:  Recent Labs Lab 11/17/16 1850 11/18/16 0738 11/18/16 1110  AST 47* 58* 49*  ALT 25 33 31  ALKPHOS 68 88 81  BILITOT 0.7 1.0 0.9  PROT 4.9* 5.4* 5.1*  ALBUMIN 2.6* 2.7* 2.5*   No results for input(s): LIPASE, AMYLASE in the  last 168 hours. No results for input(s): AMMONIA in the last 168 hours. Coagulation Profile: No results for input(s): INR, PROTIME in the last 168 hours. Cardiac Enzymes: No results for input(s): CKTOTAL, CKMB, CKMBINDEX, TROPONINI in the last 168 hours. BNP (last 3 results) No results for input(s): PROBNP in the last 8760 hours. HbA1C: No results for input(s): HGBA1C in the last 72 hours. CBG:  Recent Labs Lab 11/18/16 0753 11/18/16 1215 11/18/16 1647 11/18/16 2212 11/19/16 0750  GLUCAP 183* 220* 183* 192* 172*   Lipid Profile: No results for input(s): CHOL, HDL, LDLCALC, TRIG,  CHOLHDL, LDLDIRECT in the last 72 hours. Thyroid Function Tests: No results for input(s): TSH, T4TOTAL, FREET4, T3FREE, THYROIDAB in the last 72 hours. Anemia Panel: No results for input(s): VITAMINB12, FOLATE, FERRITIN, TIBC, IRON, RETICCTPCT in the last 72 hours. Urine analysis:    Component Value Date/Time   COLORURINE YELLOW 11/17/2016 1910   APPEARANCEUR CLOUDY (A) 11/17/2016 1910   LABSPEC 1.010 11/17/2016 1910   PHURINE 5.0 11/17/2016 1910   GLUCOSEU NEGATIVE 11/17/2016 1910   HGBUR LARGE (A) 11/17/2016 1910   BILIRUBINUR NEGATIVE 11/17/2016 1910   KETONESUR NEGATIVE 11/17/2016 1910   PROTEINUR 100 (A) 11/17/2016 1910   NITRITE NEGATIVE 11/17/2016 1910   LEUKOCYTESUR LARGE (A) 11/17/2016 1910   Sepsis Labs: Invalid input(s): PROCALCITONIN, LACTICIDVEN  Recent Results (from the past 240 hour(s))  Blood Culture (routine x 2)     Status: None (Preliminary result)   Collection Time: 11/17/16  5:08 PM  Result Value Ref Range Status   Specimen Description BLOOD BLOOD RIGHT FOREARM  Final   Special Requests IN PEDIATRIC BOTTLE Blood Culture adequate volume  Final   Culture NO GROWTH < 24 HOURS  Final   Report Status PENDING  Incomplete  Blood Culture (routine x 2)     Status: None (Preliminary result)   Collection Time: 11/17/16  5:55 PM  Result Value Ref Range Status   Specimen  Description BLOOD BLOOD LEFT FOREARM  Final   Special Requests IN PEDIATRIC BOTTLE Blood Culture adequate volume  Final   Culture NO GROWTH < 24 HOURS  Final   Report Status PENDING  Incomplete      Radiology Studies: Ct Head Wo Contrast  Result Date: 11/17/2016 CLINICAL DATA:  Slurred speech and right facial droop. EXAM: CT HEAD WITHOUT CONTRAST TECHNIQUE: Contiguous axial images were obtained from the base of the skull through the vertex without intravenous contrast. COMPARISON:  None. FINDINGS: Examination limited by patient motion and positioning. Brain: Age related cerebral atrophy, ventriculomegaly and periventricular white matter disease. No extra-axial fluid collections are identified. No CT findings for acute hemispheric infarction or intracranial hemorrhage. No mass lesions. The brainstem and cerebellum are normal. Vascular: No worrisome vascular calcifications or hyperdense vessels. Skull: No obvious skull fracture or bone lesion. Sinuses/Orbits: The paranasal sinuses and mastoid air cells are grossly clear. Lobes are grossly intact. Other: No obvious scalp lesion or hematoma. IMPRESSION: Very limited examination due to patient motion. Age related cerebral atrophy, ventriculomegaly and periventricular white matter disease. No obvious acute intracranial process. Electronically Signed   By: Rudie Meyer M.D.   On: 11/17/2016 20:32   Dg Chest Port 1 View  Result Date: 11/19/2016 CLINICAL DATA:  Shortness of breath. EXAM: PORTABLE CHEST 1 VIEW COMPARISON:  Radiograph of November 18, 2016. FINDINGS: Stable cardiomegaly. Status post coronary artery bypass graft. Aortic atherosclerosis. Stable central pulmonary vascular congestion is noted with bilateral diffuse interstitial densities concerning for pulmonary edema. Stable right basilar atelectasis or infiltrate is noted with associated pleural effusion. Bony thorax is unremarkable. IMPRESSION: Aortic atherosclerosis. Stable cardiomegaly and  central pulmonary vascular congestion with bilateral pulmonary edema. Stable right basilar atelectasis or infiltrate is noted with associated pleural effusion. Electronically Signed   By: Lupita Raider, M.D.   On: 11/19/2016 07:40   Dg Chest Port 1 View  Result Date: 11/18/2016 CLINICAL DATA:  Hypoxia EXAM: PORTABLE CHEST 1 VIEW COMPARISON:  11/17/2016 FINDINGS: Prior CABG. There is cardiomegaly with vascular congestion and bilateral airspace opacities, diffuse on the right and perihilar and lower lobe  on the left. This most likely reflects asymmetric edema. Possible small right effusion. IMPRESSION: Cardiomegaly with vascular congestion and asymmetric airspace disease, right greater than left, most likely asymmetric edema/ CHF. Possible small right effusion. Electronically Signed   By: Charlett Nose M.D.   On: 11/18/2016 11:04   Dg Chest Portable 1 View  Result Date: 11/17/2016 CLINICAL DATA:  Sepsis. EXAM: PORTABLE CHEST 1 VIEW COMPARISON:  6 stent 1,018 FINDINGS: The heart is mildly enlarged but stable. Stable tortuosity, ectasia and calcification of the thoracic aorta. Stable eventration of the right hemidiaphragm. There is overlying right-sided pleural effusion and right lower lobe atelectasis or infiltrate. No left-sided effusion or infiltrate. IMPRESSION: Right-sided pleural effusion and overlying atelectasis or infiltrate. Mild vascular congestion but no overt pulmonary edema. Electronically Signed   By: Rudie Meyer M.D.   On: 11/17/2016 20:04     Scheduled Meds: . allopurinol  300 mg Oral Daily  . amLODipine  10 mg Oral Daily  . apixaban  2.5 mg Oral BID  . atorvastatin  20 mg Oral q1800  . hydrALAZINE  25 mg Oral BID  . insulin aspart  0-5 Units Subcutaneous QHS  . insulin aspart  0-9 Units Subcutaneous TID WC  . metoprolol tartrate  25 mg Oral BID  . minoxidil  2.5 mg Oral BID  . polyethylene glycol  17 g Oral Daily  . potassium chloride  10 mEq Oral Daily  . senna-docusate  1  tablet Oral QHS  . sodium chloride flush  3 mL Intravenous Q12H   Continuous Infusions: . sodium chloride    . piperacillin-tazobactam (ZOSYN)  IV 3.375 g (11/19/16 1048)    Shelbie Proctor, PA-[facility] 11/19/2016, 11:13 AM

## 2016-11-19 NOTE — Progress Notes (Signed)
Pharmacy Antibiotic Note  Tony SearlesHerschel Mcclure is a 83 y.o. male admitted on 11/17/2016 with confusion and weakness concerning for sepsis. Pharmacy has been consulted for zosyn dosing.  Plan: - Continue zosyn 3.375 g IV Q 8 hours  - Monitor CBC, renal function, cultures, clinical progress  Height: 5\' 6"  (167.6 cm) Weight: 207 lb 9.6 oz (94.2 kg) IBW/kg (Calculated) : 63.8  Temp (24hrs), Avg:97.4 F (36.3 C), Min:97.2 F (36.2 C), Max:97.5 F (36.4 C)   Recent Labs Lab 11/17/16 1850 11/17/16 1915 11/18/16 0738 11/18/16 1110 11/18/16 1646 11/19/16 0635 11/19/16 0936  WBC 13.2*  --  14.2*  --   --   --  8.2  CREATININE 1.87*  --  2.04* 2.11* 2.10* 2.10*  --   LATICACIDVEN  --  1.34  --   --   --   --   --     Estimated Creatinine Clearance: 27.1 mL/min (A) (by C-G formula based on SCr of 2.1 mg/dL (H)).    No Known Allergies  Antimicrobials this admission: 9/10 vanc x1 9/10 zosyn >>   Dose adjustments this admission:   Microbiology results: 9/10 BCx: ngtd 9/10 UCx: sent    Thank you for allowing pharmacy to be a part of this patient's care.  Tony Mcclure 11/19/2016 11:00 AM

## 2016-11-19 NOTE — Progress Notes (Signed)
I text paged Sch[facility]. Orders was given to renew tele. However, no other orders given at this time. Physician made aware of pt having Vtach (8beats, 10 beats, 5 beats). Also informed that pt is not taking PO meds at this time.

## 2016-11-20 ENCOUNTER — Inpatient Hospital Stay (HOSPITAL_COMMUNITY): Payer: Medicare Other

## 2016-11-20 DIAGNOSIS — I5033 Acute on chronic diastolic (congestive) heart failure: Secondary | ICD-10-CM

## 2016-11-20 DIAGNOSIS — I340 Nonrheumatic mitral (valve) insufficiency: Secondary | ICD-10-CM

## 2016-11-20 DIAGNOSIS — I361 Nonrheumatic tricuspid (valve) insufficiency: Secondary | ICD-10-CM

## 2016-11-20 LAB — ECHOCARDIOGRAM LIMITED
Ao-asc: 28 cm
CHL CUP MV DEC (S): 218
CHL CUP PV REG GRAD DIAS: 5 mmHg
CHL CUP REG VEL DIAS: 115 cm/s
E decel time: 218 msec
FS: 37 % (ref 28–44)
Height: 66 in
IV/PV OW: 0.74
LA diam end sys: 60 mm
LADIAMINDEX: 2.84 cm/m2
LASIZE: 60 mm
LAVOL: 78 mL
LAVOLA4C: 71.5 mL
LAVOLIN: 37 mL/m2
LV PW d: 19 mm — AB (ref 0.6–1.1)
LVOT area: 3.14 cm2
LVOT diameter: 20 mm
MV pk E vel: 2.2 m/s
RV TAPSE: 22 mm
RV sys press: 62 mmHg
Reg peak vel: 343 cm/s
TR max vel: 343 cm/s
Weight: 3262.81 oz

## 2016-11-20 LAB — GLUCOSE, CAPILLARY
GLUCOSE-CAPILLARY: 197 mg/dL — AB (ref 65–99)
GLUCOSE-CAPILLARY: 240 mg/dL — AB (ref 65–99)
GLUCOSE-CAPILLARY: 207 mg/dL — AB (ref 65–99)
Glucose-Capillary: 184 mg/dL — ABNORMAL HIGH (ref 65–99)

## 2016-11-20 LAB — BASIC METABOLIC PANEL
ANION GAP: 9 (ref 5–15)
BUN: 51 mg/dL — AB (ref 6–20)
CO2: 23 mmol/L (ref 22–32)
Calcium: 7.8 mg/dL — ABNORMAL LOW (ref 8.9–10.3)
Chloride: 110 mmol/L (ref 101–111)
Creatinine, Ser: 1.95 mg/dL — ABNORMAL HIGH (ref 0.61–1.24)
GFR calc Af Amer: 34 mL/min — ABNORMAL LOW (ref >60)
GFR calc non Af Amer: 29 mL/min — ABNORMAL LOW (ref >60)
GLUCOSE: 205 mg/dL — AB (ref 65–99)
POTASSIUM: 3.3 mmol/L — AB (ref 3.5–5.1)
Sodium: 142 mmol/L (ref 135–145)

## 2016-11-20 LAB — CBC WITH DIFFERENTIAL/PLATELET
BASOS ABS: 0 10*3/uL (ref 0.0–0.1)
Basophils Relative: 0 %
Eosinophils Absolute: 0.1 10*3/uL (ref 0.0–0.7)
Eosinophils Relative: 1 %
HEMATOCRIT: 28.1 % — AB (ref 39.0–52.0)
Hemoglobin: 8.5 g/dL — ABNORMAL LOW (ref 13.0–17.0)
LYMPHS ABS: 1.1 10*3/uL (ref 0.7–4.0)
LYMPHS PCT: 14 %
MCH: 26.9 pg (ref 26.0–34.0)
MCHC: 30.2 g/dL (ref 30.0–36.0)
MCV: 88.9 fL (ref 78.0–100.0)
MONO ABS: 0.7 10*3/uL (ref 0.1–1.0)
Monocytes Relative: 8 %
NEUTROS ABS: 6.3 10*3/uL (ref 1.7–7.7)
Neutrophils Relative %: 77 %
Platelets: 142 10*3/uL — ABNORMAL LOW (ref 150–400)
RBC: 3.16 MIL/uL — AB (ref 4.22–5.81)
RDW: 17.1 % — AB (ref 11.5–15.5)
WBC: 8.2 10*3/uL (ref 4.0–10.5)

## 2016-11-20 MED ORDER — PNEUMOCOCCAL VAC POLYVALENT 25 MCG/0.5ML IJ INJ
0.5000 mL | INJECTION | INTRAMUSCULAR | Status: DC
  Filled 2016-11-20: qty 0.5

## 2016-11-20 MED ORDER — POTASSIUM CHLORIDE CRYS ER 20 MEQ PO TBCR
20.0000 meq | EXTENDED_RELEASE_TABLET | Freq: Once | ORAL | Status: AC
  Administered 2016-11-20: 20 meq via ORAL
  Filled 2016-11-20: qty 1

## 2016-11-20 MED ORDER — INFLUENZA VAC SPLIT HIGH-DOSE 0.5 ML IM SUSY
0.5000 mL | PREFILLED_SYRINGE | INTRAMUSCULAR | Status: DC
  Filled 2016-11-20: qty 0.5

## 2016-11-20 MED ORDER — APIXABAN 2.5 MG PO TABS
2.5000 mg | ORAL_TABLET | Freq: Two times a day (BID) | ORAL | Status: DC
  Administered 2016-11-20 (×2): 2.5 mg via ORAL
  Filled 2016-11-20 (×2): qty 1

## 2016-11-20 MED ORDER — DEXTROSE 5 % IV SOLN
1.0000 g | INTRAVENOUS | Status: AC
  Administered 2016-11-20 – 2016-11-24 (×5): 1 g via INTRAVENOUS
  Filled 2016-11-20 (×5): qty 10

## 2016-11-20 MED ORDER — ADULT MULTIVITAMIN W/MINERALS CH
1.0000 | ORAL_TABLET | Freq: Every day | ORAL | Status: DC
  Administered 2016-11-20 – 2016-11-26 (×6): 1 via ORAL
  Filled 2016-11-20 (×6): qty 1

## 2016-11-20 MED ORDER — GLUCERNA SHAKE PO LIQD
237.0000 mL | Freq: Three times a day (TID) | ORAL | Status: DC
  Administered 2016-11-20 – 2016-11-26 (×12): 237 mL via ORAL
  Filled 2016-11-20: qty 237

## 2016-11-20 NOTE — Progress Notes (Signed)
Patient had 18 beat run Vtach, asymptomatic. Paged on call doctor. Will continue to monitor.

## 2016-11-20 NOTE — Progress Notes (Signed)
PROGRESS NOTE    Tony Mcclure  WUJ:[id number] DOB: 1928-08-03 DOA: 11/17/2016 PCP: [name], MD    Brief Narrative:  83 year old male who presented with sepsis. Patient is known to have hypertension, coronary artery disease, diabetes mellitus type 2, chronic kidney disease stage IV, atrial fibrillation on anticoagulation, status post bilateral BKA. Patient was noted to be more confused over last several weeks, positive right arm edema. Due to worsening symptoms he was referred to the hospital for further evaluation. On the initial physical examination, blood pressure 167/86, heart rate 105, temperature 101.4, respiratory rate 30, oxygen saturation 97%. Moist mucous membranes, lungs had decreased breath sounds at bases with bibasilar rales, decreased sounds at the right base, heart S1-S2 present and rhythmic, no gallops, rubs or murmurs, the abdomen was soft nontender, bilateral BKA, positive pitting edema, +++. Urinalysis with to numerous to count white cells. Chest x-ray rotated to the right side, right pleural effusion, increased lung markings bilaterally.  Patient was admitted to the medical unit with the working diagnosis of sepsis due to urinary tract infection, present on admission, complicated by both overload.   Assessment & Plan:   Principal Problem:   Sepsis secondary to UTI [facility]) Active Problems:   CAD (coronary artery disease)   Type 2 diabetes mellitus (HCC)   Essential hypertension   S/P bilateral below knee amputation (HCC)   CKD (chronic kidney disease), stage IV (HCC)   Persistent atrial fibrillation (HCC)   Hypokalemia   Pressure injury of skin   1. Sepsis due urinary tract infection, present on admission. Improved mentation, will continue medical supportive care, will change antibiotic therapy to ceftriaxone, and will continue to follow cell count, temperature curve and cell count. Patient has been afebrile, cell count at 8,2. Will continue restrictive IV  fluids strategy.   2. Hypoxia respiratory failure, acute, pulmonary edema with right pleural effusion, decompensated diastolic heart failure, acute on chronic. Patient responding well to diuresis, will continue IV furosemide to target a negative fluid balance, follow chest film personally reviewed noted improvement of infiltrates and right effusion, likely will not need thoracentesis, will resume anticoagulation for a fib.   3. Paroxysmal atrial fibrillation. Rate controlled, with heart rate 50 to 76. No further wide complex tachycardia, will follow on echocardiography, improved serum ca, will continue K repletion with caution. Serum mag at 2.4 yesterday, will check in am. Continue metoprolol at 25 mg po bid.   4. Coronary disease status post CABG. No chest pain, holding antiplatelet therapy, patient on full anticoagulation with apixaban.   5. Chronic kidney disease stage IV. Stable renal function with serum cr at 1,95 from 2.10, with serum K at 3,3 and serum bicarbonate at 23. Will continue diuresis with furosemide IV, follow on renal function in am. Urine output 1400 cc over last 24 hours.   6. Type 2 diabetes mellitus. Tolerating po well will continue with insulin sliding scale for glucose cover and monitoring, capillary glucose 162, 163, 227, 197, 184.   7. HTN. Blood pressure control with hydralazine, metoprolol, minoxidil.    DVT prophylaxis: epixaban  Code Status: dnr Family Communication:  Disposition Plan: snf   Consultants:     Procedures:     Antimicrobials:   Zosyn dc 09/13  Ceftriaxone    Subjective: Patient more awake and reactive, able to wean down supplemental oxygen to nasal cannula, no dyspnea or chest pain, no palpitations, had asymptomatic episodes of wide complex tachycardia yesterday.   Objective: Vitals:   11/19/16 1559 11/19/16  2157 11/19/16 2204 11/20/16 0554  BP: (!) 136/52 (!) 108/30 (!) 110/50 (!) 111/33  Pulse: 87 (!) 58 (!) 58 74    Resp: Temp: 97.8 F (36.6 C) 97.7 F (36.5 C)  97.6 F (36.4 C)  TempSrc: Oral Oral  Oral  SpO2: 97% 94%  98%  Weight:    92.5 kg (203 lb 14.8 oz)  Height:        Intake/Output Summary (Last 24 hours) at 11/20/16 0938 Last data filed at 11/20/16 0554  Gross per 24 hour  Intake               50 ml  Output             1400 ml  Net            -1350 ml   Filed Weights   11/17/16 1722 11/18/16 0529 11/20/16 0554  Weight: 86.6 kg (191 lb) 94.2 kg (207 lb 9.6 oz) 92.5 kg (203 lb 14.8 oz)    Examination:  General: Not in pain or dyspnea. Deconditioned. Neurology: Awake and alert, non focal  E ENT: mild pallor, no icterus, oral mucosa moist Cardiovascular: S1-S2 present, irregulary-irregualr, no gallops, rubs, or murmurs. No jugular venous distention, pitting edem at the stumps.  Pulmonary: decreased breath sounds bilaterally at bases, more right than left, adequate air movement, no wheezing, rhonchi, scattered rales. Gastrointestinal. Abdomen flat, no organomegaly, non tender, no rebound or guarding Skin. No rashes Musculoskeletal: no joint deformities     Data Reviewed: I have personally reviewed following labs and imaging studies  CBC:  Recent Labs Lab 11/17/16 1850 11/18/16 0738 11/19/16 0936 11/20/16 0432  WBC 13.2* 14.2* 8.2 8.2  NEUTROABS 10.2*  --   --  6.3  HGB 8.9* 9.1* 8.4* 8.5*  HCT 27.5* 29.2* 26.7* 28.1*  MCV 88.4 89.8 88.1 88.9  PLT 121* 133* 120* 142*   Basic Metabolic Panel:  Recent Labs Lab 11/17/16 1850 11/18/16 0738 11/18/16 1110 11/18/16 1646 11/19/16 0635 11/20/16 0432  NA 139 142 143 142 143 142  K 2.1* 2.8* 2.9* 3.1* 3.8 3.3*  CL 111 111 113* 112* 119* 110  CO2 20* 21* 22 20* 22 23  GLUCOSE 245* 203* 213* 186* 196* 205*  BUN 40* 44* 45* 48* 53* 51*  CREATININE 1.87* 2.04* 2.11* 2.10* 2.10* 1.95*  CALCIUM 7.2* 7.9* 8.0* 7.9* 6.1* 7.8*  MG 1.8 2.6*  --   --  2.4  --    GFR: Estimated Creatinine Clearance: 29 mL/min  (A) (by C-G formula based on SCr of 1.95 mg/dL (H)). Liver Function Tests:  Recent Labs Lab 11/17/16 1850 11/18/16 0738 11/18/16 1110  AST 47* 58* 49*  ALT 25 33 31  ALKPHOS 68 88 81  BILITOT 0.7 1.0 0.9  PROT 4.9* 5.4* 5.1*  ALBUMIN 2.6* 2.7* 2.5*   No results for input(s): LIPASE, AMYLASE in the last 168 hours. No results for input(s): AMMONIA in the last 168 hours. Coagulation Profile: No results for input(s): INR, PROTIME in the last 168 hours. Cardiac Enzymes: No results for input(s): CKTOTAL, CKMB, CKMBINDEX, TROPONINI in the last 168 hours. BNP (last 3 results) No results for input(s): PROBNP in the last 8760 hours. HbA1C: No results for input(s): HGBA1C in the last 72 hours. CBG:  Recent Labs Lab 11/19/16 0750 11/19/16 1211 11/19/16 1647 11/19/16 2158 11/20/16 0744  GLUCAP 172* 162* 163* 227* 197*   Lipid Profile: No results for input(s): CHOL, HDL, LDLCALC,  TRIG, CHOLHDL, LDLDIRECT in the last 72 hours. Thyroid Function Tests: No results for input(s): TSH, T4TOTAL, FREET4, T3FREE, THYROIDAB in the last 72 hours. Anemia Panel: No results for input(s): VITAMINB12, FOLATE, FERRITIN, TIBC, IRON, RETICCTPCT in the last 72 hours.    Radiology Studies: I have reviewed all of the imaging during this hospital visit personally     Scheduled Meds: . allopurinol  300 mg Oral Daily  . amLODipine  10 mg Oral Daily  . atorvastatin  20 mg Oral q1800  . furosemide  40 mg Intravenous Daily  . hydrALAZINE  25 mg Oral BID  . insulin aspart  0-5 Units Subcutaneous QHS  . insulin aspart  0-9 Units Subcutaneous TID WC  . mouth rinse  15 mL Mouth Rinse BID  . metoprolol tartrate  25 mg Oral BID  . minoxidil  2.5 mg Oral BID  . polyethylene glycol  17 g Oral Daily  . senna-docusate  1 tablet Oral QHS  . sodium chloride flush  3 mL Intravenous Q12H   Continuous Infusions: . sodium chloride    . piperacillin-tazobactam (ZOSYN)  IV Stopped (11/20/16 0525)     LOS:  3 days        Mauricio Annett Gulaaniel Arrien, MD [facility] Pager [phone]

## 2016-11-20 NOTE — Progress Notes (Signed)
Initial Nutrition Assessment  DOCUMENTATION CODES:   Obesity unspecified  INTERVENTION:   - Ensure Enlive TID provides 350 kcal and 20 grams of protein per 8 oz bottle  -Multivitamin with minerals  NUTRITION DIAGNOSIS:   Increased nutrient needs related to wound healing as evidenced by estimated needs.  GOAL:   Patient will meet greater than or equal to 90% of their needs  MONITOR:   PO intake, Labs, Weight trends, I & O's  REASON FOR ASSESSMENT:   Low Braden    ASSESSMENT:   Pt is a 83 year old male who lives at home and is wheelchair bound. PMH of HTN, CAD, DM, CKD stage IV, A. Fib on Eliquis, bilateral BKA, and anemia. Pt presented with sepsis and weakness.   Pt reports a poor appetite since hospital admission but that he generally has a good appetite. Pt eats 1-2 meals a day at home, cooked by his daughter. She cooks meals such as Actuarymeatloaf and tacos. Pt reports that he doesn't know if he has lost weight but that he usually weighs around 190 lbs. Per chart he weighs 203 lbs. Pt reports no chewing/swallowing difficulties and that he consumes one Ensure per day at home. Talked to pt about importance of consuming protein to assist with wound healing.     Labs: CBG 197 (H), Cr 1.95 (H)  Medications: Reviewed.   Diet Order:  Diet heart healthy/carb modified Room service appropriate? Yes; Fluid consistency: Thin  Skin:  Wound (see comment) (stage II buttocks)  Last BM:  11/19/16  Height:   Ht Readings from Last 1 Encounters:  11/17/16 5\' 6"  (1.676 m)    Weight:   Wt Readings from Last 1 Encounters:  11/20/16 203 lb 14.8 oz (92.5 kg)    Ideal Body Weight:  68 kg  BMI:  Body mass index is 32.91 kg/m.  Estimated Nutritional Needs:   Kcal:  1700-1900 kcal per day  Protein:  95-120 grams of protein  Fluid:  1.7-1.9 L  EDUCATION NEEDS:   Education needs addressed  Wynetta EmeryGr[facility] Dietetic Intern Pager: [phone] 11/20/2016 12:18 PM

## 2016-11-20 NOTE — Progress Notes (Signed)
  Echocardiogram 2D Echocardiogram has been performed.  Dorena Dewiffany G Indiya Izquierdo 11/20/2016, 9:23 AM

## 2016-11-21 ENCOUNTER — Inpatient Hospital Stay (HOSPITAL_COMMUNITY): Payer: Medicare Other

## 2016-11-21 DIAGNOSIS — L89154 Pressure ulcer of sacral region, stage 4: Secondary | ICD-10-CM

## 2016-11-21 HISTORY — PX: IR THORACENTESIS ASP PLEURAL SPACE W/IMG GUIDE: IMG5380

## 2016-11-21 LAB — PROTEIN, PLEURAL OR PERITONEAL FLUID: Total protein, fluid: <3 g/dL

## 2016-11-21 LAB — COMPREHENSIVE METABOLIC PANEL
ALT: 39 U/L (ref 17–63)
AST: 26 U/L (ref 15–41)
Albumin: 2.4 g/dL — ABNORMAL LOW (ref 3.5–5.0)
Alkaline Phosphatase: 91 U/L (ref 38–126)
Anion gap: 9 (ref 5–15)
BILIRUBIN TOTAL: 0.9 mg/dL (ref 0.3–1.2)
BUN: 51 mg/dL — ABNORMAL HIGH (ref 6–20)
CO2: 25 mmol/L (ref 22–32)
CREATININE: 2.11 mg/dL — AB (ref 0.61–1.24)
Calcium: 7.8 mg/dL — ABNORMAL LOW (ref 8.9–10.3)
Chloride: 106 mmol/L (ref 101–111)
GFR calc Af Amer: 31 mL/min — ABNORMAL LOW (ref >60)
GFR calc non Af Amer: 27 mL/min — ABNORMAL LOW (ref >60)
Glucose, Bld: 242 mg/dL — ABNORMAL HIGH (ref 65–99)
Potassium: 3.1 mmol/L — ABNORMAL LOW (ref 3.5–5.1)
Sodium: 140 mmol/L (ref 135–145)
TOTAL PROTEIN: 5.2 g/dL — AB (ref 6.5–8.1)

## 2016-11-21 LAB — CBC WITH DIFFERENTIAL/PLATELET
BASOS ABS: 0 10*3/uL (ref 0.0–0.1)
Basophils Relative: 0 %
Eosinophils Absolute: 0 10*3/uL (ref 0.0–0.7)
Eosinophils Relative: 0 %
HEMATOCRIT: 27.9 % — AB (ref 39.0–52.0)
Hemoglobin: 8.6 g/dL — ABNORMAL LOW (ref 13.0–17.0)
LYMPHS ABS: 1.2 10*3/uL (ref 0.7–4.0)
LYMPHS PCT: 17 %
MCH: 27.7 pg (ref 26.0–34.0)
MCHC: 30.8 g/dL (ref 30.0–36.0)
MCV: 89.7 fL (ref 78.0–100.0)
Monocytes Absolute: 0.7 10*3/uL (ref 0.1–1.0)
Monocytes Relative: 9 %
NEUTROS ABS: 5.4 10*3/uL (ref 1.7–7.7)
Neutrophils Relative %: 74 %
Platelets: 161 10*3/uL (ref 150–400)
RBC: 3.11 MIL/uL — AB (ref 4.22–5.81)
RDW: 17 % — ABNORMAL HIGH (ref 11.5–15.5)
WBC: 7.2 10*3/uL (ref 4.0–10.5)

## 2016-11-21 LAB — BODY FLUID CELL COUNT WITH DIFFERENTIAL
Eos, Fluid: 0 %
LYMPHS FL: 37 %
Monocyte-Macrophage-Serous Fluid: 13 % — ABNORMAL LOW (ref 50–90)
NEUTROPHIL FLUID: 50 % — AB (ref 0–25)
WBC FLUID: 133 uL (ref 0–1000)

## 2016-11-21 LAB — GLUCOSE, CAPILLARY
GLUCOSE-CAPILLARY: 224 mg/dL — AB (ref 65–99)
GLUCOSE-CAPILLARY: 222 mg/dL — AB (ref 65–99)
GLUCOSE-CAPILLARY: 223 mg/dL — AB (ref 65–99)
Glucose-Capillary: 254 mg/dL — ABNORMAL HIGH (ref 65–99)

## 2016-11-21 LAB — GRAM STAIN

## 2016-11-21 LAB — LACTATE DEHYDROGENASE, PLEURAL OR PERITONEAL FLUID: LD, Fluid: 49 U/L — ABNORMAL HIGH (ref 3–23)

## 2016-11-21 LAB — MAGNESIUM: Magnesium: 2.2 mg/dL (ref 1.7–2.4)

## 2016-11-21 MED ORDER — COLLAGENASE 250 UNIT/GM EX OINT
TOPICAL_OINTMENT | Freq: Every day | CUTANEOUS | Status: DC
  Administered 2016-11-21 – 2016-11-26 (×5): via TOPICAL
  Filled 2016-11-21: qty 30

## 2016-11-21 MED ORDER — POTASSIUM CHLORIDE CRYS ER 20 MEQ PO TBCR
40.0000 meq | EXTENDED_RELEASE_TABLET | Freq: Once | ORAL | Status: AC
  Administered 2016-11-21: 40 meq via ORAL
  Filled 2016-11-21: qty 2

## 2016-11-21 MED ORDER — LIDOCAINE HCL (PF) 1 % IJ SOLN
INTRAMUSCULAR | Status: AC
  Filled 2016-11-21: qty 30

## 2016-11-21 MED ORDER — FUROSEMIDE 10 MG/ML IJ SOLN
20.0000 mg | Freq: Once | INTRAMUSCULAR | Status: AC
  Administered 2016-11-21: 20 mg via INTRAVENOUS
  Filled 2016-11-21: qty 2

## 2016-11-21 MED ORDER — IPRATROPIUM-ALBUTEROL 0.5-2.5 (3) MG/3ML IN SOLN
3.0000 mL | Freq: Three times a day (TID) | RESPIRATORY_TRACT | Status: DC
  Administered 2016-11-21 – 2016-11-23 (×6): 3 mL via RESPIRATORY_TRACT
  Filled 2016-11-21 (×7): qty 3

## 2016-11-21 MED ORDER — ALBUTEROL SULFATE (2.5 MG/3ML) 0.083% IN NEBU
2.5000 mg | INHALATION_SOLUTION | RESPIRATORY_TRACT | Status: DC | PRN
  Administered 2016-11-23: 2.5 mg via RESPIRATORY_TRACT
  Filled 2016-11-21: qty 3

## 2016-11-21 MED ORDER — ALBUTEROL SULFATE (2.5 MG/3ML) 0.083% IN NEBU
INHALATION_SOLUTION | RESPIRATORY_TRACT | Status: AC
  Administered 2016-11-21: 05:00:00
  Filled 2016-11-21: qty 3

## 2016-11-21 NOTE — Progress Notes (Signed)
Pt resting at this time, respiratory rate still increased 24-28 RR/min. New orders placed by NP on call, will continue to monitor pt.

## 2016-11-21 NOTE — Progress Notes (Signed)
Bladder scan completed, indicates 738 ml in bladder. NP on call notified.

## 2016-11-21 NOTE — Procedures (Signed)
Ultrasound-guided diagnostic and therapeutic right thoracentesis performed yielding 1.2 liters of serous colored fluid. No immediate complications. Follow-up chest x-ray pending.       Breken Nazari E 3:28 PM 11/21/2016

## 2016-11-21 NOTE — Progress Notes (Signed)
Pt brought to radiology for thoracentesis, Sat 91% on 6L on arrival.  Placed on left side for thora sat down to 82% prior to stick. Non rebreather placed and sat up to 100%. Thoracentesis completed, 1.2 L removed serous fluid.  Placed back on back.  Sat remains 100%.  PCXR ordered

## 2016-11-21 NOTE — Progress Notes (Addendum)
Called to patient's room for increased work of breathing. On arrival, patient was slightly tachypneic (28/min), SpO2 on 3L was 97%, mild accessory muscle usage. Lung auscultation revealed coarse crackles in the bases, diminished air movement throughout. Patient was given an Albuterol breathing treatment, and rapid response arrived at bedside. No further respiratory orders received at this time. RT will continue to monitor.

## 2016-11-21 NOTE — Progress Notes (Signed)
In and out catheterization performed at bedside 5[facility] 09 by Harrie Foreman, RN, Christean Grief present as a second Charity fundraiser. Indication acute urinary retention, sterile techniques fallowed, pt tolerated procedure well. Output 800 ml.

## 2016-11-21 NOTE — Progress Notes (Addendum)
PROGRESS NOTE    Tony Mcclure  ZOX:[id number] DOB: [birth year] DOA: 11/17/2016 PCP: [name], MD    Brief Narrative:  83 year old male who presented with sepsis. Patient is known to have hypertension, coronary artery disease, diabetes mellitus type 2, chronic kidney disease stage IV, atrial fibrillation on anticoagulation, status post bilateral BKA. Patient was noted to be more confused over last several weeks, positive right arm edema. Due to worsening symptoms he was referred to the hospital for further evaluation. On the initial physical examination, blood pressure 167/86, heart rate 105, temperature 101.4, respiratory rate 30, oxygen saturation 97%. Moist mucous membranes, lungs had decreased breath sounds at bases with bibasilar rales, decreased sounds at the right base, heart S1-S2 present and rhythmic, no gallops, rubs or murmurs, the abdomen was soft nontender, bilateral BKA, positive pitting edema, +++. Urinalysis with to numerous to count white cells. Chest x-ray rotated to the right side, right pleural effusion, increased lung markings bilaterally.  Patient was admitted to the medical unit with the working diagnosis of sepsis due to urinary tract infection, present on admission, complicated by both overload.   Assessment & Plan:   Principal Problem:   Sepsis secondary to UTI [facility]) Active Problems:   CAD (coronary artery disease)   Type 2 diabetes mellitus (HCC)   Essential hypertension   S/P bilateral below knee amputation (HCC)   CKD (chronic kidney disease), stage IV (HCC)   Persistent atrial fibrillation (HCC)   Hypokalemia   Pressure injury of skin  1. Sepsis due urinary tract infection, present on admission. Stable mentation, patient awake and able to respond to simple questions, continue antibiotic therapy with ceftriaxone. Continue to hold on IV fluids.   2. Worsening acute hypoxic respiratory failure, due to pulmonary edema with right pleural effusion,  decompensated diastolic heart failure, acute on chronic.  Rapid response called due to respiratory distress, had extra dose of furosemide, follow up chest film personally reviewed noted increased lung markings and worsening right pleural effusion, will continue diuresis with furosemide 40 mg IV, and will request paracentesis, likely transudate pleural effusion. Check plural fluid protein, LDH, along with cell count, gram stain and culture. Continue oxymetry monitoring and supplemental 02 per Stark.   3. Paroxysmal atrial fibrillation. Increased heart rate from mid 70 to mid 80's. Will continue telemetry monitoring, will keep K close to 4 and Mg at 2. No further VT reported. Limited echocardiogram with normal LV systolic function, increase wall thickness, in LVH pattern.   4. Coronary disease status post CABG. Anticoagulation with apixaban, will hold due to plan procedure. Reported hemoptysis.   5. Chronic kidney disease stage IV.  Renal function with serum cr at 1,95 from 2.10, will replete K with caution, Mg at 2,2, will follow renal panel in am. Will continue diuresis with furosemide. Urine output over last 24 hours, 1200 ml.   6. Type 2 diabetes mellitus. Continue insulin sliding scale for glucose cover and monitoring, capillary glucose 184, 249, 207, 224, 254. Will hold on long acting insulin, until patient more stable.   7. HTN. Blood pressure control with hydralazine, metoprolol, minoxidil, systolic blood pressure 120's.   8. Large decubitus ulcer, present on admission. 15 cm x 9 cm, with black scar, not able to stage, will consult nutrition and surgical consult, when improves from respiratory status.    DVT prophylaxis:epixaban Code Status:dnr Family Communication: Disposition Plan:snf   Consultants:    Procedures:    Antimicrobials:   Zosyn dc 09/13  Ceftriaxone  Subjective: Patient had respiratory distress this am, required additional diuresis with  furosemide, follow up chest film with pulmonary edema and worsening right pleural effusion. This am patient denies dyspnea, but looks dyspneic, no chest pain, no nausea or vomiting. Questionable accuracy of patient's history.   Objective: Vitals:   11/20/16 2326 11/21/16 0424 11/21/16 0600 11/21/16 0830  BP: (!) 121/59 (!) 125/57  (!) 150/66  Pulse:  89  85  Resp: (!) 30 (!) 32 (!) 26   Temp: 98.7 F (37.1 C) 97.6 F (36.4 C)    TempSrc: Oral Oral    SpO2: 94% 95%    Weight:      Height:        Intake/Output Summary (Last 24 hours) at 11/21/16 1231 Last data filed at 11/21/16 0520  Gross per 24 hour  Intake               50 ml  Output             1200 ml  Net            -1150 ml   Filed Weights   11/17/16 1722 11/18/16 0529 11/20/16 0554  Weight: 86.6 kg (191 lb) 94.2 kg (207 lb 9.6 oz) 92.5 kg (203 lb 14.8 oz)    Examination:  General: deconditioned and dyspneic Neurology: Awake  E ENT: positive pallor, no icterus, oral mucosa dry.  Cardiovascular: No JVD. S1-S2 present, irregulary irregular, no gallops, rubs, or murmurs. No jugular venous distention, bilateral stumps with + pitting edema. Pulmonary: decreased breath sounds at the right base, decreased air movement, no wheezing, but scattered rhonchi and rales. Gastrointestinal. Abdomen with mild distention, no organomegaly, non tender, no rebound or guarding Skin. No rashes Musculoskeletal: no joint deformities. Bilateral BKA.      Data Reviewed: I have personally reviewed following labs and imaging studies  CBC:  Recent Labs Lab 11/17/16 1850 11/18/16 0738 11/19/16 0936 11/20/16 0432 11/21/16 0734  WBC 13.2* 14.2* 8.2 8.2 7.2  NEUTROABS 10.2*  --   --  6.3 5.4  HGB 8.9* 9.1* 8.4* 8.5* 8.6*  HCT 27.5* 29.2* 26.7* 28.1* 27.9*  MCV 88.4 89.8 88.1 88.9 89.7  PLT 121* 133* 120* 142* 161   Basic Metabolic Panel:  Recent Labs Lab 11/17/16 1850 11/18/16 0738 11/18/16 1110 11/18/16 1646 11/19/16 0635  11/20/16 0432 11/21/16 0734  NA 139 142 143 142 143 142 140  K 2.1* 2.8* 2.9* 3.1* 3.8 3.3* 3.1*  CL 111 111 113* 112* 119* 110 106  CO2 20* 21* 22 20* GLUCOSE 245* 203* 213* 186* 196* 205* 242*  BUN 40* 44* 45* 48* 53* 51* 51*  CREATININE 1.87* 2.04* 2.11* 2.10* 2.10* 1.95* 2.11*  CALCIUM 7.2* 7.9* 8.0* 7.9* 6.1* 7.8* 7.8*  MG 1.8 2.6*  --   --  2.4  --  2.2   GFR: Estimated Creatinine Clearance: 26.8 mL/min (A) (by C-G formula based on SCr of 2.11 mg/dL (H)). Liver Function Tests:  Recent Labs Lab 11/17/16 1850 11/18/16 0738 11/18/16 1110 11/21/16 0734  AST 47* 58* 49* 26  ALT 25 33 31 39  ALKPHOS 68 88 81 91  BILITOT 0.7 1.0 0.9 0.9  PROT 4.9* 5.4* 5.1* 5.2*  ALBUMIN 2.6* 2.7* 2.5* 2.4*   No results for input(s): LIPASE, AMYLASE in the last 168 hours. No results for input(s): AMMONIA in the last 168 hours. Coagulation Profile: No results for input(s): INR, PROTIME in the last 168 hours. Cardiac  Enzymes: No results for input(s): CKTOTAL, CKMB, CKMBINDEX, TROPONINI in the last 168 hours. BNP (last 3 results) No results for input(s): PROBNP in the last 8760 hours. HbA1C: No results for input(s): HGBA1C in the last 72 hours. CBG:  Recent Labs Lab 11/20/16 0744 11/20/16 1153 11/20/16 1651 11/20/16 2122 11/21/16 0737  GLUCAP 197* 184* 240* 207* 224*   Lipid Profile: No results for input(s): CHOL, HDL, LDLCALC, TRIG, CHOLHDL, LDLDIRECT in the last 72 hours. Thyroid Function Tests: No results for input(s): TSH, T4TOTAL, FREET4, T3FREE, THYROIDAB in the last 72 hours. Anemia Panel: No results for input(s): VITAMINB12, FOLATE, FERRITIN, TIBC, IRON, RETICCTPCT in the last 72 hours.    Radiology Studies: I have reviewed all of the imaging during this hospital visit personally     Scheduled Meds: . allopurinol  300 mg Oral Daily  . amLODipine  10 mg Oral Daily  . atorvastatin  20 mg Oral q1800  . collagenase   Topical Daily  . feeding supplement  (GLUCERNA SHAKE)  237 mL Oral TID BM  . furosemide  40 mg Intravenous Daily  . hydrALAZINE  25 mg Oral BID  . insulin aspart  0-5 Units Subcutaneous QHS  . insulin aspart  0-9 Units Subcutaneous TID WC  . ipratropium-albuterol  3 mL Nebulization TID  . mouth rinse  15 mL Mouth Rinse BID  . metoprolol tartrate  25 mg Oral BID  . minoxidil  2.5 mg Oral BID  . multivitamin with minerals  1 tablet Oral Daily  . polyethylene glycol  17 g Oral Daily  . senna-docusate  1 tablet Oral QHS  . sodium chloride flush  3 mL Intravenous Q12H   Continuous Infusions: . sodium chloride    . cefTRIAXone (ROCEPHIN)  IV Stopped (11/21/16 0900)     LOS: 4 days        Mauricio Annett Gula, MD [facility] Pager [phone]

## 2016-11-21 NOTE — Significant Event (Addendum)
Rapid Response Event Note  Overview: Time Called: 0430 Arrival Time: 0445 Event Type: Respiratory  Initial Focused Assessment: Called by RNs to assess patient for respiratory distress.  Per RN, since around 2300, patient has become more labored and patient has been tachypneic.  I asked the RN's to call RT to evaluate the patient and I would get as soon as I could.   Upon arrival, patient's breathing was labored mildly, RT did administer albuterol neb x 1 prior to my arrival  SpO2 was maintaining on 3L, RN stated that patient did desat earlier but he had removed his oxygen off.  Lung sounds coarse crackles bilaterally in the bases and diminished throughout.  Per RN, patient has vomited x 2, concern for some aspiration ? VSS. Patient's breathing improved after albuterol treatment.    Interventions: -  Lasix IV - CXR - > 750 in bladder, RNs to I and O, asked RN to bladder scan patient after Lasix administration  Plan of Care (if not transferred): - will follow as needed   Event Summary: Name of Physician Notified: TRH NP by RR RN  at 0459    at          [facility], Tobias Alexander

## 2016-11-21 NOTE — Progress Notes (Signed)
Pt had one occurrence of coffee ground emesis at 2310, denied nausea after emesis. Lungs sounds clear on auscultation, RR increase up to 30 resp/min, SpO2 94 on 2L, BP 121/59, pt denied SOB and chest pain. Increased confusion noted on assessment, pt noted to be periodically confused previously. NP on call notified, will continue to monitor pt and report any changes in pt condition. At this time pt resting.

## 2016-11-21 NOTE — Progress Notes (Signed)
Pt had an 11 beat run of V-tach. Pt asymptomatic. MD Arrien made aware. Will continue to assess.

## 2016-11-21 NOTE — Consult Note (Addendum)
WOC Nurse wound consult note Reason for Consult:sacral wound, unstageable, left lateral stump DTI Wound type: pressure Pressure Injury POA: Yes Measurement: left lateral stump DTI 2cm x 3cm Sacral wound 15cm x 9cm 0cm black eschar Wound bed:100% black shell like eschar Drainage (amount, consistency, odor) none Periwound: intact Dressing procedure/placement/frequency: It is my belief this sacral wound was a wound that was present on admission and mislabeled. MD noted as a stage I, Nursing noted as a bruise.  This is only his fourth day here therefore this has to have been an evolving DTI that was mislabeled. It is too advanced to have started on 9/10. I will place orders for Santyl dressing changes for now but would recommend a surgical consult for this extensive injury. I will text page Dr Ella Jubilee. Would also recommend a nutritional consult as he is malnourished. Pt already on a therapeutic mattress with low air loss feature. When I arrived in room pt was in resp distress, nail beds were blue. Oxygen sensor was off.  Obtained new sensor, sats were 78 on 3 liters. Pt with dyspnea, resp 40, rales and wheezing audible. HOB elevated, oxygen increased, RN summoned to room to assess and treat respiratory distress. We will follow this patient and remain available to this patient, nursing, and the medical and surgical teams.  Barnett Hatter, RN-C, WTA-C Wound Treatment Associate

## 2016-11-21 NOTE — Progress Notes (Signed)
Brief Nutrition Follow-Up Note  RD consulted for assessment of nutritional status/needs.   Per chart review, pt underwent right thoracentesis, which yielded 1.2 L of fluid, related to pleural effusion.   RD evaluated pt on 11/20/16; see progress note for further details. Note that pt is on a heart healthy, carb modified diet. No meal completion data available at this time. Pt was ordered Glucerna Shake po TID, each supplement provides 220 kcal and 10 grams of protein, and MVI daily. Per MAR, pt is taking Glucerna supplements.   Albumin has a half-life of 21 days and is strongly affected by stress response and inflammatory process, therefore, do not expect to see an improvement in this lab value during acute hospitalization. When a patient presents with low albumin, it is likely skewed due to the acute inflammatory response. Note that low albumin is no longer used to diagnose malnutrition; [place] uses the new malnutrition guidelines published by the American Society for Parenteral and Enteral Nutrition (A.S.P.E.N.) and the Academy of Nutrition and Dietetics (AND).    Reviewed [facility] notes, pt with DPTI on lt lateral stump and unstageable pressure injury on sacrum.   RD will continue to follow.  [place] Sarinana A. Mayford Knife, RD, LDN, CDE Pager: [phone] After hours Pager: [phone]

## 2016-11-21 NOTE — Progress Notes (Signed)
Pt had second occurrence of coffee ground emesis, respirations labored, using accessory muscles, other VS stable, pt denied pain, increased lethargy. NP on call notified, new orders placed. Respiratory and RR nurse called.  Will come to assess pt.

## 2016-11-21 NOTE — Progress Notes (Signed)
Inpatient Diabetes Program Recommendations  AACE/ADA: New Consensus Statement on Inpatient Glycemic Control (2015)  Target Ranges:  Prepandial:   less than 140 mg/dL      Peak postprandial:   less than 180 mg/dL (1-2 hours)      Critically ill patients:  140 - 180 mg/dL   Lab Results  Component Value Date   GLUCAP 254 (H) 11/21/2016   HGBA1C 7.4 (H) 08/12/2016    Review of Glycemic Control  Results for [name], [name] (MRN [id number]) as of 11/21/2016 13:13  Ref. Range 11/20/2016 11:53 11/20/2016 16:51 11/20/2016 21:22 11/21/2016 07:37 11/21/2016 12:38  Glucose-Capillary Latest Ref Range: 65 - 99 mg/dL 409 (H) 811 (H) 914 (H) 224 (H) 254 (H)   Diabetes history: Type 2 Outpatient Diabetes medications: Novolog 8 units bid, Levemir 12-16 units bid Current orders for Inpatient glycemic control: Novolog 0-9 units tid, Novolog 0-5 units qhs  Inpatient Diabetes Program Recommendations: CBG consistently above /dl- Consider restarting Levemir at half the home dose- Levemir 6 units bid.  Tony Racer, RN, BA, MHA, CDE Diabetes Coordinator Inpatient Diabetes Program  [phone] (Team Pager) [phone] [facility] Office) 11/21/2016 1:16 PM

## 2016-11-22 DIAGNOSIS — L8945 Pressure ulcer of contiguous site of back, buttock and hip, unstageable: Secondary | ICD-10-CM

## 2016-11-22 LAB — CBC WITH DIFFERENTIAL/PLATELET
BASOS ABS: 0 10*3/uL (ref 0.0–0.1)
BASOS PCT: 0 %
EOS ABS: 0.1 10*3/uL (ref 0.0–0.7)
Eosinophils Relative: 1 %
HCT: 26.1 % — ABNORMAL LOW (ref 39.0–52.0)
HEMOGLOBIN: 8 g/dL — AB (ref 13.0–17.0)
Lymphocytes Relative: 17 %
Lymphs Abs: 1.2 10*3/uL (ref 0.7–4.0)
MCH: 27.5 pg (ref 26.0–34.0)
MCHC: 30.7 g/dL (ref 30.0–36.0)
MCV: 89.7 fL (ref 78.0–100.0)
Monocytes Absolute: 0.8 10*3/uL (ref 0.1–1.0)
Monocytes Relative: 10 %
NEUTROS PCT: 72 %
Neutro Abs: 5.3 10*3/uL (ref 1.7–7.7)
Platelets: 165 10*3/uL (ref 150–400)
RBC: 2.91 MIL/uL — AB (ref 4.22–5.81)
RDW: 16.9 % — ABNORMAL HIGH (ref 11.5–15.5)
WBC: 7.3 10*3/uL (ref 4.0–10.5)

## 2016-11-22 LAB — BASIC METABOLIC PANEL
Anion gap: 8 (ref 5–15)
BUN: 56 mg/dL — ABNORMAL HIGH (ref 6–20)
CALCIUM: 8 mg/dL — AB (ref 8.9–10.3)
CO2: 24 mmol/L (ref 22–32)
CREATININE: 2.01 mg/dL — AB (ref 0.61–1.24)
Chloride: 113 mmol/L — ABNORMAL HIGH (ref 101–111)
GFR calc non Af Amer: 28 mL/min — ABNORMAL LOW (ref >60)
GFR, EST AFRICAN AMERICAN: 33 mL/min — AB (ref >60)
Glucose, Bld: 228 mg/dL — ABNORMAL HIGH (ref 65–99)
Potassium: 3.4 mmol/L — ABNORMAL LOW (ref 3.5–5.1)
SODIUM: 145 mmol/L (ref 135–145)

## 2016-11-22 LAB — GLUCOSE, CAPILLARY
GLUCOSE-CAPILLARY: 210 mg/dL — AB (ref 65–99)
Glucose-Capillary: 254 mg/dL — ABNORMAL HIGH (ref 65–99)
Glucose-Capillary: 214 mg/dL — ABNORMAL HIGH (ref 65–99)
Glucose-Capillary: 221 mg/dL — ABNORMAL HIGH (ref 65–99)

## 2016-11-22 LAB — CULTURE, BLOOD (ROUTINE X 2)
CULTURE: NO GROWTH
CULTURE: NO GROWTH
SPECIAL REQUESTS: ADEQUATE
Special Requests: ADEQUATE

## 2016-11-22 LAB — LACTATE DEHYDROGENASE: LDH: 212 U/L — AB (ref 98–192)

## 2016-11-22 LAB — PROTEIN, TOTAL: Total Protein: 5.2 g/dL — ABNORMAL LOW (ref 6.5–8.1)

## 2016-11-22 MED ORDER — POTASSIUM CHLORIDE CRYS ER 20 MEQ PO TBCR
40.0000 meq | EXTENDED_RELEASE_TABLET | Freq: Once | ORAL | Status: AC
  Administered 2016-11-22: 40 meq via ORAL
  Filled 2016-11-22: qty 2

## 2016-11-22 MED ORDER — POTASSIUM CHLORIDE 20 MEQ PO PACK
40.0000 meq | PACK | Freq: Once | ORAL | Status: DC
  Filled 2016-11-22: qty 2

## 2016-11-22 NOTE — Progress Notes (Signed)
PROGRESS NOTE    Tony Mcclure  UJW:[id number] DOB: 1928/05/08 DOA: 11/17/2016 PCP: [name], MD    Brief Narrative:  83 year old male who presented with sepsis. Patient is known to have hypertension, coronary artery disease, diabetes mellitus type 2, chronic kidney disease stage IV, atrial fibrillation on anticoagulation, status post bilateral BKA. Patient was noted to be more confused over last several weeks, positive right arm edema. Due to worsening symptoms he was referred to the hospital for further evaluation. On the initial physical examination, blood pressure 167/86, heart rate 105, temperature 101.4, respiratory rate 30, oxygen saturation 97%. Moist mucous membranes, lungs had decreased breath sounds at bases with bibasilar rales, decreased sounds at the right base, heart S1-S2 present and rhythmic, no gallops, rubs or murmurs, the abdomen was soft nontender, bilateral BKA, positive pitting edema, +++. Urinalysis with to numerous to count white cells. Chest x-ray rotated to the right side, right pleural effusion, increased lung markings bilaterally.  Patient was admitted to the medical unit with the working diagnosis of sepsis due to urinary tract infection, present on admission, complicated by both overload.   Patient placed on furosemide for diuresis, had right thoracentesis, 1200 ml drained. Had episodes of VT suspected to be triggered by hypoxia and electrolyte abnormalities, clinically improving.    Assessment & Plan:   Principal Problem:   Sepsis secondary to UTI [facility]) Active Problems:   CAD (coronary artery disease)   Type 2 diabetes mellitus (HCC)   Essential hypertension   S/P bilateral below knee amputation (HCC)   CKD (chronic kidney disease), stage IV (HCC)   Persistent atrial fibrillation (HCC)   Hypokalemia   Pressure injury of skin   1. Sepsis due urinary tract infection, present on admission. Improved mentation, on antibiotic with IV ceftriaxone  #2. Patient has remained afebrile and cell count at 7.3. Will continue diuresis with furosemide. Cultures have with no growth.   2. Worsening acute hypoxic respiratory failure, due to pulmonary edema with right pleural effusion, decompensated diastolic heart failure, acute on chronic. Patient had thoracentesis on the right with 1,200 ml fluid drained, cw transudate per Light's criteria, (note protein ration 5). Follow up chest film with improvement of infiltrates and right pleural effusion, will continue oxymetry monitoring and supplemental 02 per [place], to target 02 saturation greater than 92%.  3. Paroxysmal atrial fibrillation, with episodic non sustained VT. Episodic asymptomatic VT, decreased in intensity and duration will continue to replete electrolytes, target K at 4 and Mg at 2. Echocardiogram with normal LV systolic function. Will continue supplemental 02 per [place].   4. Coronary disease status post CABG. Will continue with apixaban. No signs of bleeding. Not on antiplatelet therapy, patient is chest pain free.   5. Chronic kidney disease stage IV. Stable renal function with serum cr at 2,0, will continue to replete K, and  Mg. Serum bicarbonate at 24. Urine output over last 24 hours, at 800 ml.   6. Type 2 diabetes mellitus. On insulin sliding scale for glucose cover and monitoring, capillary glucose 254, 222, 223, 210, 254. Patient continue to tolerate po.  7. HTN. Continue blood pressure control with  hydralazine, metoprolol, minoxidil.   8. Large decubitus ulcer, present on admission. 15 cm x 9 cm, with black scar, not able to stage, very poor prognosis, due to patient non ambulatory, will consult surgery for evaluation. May need to hold on apixaban if pan debridement.   9. Severe protein calorie malnutrition. Will continue with nutritional supplements.  DVT prophylaxis:epixaban Code Status:dnr Family Communication: Disposition  Plan:snf   Consultants:    Procedures:    Antimicrobials:   Zosyn dc 09/13  Ceftriaxone   Subjective: Patient feeling better, dyspnea has improved, no chest pain, no nausea or vomiting. Continue to have episodic asymptomatic episodes of wide complex tachycardia.   Objective: Vitals:   11/22/16 0100 11/22/16 0501 11/22/16 0758 11/22/16 0826  BP:  (!) 130/91  (!) 168/68  Pulse:  71  75  Resp:  18    Temp:  (!) 97.5 F (36.4 C)    TempSrc:  Oral    SpO2:  93% 95%   Weight: 83.9 kg (185 lb)     Height:        Intake/Output Summary (Last 24 hours) at 11/22/16 1040 Last data filed at 11/21/16 1835  Gross per 24 hour  Intake               50 ml  Output              800 ml  Net             -750 ml   Filed Weights   11/18/16 0529 11/20/16 0554 11/22/16 0100  Weight: 94.2 kg (207 lb 9.6 oz) 92.5 kg (203 lb 14.8 oz) 83.9 kg (185 lb)    Examination:  General: deconditioned Neurology: Awake and alert. Mild confusion  E ENT: Positive pallor, no icterus, oral mucosa moist Cardiovascular: No JVD. S1-S2 present, rhythmic, no gallops, rubs, or murmurs. Positive pitting edema ++/+++ at the dependent zones.  Pulmonary: decreased breath sounds bilaterally at bases more right than left, decreased air movement, positive expiratory wheezing,with rhonchi, positive scattetered rales. Gastrointestinal. Abdomen flat, no organomegaly, non tender, no rebound or guarding Skin.  Large and deep stage IV decubitus ulcer, necrotic tissue at the base, no evidence of local purulence, about 15 cm length.  Musculoskeletal: no joint deformities. Bilateral BKA.      Data Reviewed: I have personally reviewed following labs and imaging studies  CBC:  Recent Labs Lab 11/17/16 1850 11/18/16 0738 11/19/16 0936 11/20/16 0432 11/21/16 0734 11/22/16 0505  WBC 13.2* 14.2* 8.2 8.2 7.2 7.3  NEUTROABS 10.2*  --   --  6.3 5.4 5.3  HGB 8.9* 9.1* 8.4* 8.5* 8.6* 8.0*  HCT 27.5* 29.2* 26.7*  28.1* 27.9* 26.1*  MCV 88.4 89.8 88.1 88.9 89.7 89.7  PLT 121* 133* 120* 142* 161 165   Basic Metabolic Panel:  Recent Labs Lab 11/17/16 1850 11/18/16 0738  11/18/16 1646 11/19/16 0635 11/20/16 0432 11/21/16 0734 11/22/16 0505  NA 139 142  < > 142 143 142 140 145  K 2.1* 2.8*  < > 3.1* 3.8 3.3* 3.1* 3.4*  CL 111 111  < > 112* 119* 110 106 113*  CO2 20* 21*  < > 20* GLUCOSE 245* 203*  < > 186* 196* 205* 242* 228*  BUN 40* 44*  < > 48* 53* 51* 51* 56*  CREATININE 1.87* 2.04*  < > 2.10* 2.10* 1.95* 2.11* 2.01*  CALCIUM 7.2* 7.9*  < > 7.9* 6.1* 7.8* 7.8* 8.0*  MG 1.8 2.6*  --   --  2.4  --  2.2  --   < > = values in this interval not displayed. GFR: Estimated Creatinine Clearance: 26.8 mL/min (A) (by C-G formula based on SCr of 2.01 mg/dL (H)). Liver Function Tests:  Recent Labs Lab 11/17/16 1850 11/18/16 0738 11/18/16 1110 11/21/16 0734 11/22/16  0505  AST 47* 58* 49* 26  --   ALT 25 33 31 39  --   ALKPHOS 68 88 81 91  --   BILITOT 0.7 1.0 0.9 0.9  --   PROT 4.9* 5.4* 5.1* 5.2* 5.2*  ALBUMIN 2.6* 2.7* 2.5* 2.4*  --    No results for input(s): LIPASE, AMYLASE in the last 168 hours. No results for input(s): AMMONIA in the last 168 hours. Coagulation Profile: No results for input(s): INR, PROTIME in the last 168 hours. Cardiac Enzymes: No results for input(s): CKTOTAL, CKMB, CKMBINDEX, TROPONINI in the last 168 hours. BNP (last 3 results) No results for input(s): PROBNP in the last 8760 hours. HbA1C: No results for input(s): HGBA1C in the last 72 hours. CBG:  Recent Labs Lab 11/21/16 0737 11/21/16 1238 11/21/16 1646 11/21/16 2151 11/22/16 0738  GLUCAP 224* 254* 222* 223* 210*   Lipid Profile: No results for input(s): CHOL, HDL, LDLCALC, TRIG, CHOLHDL, LDLDIRECT in the last 72 hours. Thyroid Function Tests: No results for input(s): TSH, T4TOTAL, FREET4, T3FREE, THYROIDAB in the last 72 hours. Anemia Panel: No results for input(s): VITAMINB12,  FOLATE, FERRITIN, TIBC, IRON, RETICCTPCT in the last 72 hours.    Radiology Studies: I have reviewed all of the imaging during this hospital visit personally     Scheduled Meds: . allopurinol  300 mg Oral Daily  . amLODipine  10 mg Oral Daily  . atorvastatin  20 mg Oral q1800  . collagenase   Topical Daily  . feeding supplement (GLUCERNA SHAKE)  237 mL Oral TID BM  . furosemide  40 mg Intravenous Daily  . hydrALAZINE  25 mg Oral BID  . insulin aspart  0-5 Units Subcutaneous QHS  . insulin aspart  0-9 Units Subcutaneous TID WC  . ipratropium-albuterol  3 mL Nebulization TID  . mouth rinse  15 mL Mouth Rinse BID  . metoprolol tartrate  25 mg Oral BID  . minoxidil  2.5 mg Oral BID  . multivitamin with minerals  1 tablet Oral Daily  . polyethylene glycol  17 g Oral Daily  . senna-docusate  1 tablet Oral QHS  . sodium chloride flush  3 mL Intravenous Q12H   Continuous Infusions: . sodium chloride    . cefTRIAXone (ROCEPHIN)  IV Stopped (11/22/16 1019)     LOS: 5 days        Mauricio Annett Gula, MD [facility] Pager [phone]

## 2016-11-22 NOTE — Progress Notes (Signed)
Pt had 2 runs of V-tach. The first was a 5 beat run and the second was 6. MD Arrien made aware. Will continue to assess.

## 2016-11-22 NOTE — Plan of Care (Signed)
Problem: Education: Goal: Knowledge of [facility] Education information/materials will improve Outcome: Progressing POC reviewed with pt.- pt. seem to understand some.

## 2016-11-23 ENCOUNTER — Inpatient Hospital Stay (HOSPITAL_COMMUNITY): Payer: Medicare Other

## 2016-11-23 LAB — GLUCOSE, CAPILLARY
GLUCOSE-CAPILLARY: 276 mg/dL — AB (ref 65–99)
GLUCOSE-CAPILLARY: 276 mg/dL — AB (ref 65–99)
GLUCOSE-CAPILLARY: 186 mg/dL — AB (ref 65–99)

## 2016-11-23 LAB — BASIC METABOLIC PANEL
Anion gap: 9 (ref 5–15)
BUN: 57 mg/dL — ABNORMAL HIGH (ref 6–20)
CHLORIDE: 110 mmol/L (ref 101–111)
CO2: 24 mmol/L (ref 22–32)
CREATININE: 1.74 mg/dL — AB (ref 0.61–1.24)
Calcium: 7.8 mg/dL — ABNORMAL LOW (ref 8.9–10.3)
GFR calc non Af Amer: 34 mL/min — ABNORMAL LOW (ref >60)
GFR, EST AFRICAN AMERICAN: 39 mL/min — AB (ref >60)
Glucose, Bld: 301 mg/dL — ABNORMAL HIGH (ref 65–99)
Potassium: 4.5 mmol/L (ref 3.5–5.1)
Sodium: 143 mmol/L (ref 135–145)

## 2016-11-23 LAB — MAGNESIUM: Magnesium: 2.2 mg/dL (ref 1.7–2.4)

## 2016-11-23 MED ORDER — FUROSEMIDE 10 MG/ML IJ SOLN
60.0000 mg | Freq: Two times a day (BID) | INTRAMUSCULAR | Status: DC
  Administered 2016-11-23 – 2016-11-25 (×5): 60 mg via INTRAVENOUS
  Filled 2016-11-23 (×5): qty 6

## 2016-11-23 MED ORDER — MORPHINE SULFATE (PF) 2 MG/ML IV SOLN
1.0000 mg | INTRAVENOUS | Status: DC | PRN
  Administered 2016-11-23: 1 mg via INTRAVENOUS
  Filled 2016-11-23: qty 1

## 2016-11-23 MED ORDER — IPRATROPIUM-ALBUTEROL 0.5-2.5 (3) MG/3ML IN SOLN: 3.0000 mL | RESPIRATORY_TRACT | Status: DC | PRN

## 2016-11-23 MED ORDER — APIXABAN 2.5 MG PO TABS
2.5000 mg | ORAL_TABLET | Freq: Two times a day (BID) | ORAL | Status: DC
  Administered 2016-11-23 – 2016-11-26 (×6): 2.5 mg via ORAL
  Filled 2016-11-23 (×6): qty 1

## 2016-11-23 MED ORDER — IPRATROPIUM-ALBUTEROL 0.5-2.5 (3) MG/3ML IN SOLN
3.0000 mL | Freq: Four times a day (QID) | RESPIRATORY_TRACT | Status: DC
  Administered 2016-11-23 – 2016-11-24 (×6): 3 mL via RESPIRATORY_TRACT
  Filled 2016-11-23 (×6): qty 3

## 2016-11-23 NOTE — Progress Notes (Addendum)
PROGRESS NOTE    Tony Mcclure  ZOX:[id number] DOB: 01-11-1929 DOA: 11/17/2016 PCP: [name], MD    Brief Narrative:  83 year old male who presented with sepsis. Patient is known to have hypertension, coronary artery disease, diabetes mellitus type 2, chronic kidney disease stage IV, atrial fibrillation on anticoagulation, status post bilateral BKA. Patient was noted to be more confused over last several weeks, positive right arm edema. Due to worsening symptoms he was referred to the hospital for further evaluation. On the initial physical examination, blood pressure 167/86, heart rate 105, temperature 101.4, respiratory rate 30, oxygen saturation 97%. Moist mucous membranes, lungs had decreased breath sounds at bases with bibasilar rales, decreased sounds at the right base, heart S1-S2 present and rhythmic, no gallops, rubs or murmurs, the abdomen was soft nontender, bilateral BKA, positive pitting edema, +++. Urinalysis with to numerous to count white cells. Chest x-ray rotated to the right side, right pleural effusion, increased lung markings bilaterally.  Patient was admitted to the medical unit with the working diagnosis of sepsis due to urinary tract infection, present on admission, complicated by both overload.   Patient placed on furosemide for diuresis, had right thoracentesis, 1200 ml drained. Had episodes of VT suspected to be triggered by hypoxia and electrolyte abnormalities, clinically improving   Assessment & Plan:   Principal Problem:   Sepsis secondary to UTI [facility]) Active Problems:   CAD (coronary artery disease)   Type 2 diabetes mellitus (HCC)   Essential hypertension   S/P bilateral below knee amputation (HCC)   CKD (chronic kidney disease), stage IV (HCC)   Persistent atrial fibrillation (HCC)   Hypokalemia   Pressure injury of skin  1. Sepsis due urinary tract infection, present on admission. On antibiotic with IV ceftriaxone, effective antibiotic  therapy  #6. Continue diuresis with furosemide. Cultures have with no growth. Fluctuating mentation, this am worse due to respiratory distress.   2. Worsening acute hypoxicrespiratory failure, due to pulmonary edema with right pleural effusion, decompensated diastolic heart failure, acute on chronic. Rapid response called due to patient's worsening respiratory distress, order transfer to step down unit to start non invasive mechanical ventilation, (patient DNR). Will do stat chest film and will increase furosemide to 60 mg IV bid. Urine output over last 24 hours, 600 ml.   3. Paroxysmal atrial fibrillation, with episodic non sustained VT. Episodic asymptomatic VT, K at 4,5 and mg at 2,2, will continue to monitor telemetry monitoring, echocardiogram with normal LV systolic function. Will continue anticoagulation with apixaban.  4. Coronary disease status post CABG. On apixaban, holding antiplatelet therapy. No signs of bleeding.   5. Chronic kidney disease stage IV. Serum cr down to 1, 74 with serum K at 4,5 and serum bicarbonate at 24, will continue aggressive diuresis, and follow up on renal panel in am.   6. Type 2 diabetes mellitus. Continue insulin sliding scale for glucose cover and monitoring, capillary glucose 223, 210, 254, 221.  Decreased po intake due to altered mental status.   7. HTN. On hydralazine, metoprolol, minoxidil, systolic blood pressure 130's  8. Large decubitus ulcer, present on admission. 15 cm x 9 cm, with black scar, not able to stage, very poor prognosis, will hold on surgical consult for now, patient not medically stable due to respiratory failure, will continue wound care, and air mattress.   9. Severe protein calorie malnutrition. Nutritional supplements, as tolerated.   DVT prophylaxis:epixaban Code Status:dnr Family Communication:Late entry: I spoke with patient's daughter over the phone, explained  patient's condition, persistent edema, including  respiratory failure, large decubitus ulcer, malnutrition and the advance severity of his condition. Confirmed patient is DNR. Arranged for family meeting tomorrow at patient's room.   Disposition Plan:snf   Consultants:    Procedures:    Antimicrobials:   Zosyn dc 09/13  Ceftriaxone   Subjective: Early this am, patient developed worsening work of breathing, with worsening confusion and oxygen desaturation down to the 60's. Patient required increased fi02 up to 100%. Currently continue to be in respiratory distress.   Objective: Vitals:   11/22/16 2144 11/23/16 0420 11/23/16 0455 11/23/16 0747  BP: (!) 131/98  (!) 149/62 139/60  Pulse: 81  96 70  Resp:   20 (!) 36  Temp: 98.2 F (36.8 C)  98.4 F (36.9 C) 98.1 F (36.7 C)  TempSrc: Oral  Oral Oral  SpO2: 96%  90% 100%  Weight:  93.9 kg (207 lb)    Height:        Intake/Output Summary (Last 24 hours) at 11/23/16 0829 Last data filed at 11/23/16 0419  Gross per 24 hour  Intake              300 ml  Output              600 ml  Net             -300 ml   Filed Weights   11/20/16 0554 11/22/16 0100 11/23/16 0420  Weight: 92.5 kg (203 lb 14.8 oz) 83.9 kg (185 lb) 93.9 kg (207 lb)    Examination:  General: in respiratory distress Neurology: somnolent  E ENT: positive pallor, no icterus, oral mucosa dry Cardiovascular: No JVD. S1-S2 present, irregularly irregular, no gallops, rubs, or murmurs. No jugular venous distention, pitting edema at the dependent zones ++. Pulmonary: decreased breath sounds bilaterally, poor air movement, no wheezing, but positive rhonchi and rales. Gastrointestinal. Abdomen distended, no organomegaly, non tender, no rebound or guarding Skin. Large stage IV decubitus ulcer Musculoskeletal: no joint deformities     Data Reviewed: I have personally reviewed following labs and imaging studies  CBC:  Recent Labs Lab 11/17/16 1850 11/18/16 0738 11/19/16 0936 11/20/16 0432  11/21/16 0734 11/22/16 0505  WBC 13.2* 14.2* 8.2 8.2 7.2 7.3  NEUTROABS 10.2*  --   --  6.3 5.4 5.3  HGB 8.9* 9.1* 8.4* 8.5* 8.6* 8.0*  HCT 27.5* 29.2* 26.7* 28.1* 27.9* 26.1*  MCV 88.4 89.8 88.1 88.9 89.7 89.7  PLT 121* 133* 120* 142* 161 165   Basic Metabolic Panel:  Recent Labs Lab 11/17/16 1850 11/18/16 0738  11/18/16 1646 11/19/16 0635 11/20/16 0432 11/21/16 0734 11/22/16 0505  NA 139 142  < > 142 143 142 140 145  K 2.1* 2.8*  < > 3.1* 3.8 3.3* 3.1* 3.4*  CL 111 111  < > 112* 119* 110 106 113*  CO2 20* 21*  < > 20* GLUCOSE 245* 203*  < > 186* 196* 205* 242* 228*  BUN 40* 44*  < > 48* 53* 51* 51* 56*  CREATININE 1.87* 2.04*  < > 2.10* 2.10* 1.95* 2.11* 2.01*  CALCIUM 7.2* 7.9*  < > 7.9* 6.1* 7.8* 7.8* 8.0*  MG 1.8 2.6*  --   --  2.4  --  2.2  --   < > = values in this interval not displayed. GFR: Estimated Creatinine Clearance: 28.3 mL/min (A) (by C-G formula based on SCr of 2.01 mg/dL (H)). Liver Function Tests:  Recent Labs Lab 11/17/16 1850 11/18/16 0738 11/18/16 1110 11/21/16 0734 11/22/16 0505  AST 47* 58* 49* 26  --   ALT 25 33 31 39  --   ALKPHOS 68 88 81 91  --   BILITOT 0.7 1.0 0.9 0.9  --   PROT 4.9* 5.4* 5.1* 5.2* 5.2*  ALBUMIN 2.6* 2.7* 2.5* 2.4*  --    No results for input(s): LIPASE, AMYLASE in the last 168 hours. No results for input(s): AMMONIA in the last 168 hours. Coagulation Profile: No results for input(s): INR, PROTIME in the last 168 hours. Cardiac Enzymes: No results for input(s): CKTOTAL, CKMB, CKMBINDEX, TROPONINI in the last 168 hours. BNP (last 3 results) No results for input(s): PROBNP in the last 8760 hours. HbA1C: No results for input(s): HGBA1C in the last 72 hours. CBG:  Recent Labs Lab 11/21/16 2151 11/22/16 0738 11/22/16 1146 11/22/16 1712 11/22/16 2141  GLUCAP 223* 210* 254* 214* 221*   Lipid Profile: No results for input(s): CHOL, HDL, LDLCALC, TRIG, CHOLHDL, LDLDIRECT in the last 72  hours. Thyroid Function Tests: No results for input(s): TSH, T4TOTAL, FREET4, T3FREE, THYROIDAB in the last 72 hours. Anemia Panel: No results for input(s): VITAMINB12, FOLATE, FERRITIN, TIBC, IRON, RETICCTPCT in the last 72 hours.    Radiology Studies: I have reviewed all of the imaging during this hospital visit personally     Scheduled Meds: . allopurinol  300 mg Oral Daily  . amLODipine  10 mg Oral Daily  . atorvastatin  20 mg Oral q1800  . collagenase   Topical Daily  . feeding supplement (GLUCERNA SHAKE)  237 mL Oral TID BM  . furosemide  40 mg Intravenous Daily  . hydrALAZINE  25 mg Oral BID  . insulin aspart  0-5 Units Subcutaneous QHS  . insulin aspart  0-9 Units Subcutaneous TID WC  . ipratropium-albuterol  3 mL Nebulization TID  . mouth rinse  15 mL Mouth Rinse BID  . metoprolol tartrate  25 mg Oral BID  . minoxidil  2.5 mg Oral BID  . multivitamin with minerals  1 tablet Oral Daily  . polyethylene glycol  17 g Oral Daily  . senna-docusate  1 tablet Oral QHS  . sodium chloride flush  3 mL Intravenous Q12H   Continuous Infusions: . sodium chloride    . cefTRIAXone (ROCEPHIN)  IV Stopped (11/22/16 1019)     LOS: 6 days        Mauricio Annett Gula, MD [facility] Pager [phone]

## 2016-11-23 NOTE — Progress Notes (Signed)
ANTICOAGULATION CONSULT NOTE - Initial Consult  Pharmacy Consult for apixaban Indication: atrial fibrillation  No Known Allergies  Patient Measurements: H [id number]  (167.6 cm) Weight: 207 lb (93.9 kg) IBW/kg (Calculated) : 63.8   Vital Signs: Temp: 98.1 F (36.7 C) (09/16 0747) Temp Source: Oral (09/16 0747) BP: 139/60 (09/16 0747) Pulse Rate: 90 (09/16 1359)  Labs:  Recent Labs  11/21/16 0734 11/22/16 0505 11/23/16 0721  HGB 8.6* 8.0*  --   HCT 27.9* 26.1*  --   PLT 161 165  --   CREATININE 2.11* 2.01* 1.74*    Estimated Creatinine Clearance: 32.7 mL/min (A) (by C-G formula based on SCr of 1.74 mg/dL (H)).   Medical History: Past Medical History:  Diagnosis Date  . Acute diastolic heart failure (HCC)   . Atrial fibrillation with slow ventricular response (HCC) 07/28/2016  . BPH (benign prostatic hyperplasia)   . CAD (coronary artery disease)   . CKD (chronic kidney disease), stage III 07/26/2016  . CKD (chronic kidney disease), stage IV (HCC)   . Diabetes mellitus without complication (HCC)   . HLD (hyperlipidemia) 08/23/2016  . Hypertension   . Kidney disease   . Normocytic anemia 07/26/2016  . Paroxysmal A-fib (HCC) 07/28/2016  . S/P bilateral below knee amputation (HCC) 07/26/2016  . S/P bilateral BKA (below knee amputation) (HCC)   . Type 2 diabetes mellitus (HCC) 07/26/2016    Medications:  Scheduled:  . allopurinol  300 mg Oral Daily  . amLODipine  10 mg Oral Daily  . atorvastatin  20 mg Oral q1800  . collagenase   Topical Daily  . feeding supplement (GLUCERNA SHAKE)  237 mL Oral TID BM  . furosemide  60 mg Intravenous BID  . hydrALAZINE  25 mg Oral BID  . insulin aspart  0-5 Units Subcutaneous QHS  . insulin aspart  0-9 Units Subcutaneous TID WC  . ipratropium-albuterol  3 mL Nebulization Q6H  . mouth rinse  15 mL Mouth Rinse BID  . metoprolol tartrate  25 mg Oral BID  . minoxidil  2.5 mg Oral BID  . multivitamin with minerals  1 tablet Oral  Daily  . polyethylene glycol  17 g Oral Daily  . senna-docusate  1 tablet Oral QHS    Assessment: 88 YOM on eliquis PTA admitted for sepsis and had eliquis held for hemoptysis and planned procedure.  Now restarting eliquis s/p procedure per MD.  Pt is >80 years in age, with a SCr of 1.74.  H/H low but stable with plts wnl and no bleeding reported at this time.  Goal of Therapy:  Monitor platelets by anticoagulation protocol: Yes   Plan:  Eliquis 2.5mg  PO twice daily Monitor s/s bleeding  Dion Body 11/23/2016,2:24 PM

## 2016-11-23 NOTE — Significant Event (Signed)
Rapid Response Event Note  Overview:  Called at change of shift with report of patient who may need Bipap Time Called: 0656 Arrival Time: 0715 Event Type: Respiratory  Initial Focused Assessment:  On arrival patinet supine in bed.  Warm and dry - pink.  Arouses to name.  States no to pain or SOB question.  Bil BS with upper airway exp wheezing - lower lobes decreased BS.  Using abdominal muscles to breathe - increased WOB. On NRB mask - O2 sats 100%.  HR 82 BP 139/60 RR 36.  RN states patient had 2 episodes of desaturations - note states when pulling off nasal cannula.     Interventions:  Repositioned in bed.  Oral care and oral suctioning done.  Has fair cough reflex. Chart reviewed.   RN Derryl [place] to page MD and request clear goals of care with family regarding wishes for aggressive care vs comfort.  Patient in moderate distress - may benefit from Bipap short term - will require transfer of floors. Dr. Ella Jubilee to bedside [facility] and labs ordered. On recheck of patient - now more alert - speech slurred but able to communicate some.  Denies pain or SOB.  WOB has decreased - less wheezing noted - using abdominal muscles but less labored - RR down some.  O2 sat remains 100% on NRB  Mask - weaned while monitoring O2 sats to 35% with O2 sats remaining 98%.  Mouth breather - delivery of O2 via mask versus concentration of O2 is greater need. PCXR reveals increase in fluid- Lasix ordered per MD.  Suggested little bit of Morphine for now mild increase WOB instead of Bipap.  Dr. Ella Jubilee contacted per [facility] RN - morphine administered.   Recheck:  11 am   Patient resting more comfortable.  More alert.  Decreased WOB.  On 4 liter nasal cannula with O2 sats 95%.  Plan of Care (if not transferred):  RN to monitor for resp distress - to call as needed.  Will follow  Event Summary: Name of Physician Notified: [facility] NP by night RN .    Dr. Ella Jubilee on day shift at  (pta RRT)    at    Outcome: Stayed in room and  stabalized  Event End Time: 0900  [place]

## 2016-11-23 NOTE — Progress Notes (Signed)
Tele called to notify RN that pt was off tele. Upon entering the room pt found undressed with no o2 and very labored breathing with wheezing. Dinamap showed pt O2 sat being in the 60's.  Pt placed on Nonrebreather until 02 saturation became stable. Administered prn breathing treatment and repositioned pt. PT maintained O2 until 615 when tele alerted RN of O2 dropping again.Pt placed back on nonrebreather and RT and RR and [place] notified of pt status change. RT administered schedule treatment and recommend BiPAP. MD notified of RT recommendation and wanted to see if pt status improved after breathing treatment. RR will continue to follow pt and daytime MD aware of pt status.

## 2016-11-23 NOTE — Progress Notes (Signed)
Due to restrains being used (pt has mittens on), BiPAP was not placed on pt.

## 2016-11-24 DIAGNOSIS — E43 Unspecified severe protein-calorie malnutrition: Secondary | ICD-10-CM

## 2016-11-24 DIAGNOSIS — L89304 Pressure ulcer of unspecified buttock, stage 4: Secondary | ICD-10-CM

## 2016-11-24 LAB — CBC WITH DIFFERENTIAL/PLATELET
BASOS ABS: 0 10*3/uL (ref 0.0–0.1)
BASOS PCT: 0 %
Eosinophils Absolute: 0.1 10*3/uL (ref 0.0–0.7)
Eosinophils Relative: 1 %
HEMATOCRIT: 24.1 % — AB (ref 39.0–52.0)
Hemoglobin: 7.5 g/dL — ABNORMAL LOW (ref 13.0–17.0)
LYMPHS PCT: 23 %
Lymphs Abs: 1.5 10*3/uL (ref 0.7–4.0)
MCH: 28.2 pg (ref 26.0–34.0)
MCHC: 31.1 g/dL (ref 30.0–36.0)
MCV: 90.6 fL (ref 78.0–100.0)
Monocytes Absolute: 0.7 10*3/uL (ref 0.1–1.0)
Monocytes Relative: 11 %
NEUTROS ABS: 4.3 10*3/uL (ref 1.7–7.7)
NEUTROS PCT: 65 %
Platelets: 212 10*3/uL (ref 150–400)
RBC: 2.66 MIL/uL — AB (ref 4.22–5.81)
RDW: 17.3 % — AB (ref 11.5–15.5)
WBC: 6.7 10*3/uL (ref 4.0–10.5)

## 2016-11-24 LAB — BASIC METABOLIC PANEL
ANION GAP: 9 (ref 5–15)
BUN: 52 mg/dL — ABNORMAL HIGH (ref 6–20)
CALCIUM: 8.1 mg/dL — AB (ref 8.9–10.3)
CHLORIDE: 112 mmol/L — AB (ref 101–111)
CO2: 29 mmol/L (ref 22–32)
Creatinine, Ser: 1.68 mg/dL — ABNORMAL HIGH (ref 0.61–1.24)
GFR calc non Af Amer: 35 mL/min — ABNORMAL LOW (ref >60)
GFR, EST AFRICAN AMERICAN: 41 mL/min — AB (ref >60)
Glucose, Bld: 213 mg/dL — ABNORMAL HIGH (ref 65–99)
POTASSIUM: 3.5 mmol/L (ref 3.5–5.1)
Sodium: 150 mmol/L — ABNORMAL HIGH (ref 135–145)

## 2016-11-24 LAB — GLUCOSE, CAPILLARY
GLUCOSE-CAPILLARY: 253 mg/dL — AB (ref 65–99)
GLUCOSE-CAPILLARY: 163 mg/dL — AB (ref 65–99)
Glucose-Capillary: 216 mg/dL — ABNORMAL HIGH (ref 65–99)
Glucose-Capillary: 168 mg/dL — ABNORMAL HIGH (ref 65–99)

## 2016-11-24 LAB — PATHOLOGIST SMEAR REVIEW

## 2016-11-24 MED ORDER — IPRATROPIUM-ALBUTEROL 0.5-2.5 (3) MG/3ML IN SOLN
3.0000 mL | Freq: Three times a day (TID) | RESPIRATORY_TRACT | Status: DC
  Administered 2016-11-25 – 2016-11-26 (×4): 3 mL via RESPIRATORY_TRACT
  Filled 2016-11-24 (×5): qty 3

## 2016-11-24 MED ORDER — POTASSIUM CHLORIDE CRYS ER 20 MEQ PO TBCR
20.0000 meq | EXTENDED_RELEASE_TABLET | Freq: Once | ORAL | Status: AC
  Administered 2016-11-24: 20 meq via ORAL
  Filled 2016-11-24: qty 1

## 2016-11-24 NOTE — Care Management Important Message (Signed)
Important Message  Patient Details  Name: Tony Mcclure MRN: [id number] Date of Birth: 04/08/1928   Medicare Important Message Given:  Yes    Jordany Russett [place] 11/24/2016, 11:22 AM

## 2016-11-24 NOTE — Progress Notes (Signed)
Inpatient Diabetes Program Recommendations  AACE/ADA: New Consensus Statement on Inpatient Glycemic Control (2015)  Target Ranges:  Prepandial:   less than 140 mg/dL      Peak postprandial:   less than 180 mg/dL (1-2 hours)      Critically ill patients:  140 - 180 mg/dL   Lab Results  Component Value Date   GLUCAP 216 (H) 11/24/2016   HGBA1C 7.4 (H) 08/12/2016    Review of Glycemic ControlResults for Tony Mcclure, Tony Mcclure (MRN [id number]) as of 11/24/2016 11:28  Ref. Range 11/22/2016 21:41 11/23/2016 15:33 11/23/2016 17:02 11/23/2016 23:00 11/24/2016 07:50  Glucose-Capillary Latest Ref Range: 65 - 99 mg/dL 409 (H) 811 (H) 914 (H) 186 (H) 216 (H)    Diabetes history: Type 2 diabetes Outpatient Diabetes medications: Levemir 12-16 units bid, Novolog 8 units bid Current orders for Inpatient glycemic control:  Novolog sensitive tid with meals and HS  Inpatient Diabetes Program Recommendations:    Please add Levemir 10 units daily.   Thanks, Beryl Meager, RN, BC-ADM Inpatient Diabetes Coordinator Pager [phone] (8a-5p)

## 2016-11-24 NOTE — Progress Notes (Signed)
PROGRESS NOTE    Tony Mcclure  WGN:[id number] DOB: [birth year] DOA: 11/17/2016 PCP: [name], MD    Brief Narrative:  83 year old male who presented with sepsis. Patient is known to have hypertension, coronary artery disease, diabetes mellitus type 2, chronic kidney disease stage IV, atrial fibrillation on anticoagulation, status post bilateral BKA. Patient was noted to be more confused over last several weeks, positive right arm edema. Due to worsening symptoms he was referred to the hospital for further evaluation. On the initial physical examination, blood pressure 167/86, heart rate 105, temperature 101.4, respiratory rate 30, oxygen saturation 97%. Moist mucous membranes, lungs had decreased breath sounds at bases with bibasilar rales, decreased sounds at the right base, heart S1-S2 present and rhythmic, no gallops, rubs or murmurs, the abdomen was soft nontender, bilateral BKA, positive pitting edema, +++. Urinalysis with to numerous to count white cells. Chest x-ray rotated to the right side, right pleural effusion, increased lung markings bilaterally.  Patient was admitted to the medical unit with the working diagnosis of sepsis due to urinary tract infection, present on admission, complicated by both overload.   Patient placed on furosemide for diuresis, had right thoracentesis, 1200 ml drained. Had episodes of VT suspected to be triggered by hypoxia and electrolyte abnormalities, clinically improving    Assessment & Plan:   Principal Problem:   Sepsis secondary to UTI [facility]) Active Problems:   CAD (coronary artery disease)   Type 2 diabetes mellitus (HCC)   Essential hypertension   S/P bilateral below knee amputation (HCC)   CKD (chronic kidney disease), stage IV (HCC)   Persistent atrial fibrillation (HCC)   Hypokalemia   Pressure injury of skin   1. Sepsis due urinary tract infection, present on admission. On IV ceftriaxone, effective antibiotic therapy  #7.  Persistent hypervolemia, will diuresis with furosemide, to target negative fluid balance.   2. Worsening acute hypoxicrespiratory failure, due to pulmonary edema with right pleural effusion, decompensated diastolic heart failure, acute on chronic.  Inaccurate output measurement, due to poor cooperation, clinically edema has improved, will continue furosemide 60 mg IV q12 hours. Will replete K with cautions, follow renal panel in am. Avoid hypotension or nephrotoxic medications.  3. Paroxysmal atrial fibrillation, with episodic non sustained VT. Continue to have episodic asymptomatic runs of VT, target K of 4 and Mg at 2. To continue to monitor telemetry monitoring, resume anticoagulation with apixaban. Suspected ventricular arrhythmia triggered by hypoxemia, volume overload and hypokalemia.   4. Coronary disease status post CABG. Continueapixaban, no antiplatelet therapy.  5. Chronic kidney disease stage IV. Serum cr down to 1, 68 with serum K at 3,5 and serum bicarbonate at 29. Tolerating well aggressive diuresis.  6. Type 2 diabetes mellitus. On insulin sliding scale for glucose cover and monitoring, capillary glucose 276, 186, 216, 253. Poor oral intake.   7. HTN. Continue with hydralazine, metoprolol, minoxidil, systolic blood pressure 120 to 130 mmHg  8. Large decubitus ulcer, present on admission. 15 cm x 9 cm, with black scar, not able to stage, very poor prognosis. Patient with hypoxemic respiratory failure, episodic VT, not candidate for any surgical intervention, will continue local wound care, air mattress.   9. Severe protein calorie malnutrition. Continue supplements, as tolerated.   DVT prophylaxis:epixaban Code Status:dnr Family Communication:I spoke with patient's daughter at the bedside and explained patient's condition, hypoxic respiratory failure, volume overload, malnutrition, ventricular tachycardia and large decubitus ulcer, poor prognosis and high risk for  rapid deterioration. Will continue supportive medical care  and will focus on patient's comfort and not suffering. When stable will discharge with hospice services.    Disposition Plan:snf with hospice    Consultants:    Procedures:    Antimicrobials:   Zosyn dc 09/13  Ceftriaxone   Subjective: Patient continue to be confused, intermittent respiratory distress, denies any dyspnea or chest pain, no nausea or vomiting. Tolerating po.   Objective: Vitals:   11/24/16 0505 11/24/16 0645 11/24/16 0809 11/24/16 1103  BP: (!) 152/39 126/63 (!) 122/55   Pulse: 80 90 (!) 113   Resp: 19 20    Temp: 98.4 F (36.9 C)     TempSrc: Oral     SpO2: 96%  93% 92%  Weight:      Height:        Intake/Output Summary (Last 24 hours) at 11/24/16 1242 Last data filed at 11/24/16 1610  Gross per 24 hour  Intake                0 ml  Output              500 ml  Net             -500 ml   Filed Weights   11/20/16 0554 11/22/16 0100 11/23/16 0420  Weight: 92.5 kg (203 lb 14.8 oz) 83.9 kg (185 lb) 93.9 kg (207 lb)    Examination:  General: deconditioned and ill looking appearing Neurology: Awake, confused, but not agitated E ENT: mild pallor, no icterus, oral mucosa dry Cardiovascular: No JVD. S1-S2 present, rhythmic, no gallops, rubs, or murmurs. Positive dependent edema ++/+++. Pulmonary:  breath sounds bilaterally at bases, more right than left. Decreased air movement, no wheezing, but scattered rhonchi and rales. Gastrointestinal. Abdomen flat, no organomegaly, non tender, no rebound or guarding Skin. No rashes Musculoskeletal: no joint deformities     Data Reviewed: I have personally reviewed following labs and imaging studies  CBC:  Recent Labs Lab 11/17/16 1850  11/19/16 0936 11/20/16 0432 11/21/16 0734 11/22/16 0505 11/24/16 0410  WBC 13.2*  < > 8.2 8.2 7.2 7.3 6.7  NEUTROABS 10.2*  --   --  6.3 5.4 5.3 4.3  HGB 8.9*  < > 8.4* 8.5* 8.6* 8.0* 7.5*  HCT  27.5*  < > 26.7* 28.1* 27.9* 26.1* 24.1*  MCV 88.4  < > 88.1 88.9 89.7 89.7 90.6  PLT 121*  < > 120* 142* 161 165 212  < > = values in this interval not displayed. Basic Metabolic Panel:  Recent Labs Lab 11/17/16 1850 11/18/16 0738  11/19/16 9604 11/20/16 0432 11/21/16 0734 11/22/16 0505 11/23/16 0721 11/24/16 0410  NA 139 142  < > 143 142 140 145 143 150*  K 2.1* 2.8*  < > 3.8 3.3* 3.1* 3.4* 4.5 3.5  CL 111 111  < > 119* 110 106 113* 110 112*  CO2 20* 21*  < > GLUCOSE 245* 203*  < > 196* 205* 242* 228* 301* 213*  BUN 40* 44*  < > 53* 51* 51* 56* 57* 52*  CREATININE 1.87* 2.04*  < > 2.10* 1.95* 2.11* 2.01* 1.74* 1.68*  CALCIUM 7.2* 7.9*  < > 6.1* 7.8* 7.8* 8.0* 7.8* 8.1*  MG 1.8 2.6*  --  2.4  --  2.2  --  2.2  --   < > = values in this interval not displayed. GFR: Estimated Creatinine Clearance: 33.8 mL/min (A) (by C-G formula based on SCr of 1.68  mg/dL (H)). Liver Function Tests:  Recent Labs Lab 11/17/16 1850 11/18/16 0738 11/18/16 1110 11/21/16 0734 11/22/16 0505  AST 47* 58* 49* 26  --   ALT 25 33 31 39  --   ALKPHOS 68 88 81 91  --   BILITOT 0.7 1.0 0.9 0.9  --   PROT 4.9* 5.4* 5.1* 5.2* 5.2*  ALBUMIN 2.6* 2.7* 2.5* 2.4*  --    No results for input(s): LIPASE, AMYLASE in the last 168 hours. No results for input(s): AMMONIA in the last 168 hours. Coagulation Profile: No results for input(s): INR, PROTIME in the last 168 hours. Cardiac Enzymes: No results for input(s): CKTOTAL, CKMB, CKMBINDEX, TROPONINI in the last 168 hours. BNP (last 3 results) No results for input(s): PROBNP in the last 8760 hours. HbA1C: No results for input(s): HGBA1C in the last 72 hours. CBG:  Recent Labs Lab 11/23/16 1533 11/23/16 1702 11/23/16 2300 11/24/16 0750 11/24/16 1230  GLUCAP 276* 276* 186* 216* 253*   Lipid Profile: No results for input(s): CHOL, HDL, LDLCALC, TRIG, CHOLHDL, LDLDIRECT in the last 72 hours. Thyroid Function Tests: No results  for input(s): TSH, T4TOTAL, FREET4, T3FREE, THYROIDAB in the last 72 hours. Anemia Panel: No results for input(s): VITAMINB12, FOLATE, FERRITIN, TIBC, IRON, RETICCTPCT in the last 72 hours.    Radiology Studies: I have reviewed all of the imaging during this hospital visit personally     Scheduled Meds: . allopurinol  300 mg Oral Daily  . amLODipine  10 mg Oral Daily  . apixaban  2.5 mg Oral BID  . atorvastatin  20 mg Oral q1800  . collagenase   Topical Daily  . feeding supplement (GLUCERNA SHAKE)  237 mL Oral TID BM  . furosemide  60 mg Intravenous BID  . hydrALAZINE  25 mg Oral BID  . insulin aspart  0-5 Units Subcutaneous QHS  . insulin aspart  0-9 Units Subcutaneous TID WC  . ipratropium-albuterol  3 mL Nebulization Q6H  . mouth rinse  15 mL Mouth Rinse BID  . metoprolol tartrate  25 mg Oral BID  . minoxidil  2.5 mg Oral BID  . multivitamin with minerals  1 tablet Oral Daily  . polyethylene glycol  17 g Oral Daily  . senna-docusate  1 tablet Oral QHS   Continuous Infusions:   LOS: 7 days        Layton Tappan Annett Gula, MD [facility] Pager [phone]

## 2016-11-24 NOTE — Progress Notes (Signed)
Patient resting comfortably on [place] with no respiratory distress noted. BIPAP is not needed at this time. RT will monitor as needed.

## 2016-11-24 NOTE — Progress Notes (Signed)
Patient had two runs of 25 seconds run of V.Tach while receiving a bath. Patient asymptomatic and vital signs stable. Bl[place],NP notified. Will continue to monitor and treat per MD orders.

## 2016-11-25 ENCOUNTER — Encounter (HOSPITAL_COMMUNITY): Payer: Self-pay | Admitting: General Surgery

## 2016-11-25 LAB — GLUCOSE, CAPILLARY
GLUCOSE-CAPILLARY: 183 mg/dL — AB (ref 65–99)
GLUCOSE-CAPILLARY: 195 mg/dL — AB (ref 65–99)
Glucose-Capillary: 190 mg/dL — ABNORMAL HIGH (ref 65–99)
Glucose-Capillary: 156 mg/dL — ABNORMAL HIGH (ref 65–99)

## 2016-11-25 LAB — BASIC METABOLIC PANEL
ANION GAP: 9 (ref 5–15)
BUN: 42 mg/dL — AB (ref 6–20)
CHLORIDE: 105 mmol/L (ref 101–111)
CO2: 33 mmol/L — ABNORMAL HIGH (ref 22–32)
Calcium: 8.4 mg/dL — ABNORMAL LOW (ref 8.9–10.3)
Creatinine, Ser: 1.6 mg/dL — ABNORMAL HIGH (ref 0.61–1.24)
GFR, EST AFRICAN AMERICAN: 43 mL/min — AB (ref >60)
GFR, EST NON AFRICAN AMERICAN: 37 mL/min — AB (ref >60)
Glucose, Bld: 211 mg/dL — ABNORMAL HIGH (ref 65–99)
POTASSIUM: 3.1 mmol/L — AB (ref 3.5–5.1)
SODIUM: 147 mmol/L — AB (ref 135–145)

## 2016-11-25 LAB — PH, BODY FLUID: pH, Body Fluid: 7.6

## 2016-11-25 MED ORDER — FUROSEMIDE 40 MG PO TABS
40.0000 mg | ORAL_TABLET | Freq: Every day | ORAL | Status: DC
  Administered 2016-11-26: 40 mg via ORAL
  Filled 2016-11-25: qty 1

## 2016-11-25 MED ORDER — POTASSIUM CHLORIDE CRYS ER 20 MEQ PO TBCR
40.0000 meq | EXTENDED_RELEASE_TABLET | Freq: Once | ORAL | Status: AC
  Administered 2016-11-25: 40 meq via ORAL
  Filled 2016-11-25: qty 2

## 2016-11-25 NOTE — Consult Note (Addendum)
WOC followup: Pt now pursuring comfort care goals and plans to discharge with hospice, according to the EMR. Santyl ointment has already been ordered for enzymatic debridement of nonviable tissue to unstageable sacrum wound which is consistent with this plan of care.  If family members change their mind and aggressive care is desired, then recommend surgical consult for debridement of extensive amt of eschar to this location. Please re-consult if further assistance is needed.  Thank-you,  Cammie Mcgee MSN, RN, CWOCN, [place], CNS [phone]

## 2016-11-25 NOTE — Progress Notes (Signed)
PROGRESS NOTE    Tony Mcclure  NGE:[id number] DOB: 03-25-1928 DOA: 11/17/2016 PCP: [name], MD    Brief Narrative:  83 year old male who presented with sepsis. Patient is known to have hypertension, coronary artery disease, diabetes mellitus type 2, chronic kidney disease stage IV, atrial fibrillation on anticoagulation, status post bilateral BKA. Patient was noted to be more confused over last several weeks, positive right arm edema. Due to worsening symptoms he was referred to the hospital for further evaluation. On the initial physical examination, blood pressure 167/86, heart rate 105, temperature 101.4, respiratory rate 30, oxygen saturation 97%. Moist mucous membranes, lungs had decreased breath sounds at bases with bibasilar rales, decreased sounds at the right base, heart S1-S2 present and rhythmic, no gallops, rubs or murmurs, the abdomen was soft nontender, bilateral BKA, positive pitting edema, +++. Urinalysis with to numerous to count white cells. Chest x-ray rotated to the right side, right pleural effusion, increased lung markings bilaterally.  Patient was admitted to the medical unit with the working diagnosis of sepsis due to urinary tract infection, present on admission, complicated by both overload.   Patient placed on furosemide for diuresis, had right thoracentesis, 1200 ml drained. Had episodes of VT suspected to be triggered by hypoxia and electrolyte abnormalities, clinically improving   Assessment & Plan:   Principal Problem:   Sepsis secondary to UTI [facility]) Active Problems:   CAD (coronary artery disease)   Type 2 diabetes mellitus (HCC)   Essential hypertension   S/P bilateral below knee amputation (HCC)   CKD (chronic kidney disease), stage IV (HCC)   Persistent atrial fibrillation (HCC)   Hypokalemia   Pressure injury of skin   1. Sepsis due urinary tract infection, present on admission. Patient has completed antibiotic therapy for sepsis due  to urine infection, antibiotics have been discontinued.   2. Worsening acute hypoxicrespiratory failure, due to pulmonary edema with right pleural effusion, decompensated diastolic heart failure, acute on chronic. Clinically edema continue to improved, will continue furosemide per oral formulation, to keep negative fluid balance. Severity of edema, also linked to hypoproteinemia and malnutrition. Will continue nutritional support.   3. Paroxysmal atrial fibrillation, with episodic non sustained VT.  No significant arrhythmias on the monitor, will continue telemetry monitoring, keep K close to 4 and Mg at 2. On anticoagulation with apixaban.    4. Coronary disease status post CABG. Patient is chest pain free, will continueapixaban, no antiplatelet therapy.  5. Chronic kidney disease stage IV. Serum cr down to 1, 60 with serum K at 3,1 and serum bicarbonate at 33. Will replete K with caution, will continue po furosemide. Avoid hypotension or nephrotoxic medications.   6. Type 2 diabetes mellitus. Continue insulin sliding scale for glucose cover and monitoring, capillary glucose 253, 163, 168, 190, 183.   7. HTN. On hydralazine, metoprolol, minoxidil, systolic blood pressure 120 to 130 mmHg  8. Large decubitus ulcer, present on admission. 15 cm x 9 cm, with black scar, not able to stage, very poor prognosis. Will continue local wound care, air mattress, not candidate for surgical intervention due to hypoxemic respiratory failure and episodic ventricular tachycardia, discussed with patient's daughter.   9. Severe protein calorie malnutrition. Continue oral supplements, as tolerated.   DVT prophylaxis:epixaban Code Status:dnr Family Communication:no family at the bedside    Disposition Plan:snf with hospice    Consultants:    Procedures:    Antimicrobials:   Zosyn dc 09/13  Ceftriaxone    Subjective: This am patient is  comfortable with no respiratory  distress, no major events overnight, denies and dyspnea or chest pain. Limited hisotory due to confusion. Poor oral intake and generalized weakness.    Objective: Vitals:   11/24/16 2151 11/25/16 0517 11/25/16 0844 11/25/16 1006  BP: 136/89 (!) 144/51 136/62   Pulse: (!) 128 (!) 129 97 70  Resp: Temp: 97.8 F (36.6 C) 98.3 F (36.8 C)    TempSrc:  Oral    SpO2: 93% 95%  96%  Weight:  81.2 kg (179 lb)    Height:        Intake/Output Summary (Last 24 hours) at 11/25/16 1042 Last data filed at 11/25/16 0115  Gross per 24 hour  Intake              180 ml  Output             1000 ml  Net             -820 ml   Filed Weights   11/22/16 0100 11/23/16 0420 11/25/16 0517  Weight: 83.9 kg (185 lb) 93.9 kg (207 lb) 81.2 kg (179 lb)    Examination:  General: deconditioned and ill looking appearing Neurology: Awake and alert, non focal, confused  E ENT: mild pallor, no icterus, oral mucosa moist Cardiovascular: No JVD. S1-S2 present, rhythmic, no gallops, rubs, or murmurs. depending pitting at the lower extremity edema ++. Pulmonary: decreased  breath sounds bilaterally, adequate air movement, no wheezing, scattered rhonchi and rales. Gastrointestinal. Abdomen flat, no organomegaly, non tender, no rebound or guarding Skin. No rashes Musculoskeletal: no joint deformities     Data Reviewed: I have personally reviewed following labs and imaging studies  CBC:  Recent Labs Lab 11/19/16 0936 11/20/16 0432 11/21/16 0734 11/22/16 0505 11/24/16 0410  WBC 8.2 8.2 7.2 7.3 6.7  NEUTROABS  --  6.3 5.4 5.3 4.3  HGB 8.4* 8.5* 8.6* 8.0* 7.5*  HCT 26.7* 28.1* 27.9* 26.1* 24.1*  MCV 88.1 88.9 89.7 89.7 90.6  PLT 120* 142* 161 165 212   Basic Metabolic Panel:  Recent Labs Lab 11/19/16 0635 11/20/16 0432 11/21/16 0734 11/22/16 0505 11/23/16 0721 11/24/16 0410  NA 143 142 140 145 143 150*  K 3.8 3.3* 3.1* 3.4* 4.5 3.5  CL 119* 110 106 113* 110 112*  CO2 GLUCOSE 196* 205* 242* 228* 301* 213*  BUN 53* 51* 51* 56* 57* 52*  CREATININE 2.10* 1.95* 2.11* 2.01* 1.74* 1.68*  CALCIUM 6.1* 7.8* 7.8* 8.0* 7.8* 8.1*  MG 2.4  --  2.2  --  2.2  --    GFR: Estimated Creatinine Clearance: 31.6 mL/min (A) (by C-G formula based on SCr of 1.68 mg/dL (H)). Liver Function Tests:  Recent Labs Lab 11/18/16 1110 11/21/16 0734 11/22/16 0505  AST 49* 26  --   ALT 31 39  --   ALKPHOS 81 91  --   BILITOT 0.9 0.9  --   PROT 5.1* 5.2* 5.2*  ALBUMIN 2.5* 2.4*  --    No results for input(s): LIPASE, AMYLASE in the last 168 hours. No results for input(s): AMMONIA in the last 168 hours. Coagulation Profile: No results for input(s): INR, PROTIME in the last 168 hours. Cardiac Enzymes: No results for input(s): CKTOTAL, CKMB, CKMBINDEX, TROPONINI in the last 168 hours. BNP (last 3 results) No results for input(s): PROBNP in the last 8760 hours. HbA1C: No results for input(s): HGBA1C in the last  72 hours. CBG:  Recent Labs Lab 11/24/16 0750 11/24/16 1230 11/24/16 1644 11/24/16 2154 11/25/16 0808  GLUCAP 216* 253* 163* 168* 190*   Lipid Profile: No results for input(s): CHOL, HDL, LDLCALC, TRIG, CHOLHDL, LDLDIRECT in the last 72 hours. Thyroid Function Tests: No results for input(s): TSH, T4TOTAL, FREET4, T3FREE, THYROIDAB in the last 72 hours. Anemia Panel: No results for input(s): VITAMINB12, FOLATE, FERRITIN, TIBC, IRON, RETICCTPCT in the last 72 hours.    Radiology Studies: I have reviewed all of the imaging during this hospital visit personally     Scheduled Meds: . allopurinol  300 mg Oral Daily  . amLODipine  10 mg Oral Daily  . apixaban  2.5 mg Oral BID  . atorvastatin  20 mg Oral q1800  . collagenase   Topical Daily  . feeding supplement (GLUCERNA SHAKE)  237 mL Oral TID BM  . furosemide  60 mg Intravenous BID  . [START ON 11/26/2016] furosemide  40 mg Oral Daily  . hydrALAZINE  25 mg Oral BID  . insulin aspart  0-5  Units Subcutaneous QHS  . insulin aspart  0-9 Units Subcutaneous TID WC  . ipratropium-albuterol  3 mL Nebulization TID  . mouth rinse  15 mL Mouth Rinse BID  . metoprolol tartrate  25 mg Oral BID  . minoxidil  2.5 mg Oral BID  . multivitamin with minerals  1 tablet Oral Daily  . polyethylene glycol  17 g Oral Daily  . senna-docusate  1 tablet Oral QHS   Continuous Infusions:   LOS: 8 days        Tiffany Calmes Annett Gula, MD [facility] Pager [phone]

## 2016-11-26 LAB — BASIC METABOLIC PANEL
ANION GAP: 8 (ref 5–15)
BUN: 36 mg/dL — ABNORMAL HIGH (ref 6–20)
CO2: 31 mmol/L (ref 22–32)
Calcium: 8.5 mg/dL — ABNORMAL LOW (ref 8.9–10.3)
Chloride: 105 mmol/L (ref 101–111)
Creatinine, Ser: 1.61 mg/dL — ABNORMAL HIGH (ref 0.61–1.24)
GFR, EST AFRICAN AMERICAN: 43 mL/min — AB (ref >60)
GFR, EST NON AFRICAN AMERICAN: 37 mL/min — AB (ref >60)
GLUCOSE: 183 mg/dL — AB (ref 65–99)
POTASSIUM: 3.5 mmol/L (ref 3.5–5.1)
Sodium: 144 mmol/L (ref 135–145)

## 2016-11-26 LAB — CULTURE, BODY FLUID W GRAM STAIN -BOTTLE

## 2016-11-26 LAB — CULTURE, BODY FLUID-BOTTLE: CULTURE: NO GROWTH

## 2016-11-26 LAB — GLUCOSE, CAPILLARY
GLUCOSE-CAPILLARY: 200 mg/dL — AB (ref 65–99)
Glucose-Capillary: 165 mg/dL — ABNORMAL HIGH (ref 65–99)

## 2016-11-26 MED ORDER — FUROSEMIDE 40 MG PO TABS: 40.0000 mg | ORAL_TABLET | Freq: Every day | ORAL | 0 refills | Status: AC

## 2016-11-26 MED ORDER — COLLAGENASE 250 UNIT/GM EX OINT
TOPICAL_OINTMENT | Freq: Every day | CUTANEOUS | 0 refills | Status: AC
Start: 2016-11-26 — End: ?

## 2016-11-26 MED ORDER — GLUCERNA SHAKE PO LIQD: 237.0000 mL | Freq: Three times a day (TID) | ORAL | 0 refills | Status: AC

## 2016-11-26 MED ORDER — ADULT MULTIVITAMIN W/MINERALS CH: 1.0000 | ORAL_TABLET | Freq: Every day | ORAL | 0 refills | Status: AC

## 2016-11-26 MED ORDER — IPRATROPIUM-ALBUTEROL 0.5-2.5 (3) MG/3ML IN SOLN
3.0000 mL | Freq: Three times a day (TID) | RESPIRATORY_TRACT | 0 refills | Status: AC
Start: 2016-11-26 — End: ?

## 2016-11-26 NOTE — Clinical Social Work Placement (Signed)
   CLINICAL SOCIAL WORK PLACEMENT  NOTE  Date:  11/26/2016  Patient Details  Name: Tony Mcclure MRN: [id number] Date of Birth: [birth year]  Clinical Social Work is seeking post-discharge placement for this patient at the Skilled  Nursing Facility level of care (*CSW will initial, date and re-position this form in  chart as items are completed):  Yes   Patient/family provided with [facility] Clinical Social Work Department's list of facilities offering this level of care within the geographic area requested by the patient (or if unable, by the patient's family).  Yes   Patient/family informed of their freedom to choose among providers that offer the needed level of care, that participate in Medicare, Medicaid or managed care program needed by the patient, have an available bed and are willing to accept the patient.  Yes   Patient/family informed of [facility]'s ownership interest in [facility] and [facility], as well as of the fact that they are under no obligation to receive care at these facilities.  PASRR submitted to EDS on 11/18/16     PASRR number received on 11/18/16     Existing PASRR number confirmed on       FL2 transmitted to all facilities in geographic area requested by pt/family on 11/18/16     FL2 transmitted to all facilities within larger geographic area on       Patient informed that his/her managed care company has contracts with or will negotiate with certain facilities, including the following:        Yes   Patient/family informed of bed offers received.  Patient chooses bed at [facility]     Physician recommends and patient chooses bed at      Patient to be transferred to [facility] on 11/26/16.  Patient to be transferred to facility by [facility]     Patient family notified on 11/26/16 of transfer.  Name of family member notified:  Daughter     PHYSICIAN       Additional Comment:     _______________________________________________ Mearl Latin, LCSWA 11/26/2016, 1:26 PM

## 2016-11-26 NOTE — Evaluation (Signed)
Physical Therapy Evaluation Patient Details Name: Tony Mcclure MRN: [id number] DOB: 15-Dec-1928 Today's Date: 11/26/2016   History of Present Illness  pt is an 83 y/o male with pmh significant for HTN, CAD, DM2, stage IV chronic kidney disease, Afib, b/l BKA, recent peumonia, admitted with several weeks of confusion.  Work up led to sepsis likely from UTI and sever hypokalemia.  Clinical Impression  Pt admitted with/for sepsis.  Presently needing significant assist to mobilize and will be limited by a large sacral decubitus, but believe pt can benefit from therapy at SNF for transfer/prosthetic training and there ex.  Pt currently limited functionally due to the problems listed. ( See problems list.).  Will sign off at this time in anticipation of imminent d/c.     Follow Up Recommendations SNF    Equipment Recommendations  None recommended by PT    Recommendations for Other Services       Precautions / Restrictions Precautions Precautions: Fall Restrictions Weight Bearing Restrictions: No      Mobility  Bed Mobility Overal bed mobility: Needs Assistance Bed Mobility: Rolling;Sidelying to Sit;Sit to Supine Rolling: Min assist (x6 for pericare) Sidelying to sit: Max assist   Sit to supine: Mod assist   General bed mobility comments: pt participates throughout,  needs cuing for safe sequencing and truncal assist  Transfers Overall transfer level: Needs assistance   Transfers: Sit to/from Stand Sit to Stand: Total assist         General transfer comment: after donning R prosthesis, pt assisted to stand to clear out all the wet padding.  Pt needed significant assist due to poor prosthetic fit or only R LE and general weakness.  Ambulation/Gait                Stairs            Wheelchair Mobility    Modified Rankin (Stroke Patients Only)       Balance Overall balance assessment: Needs assistance Sitting-balance support: Bilateral upper extremity  supported Sitting balance-Leahy Scale: Fair Sitting balance - Comments: Initially needed assist to maintain sitting EOB with both UE's, but as sitting progressed over 6-8 minutes, pt improved to sitting with 1 UE assist or none for brief periods without assist.                                     Pertinent Vitals/Pain Pain Assessment: Faces Faces Pain Scale: Hurts little more Pain Location: sacral wound area Pain Descriptors / Indicators: Grimacing;Sore Pain Intervention(s): Monitored during session;Repositioned    Home Living Family/patient expects to be discharged to:: Skilled nursing facility Living Arrangements: Children               Additional Comments: recently pt able to transfer to w/c etc with prostheses and ?RW.  Pt is a fairly poor historian, but he did say he spent most of the time in his w/c    Prior Function Level of Independence: Needs assistance   Gait / Transfers Assistance Needed: per past reports, Pt reports he can usually get EOB , put bil prostheses on, stand with RW and get into his WC, he can get to toilet on his own, he preforms a sponge bath and dresses himself.  He reports that he sleeps most of the day in his [facility] because he is up all night.  He doesn't have any appetite.  Hand Dominance        Extremity/Trunk Assessment   Upper Extremity Assessment Upper Extremity Assessment: Generalized weakness    Lower Extremity Assessment Lower Extremity Assessment: Generalized weakness (bil LE's move well to assist with roll and transitions)       Communication   Communication: No difficulties  Cognition Arousal/Alertness: Awake/alert Behavior During Therapy: WFL for tasks assessed/performed Overall Cognitive Status: No family/caregiver present to determine baseline cognitive functioning (not formally assess, but follows commands/participates well)                                        General Comments       Exercises     Assessment/Plan    PT Assessment All further PT needs can be met in the next venue of care  PT Problem List Decreased strength;Decreased activity tolerance;Decreased balance;Decreased mobility;Decreased skin integrity;Pain       PT Treatment Interventions      PT Goals (Current goals can be found in the Care Plan section)  Acute Rehab PT Goals PT Goal Formulation: All assessment and education complete, DC therapy    Frequency     Barriers to discharge        Co-evaluation               AM-PAC PT "6 Clicks" Daily Activity  Outcome Measure Difficulty turning over in bed (including adjusting bedclothes, sheets and blankets)?: Unable Difficulty moving from lying on back to sitting on the side of the bed? : Unable Difficulty sitting down on and standing up from a chair with arms (e.g., wheelchair, bedside commode, etc,.)?: Unable Help needed moving to and from a bed to chair (including a wheelchair)?: Total Help needed walking in hospital room?: Total Help needed climbing 3-5 steps with a railing? : Total 6 Click Score: 6    End of Session   Activity Tolerance: Patient tolerated treatment well;Other (comment) (limited mostly due to pt soaked in urine and needing pericar) Patient left: in bed;with call bell/phone within reach;with bed alarm set;with nursing/sitter in room Nurse Communication: Mobility status PT Visit Diagnosis: Other abnormalities of gait and mobility (R26.89);Pain Pain - part of body:  (sacral decubitus)    Time: 1791-5056 PT Time Calculation (min) (ACUTE ONLY): 32 min   Charges:   PT Evaluation $PT Eval Moderate Complexity: 1 Mod PT Treatments $Therapeutic Activity: 8-22 mins   PT G Codes:        12/19/16  Donnella Sham, PT [phone]  (pager)  Tessie Fass Toshio Slusher 12-19-2016, 12:11 PM

## 2016-11-26 NOTE — Progress Notes (Addendum)
Patient had a pause of 2.95 secs.  Patient is resting quietly.  No distress.  Notify triad on call of the pause. Will continue to monitor the patient

## 2016-11-26 NOTE — Progress Notes (Signed)
Patient will DC to: [facility] Anticipated DC date: 11/26/16 Family notified: Daughter Transport by: Sharin Mons   Per MD patient ready for DC to [facility]. RN, patient, patient's family, and facility notified of DC. Discharge Summary sent to facility. RN given number for report. DC packet on chart. Ambulance transport requested for patient.   CSW signing off.  Cristobal Goldmann, [place] Clinical Social Worker [phone]

## 2016-11-26 NOTE — Discharge Summary (Addendum)
Physician Discharge Summary  Mounir Skipper ZOX:[id number] DOB: 06-01-1928 DOA: 11/17/2016  PCP: [name], MD  Admit date: 11/17/2016 Discharge date: 11/26/2016  Admitted From: SNF Disposition:  SNF with palliative care.  Recommendations for Outpatient Follow-up:  1. Follow up with PCP in 1-week 2. Please obtain BMP in 48 hours, to follow on renal function and electrolytes 3. Patient is been discharged to skilled nursing facility with palliative care 4. Continue local wound care.    Home Health: Na  Equipment/Devices: NA  Discharge Condition: Stable CODE STATUS: DNR  Diet recommendation: Diabetic and cardiac prudent  Brief/Interim Summary: 83 year old male who presented with sepsis. Patient is known to have hypertension, coronary artery disease, diabetes mellitus type 2, chronic kidney disease stage IV, atrial fibrillation on anticoagulation, status post bilateral BKA. Patient was noted to be more confused over last several weeks, positive right arm edema. Due to worsening symptoms he was referred to the hospital for further evaluation. On the initial physical examination, blood pressure 167/86, heart rate 105, temperature 101.4, respiratory rate 30, oxygen saturation 97%. Moist mucous membranes, lungs had decreased breath sounds at bases with bibasilar rales, decreased breath sounds at the right base, heart S1-S2 present, irregulary irregular no gallops, rubs or murmurs, the abdomen was soft nontender, bilateral BKA, positive pitting edema, +++. Urinalysis with to numerous to count white cells. Chest x-ray rotated to the right side, right pleural effusion, increased lung markings bilaterally.  Patient was admitted to the medical unit with the working diagnosis of sepsis due to urinary tract infection, present on admission, complicated by volume overload.    1. Sepsis due to urinary tract infection, present on admission. Patient was admitted to the medical unit, he was placed  on broad spectrum antibiotic therapy, he responded well to medical therapy, he completed a course of IV antibiotics in the hospital with IV cephalosporins.   2. Acute hypoxemic respiratory failure, due to volume overload/ cardiogenic pulmonary edema, complicated by right transudate pleural effusion, in the setting of decompensated acute on chronic diastolic heart failure. Patient was placed on IV furosemide, responded well to aggressive diuresis, negative fluid balance was achieved, with greater than 4,575 ml, of negative fluid balance. Patient underwent ultrasound-guided thoracentesis on the right, obtaining 1200 mL of transudate fluid. His oximetry by the time of discharge is 97% on 4 L per nasal cannula. Limited echocardiography showed systolic LV function of 55-60%, no wall motion abnormalities, wall thickness was increased in the pattern of severe LVH.  Patient will continue metoprolol 25 mg twice daily, furosemide 40 mg daily. No ACE inhibitor due to risk of worsening kidney function.   3.  Paroxysmal atrial fibrillation, complicated by episodic nonsustained ventricular tachycardia. Patient remained in atrial fibrillation rhythm, rate controlled with metoprolol. He received anticoagulation with apixaban. He did have episodes of ventricular tachycardia, which were asymptomatic. Arrhythmia was attributed to hypoxemia, hypokalemia and hypomagnesemia. Once electrolytes were corrected no further ventricular tachycardia noted. Recommend to have close follow-up on serum electrolytes, keep oxygen saturations greater than 92%.   4. Acute kidney injury on chronic kidney disease stage IV. Patient tolerated well aggressive diuresis, his discharge creatinine is 1.6, with a peak serum creatinine of 2.1. Discharge potassium 3.5, serum bicarbonate 31. Will resume diuresis with oral furosemide 40 mg daily, will recommend close follow-up on electrolytes, complete metabolic panel in 48 hours after discharge.   5. Large  decubitus ulcer, present on admission, 15 cm 9 cm with black scar not able to stage, very poor prognosis.  Patient was seen by wound care team, he was placed in a air mattress, local wound care was provided, patient has a poor prognosis due to his severe calorie protein malnutrition, and not ambulatory state. Patient was considered not to be stable for any surgical intervention due to his hypoxemia and episodic ventricular tachycardia. Will recommend to continue conservative management, local wound care, frequent turning, and close follow-up.  6. Type 2 diabetes mellitus. Patient was placed on insulin scale for glucose coverage and monitoring, capillary glucose remained well-controlled.  7. Hypertension. Patient will continue hydralazine, metoprolol, minoxidil.  8. Severe protein calorie malnutrition. Continue nutritional supplements.  Family meeting took place at patient's bedside, patient's condition and prognosis was discussed with his daughter, due to his multiple comorbidities, patient was determined to be a good candidate for palliative care.   Discharge Diagnoses:  Principal Problem:   Sepsis secondary to UTI [facility]) Active Problems:   CAD (coronary artery disease)   Type 2 diabetes mellitus (HCC)   Essential hypertension   S/P bilateral below knee amputation (HCC)   CKD (chronic kidney disease), stage IV (HCC)   Persistent atrial fibrillation (HCC)   Hypokalemia   Pressure injury of skin    Discharge Instructions   Allergies as of 11/26/2016   No Known Allergies     Medication List    STOP taking these medications   LEVEMIR FLEXTOUCH 100 UNIT/ML Pen Generic drug:  Insulin Detemir   polyethylene glycol packet Commonly known as:  MIRALAX / GLYCOLAX   potassium chloride 10 MEQ tablet Commonly known as:  K-DUR,KLOR-CON   simvastatin 40 MG tablet Commonly known as:  ZOCOR     TAKE these medications   allopurinol 300 MG tablet Commonly known as:  ZYLOPRIM Take 0.5  tablets (150 mg total) by mouth daily. What changed:  how much to take   amLODipine 10 MG tablet Commonly known as:  NORVASC Take 10 mg by mouth daily.   apixaban 2.5 MG Tabs tablet Commonly known as:  ELIQUIS Take 1 tablet (2.5 mg total) by mouth 2 (two) times daily.   collagenase ointment Commonly known as:  SANTYL Apply topically daily.   feeding supplement (GLUCERNA SHAKE) Liqd Take 237 mLs by mouth 3 (three) times daily between meals.   furosemide 40 MG tablet Commonly known as:  LASIX Take 1 tablet (40 mg total) by mouth daily. What changed:  medication strength  how much to take  when to take this   hydrALAZINE 25 MG tablet Commonly known as:  APRESOLINE Take 25 mg by mouth 2 (two) times daily. What changed:  Another medication with the same name was removed. Continue taking this medication, and follow the directions you see here.   hydrocortisone 2.5 % rectal cream Commonly known as:  ANUSOL-HC Place rectally 2 (two) times daily as needed for hemorrhoids or itching.   insulin aspart 100 UNIT/ML FlexPen Commonly known as:  NOVOLOG Inject 8 Units into the skin 2 (two) times daily.   ipratropium-albuterol 0.5-2.5 (3) MG/3ML Soln Commonly known as:  DUONEB Take 3 mLs by nebulization 3 (three) times daily.   metoprolol tartrate 25 MG tablet Commonly known as:  LOPRESSOR Take 1 tablet (25 mg total) by mouth 2 (two) times daily.   minoxidil 2.5 MG tablet Commonly known as:  LONITEN Take 2.5 mg by mouth 2 (two) times daily.   multivitamin with minerals Tabs tablet Take 1 tablet by mouth daily.   senna-docusate 8.6-50 MG tablet Commonly known as:  Senokot-S Take  1 tablet by mouth at bedtime.            Discharge Care Instructions        Start     Ordered   11/26/16 0000  collagenase (SANTYL) ointment  Daily     11/26/16 1009   11/26/16 0000  feeding supplement, GLUCERNA SHAKE, (GLUCERNA SHAKE) LIQD  3 times daily between meals     11/26/16 1009    11/26/16 0000  ipratropium-albuterol (DUONEB) 0.5-2.5 (3) MG/3ML SOLN  3 times daily     11/26/16 1009   11/26/16 0000  Multiple Vitamin (MULTIVITAMIN WITH MINERALS) TABS tablet  Daily     11/26/16 1009   11/26/16 0000  furosemide (LASIX) 40 MG tablet  Daily     11/26/16 1009   11/26/16 0000  Increase activity slowly     11/26/16 1009   11/26/16 0000  Diet - low sodium heart healthy     11/26/16 1009   11/26/16 0000  Discharge instructions    Comments:  Please follow with primary care in 7 days.   11/26/16 1009      No Known Allergies  Consultations:  Wound care team.    Procedures/Studies: Dg Chest 1 View  Result Date: 11/23/2016 CLINICAL DATA:  Dyspnea. EXAM: CHEST 1 VIEW COMPARISON:  11/21/2016 FINDINGS: Prior median sternotomy. Patient moderately rotated to the right. Midline trachea. Moderate cardiomegaly. Increase in moderate right pleural effusion. No pneumothorax. Progression mild-to-moderate interstitial edema. Aortic atherosclerosis. Worsened right mid and lower lung airspace disease. IMPRESSION: Overall worsened aeration, with progression interstitial edema, right-sided airspace disease, and right-sided pleural fluid. No pneumothorax. Electronically Signed   By: Jeronimo Greaves M.D.   On: 11/23/2016 08:36   Ct Head Wo Contrast  Result Date: 11/17/2016 CLINICAL DATA:  Slurred speech and right facial droop. EXAM: CT HEAD WITHOUT CONTRAST TECHNIQUE: Contiguous axial images were obtained from the base of the skull through the vertex without intravenous contrast. COMPARISON:  None. FINDINGS: Examination limited by patient motion and positioning. Brain: Age related cerebral atrophy, ventriculomegaly and periventricular white matter disease. No extra-axial fluid collections are identified. No CT findings for acute hemispheric infarction or intracranial hemorrhage. No mass lesions. The brainstem and cerebellum are normal. Vascular: No worrisome vascular calcifications or hyperdense  vessels. Skull: No obvious skull fracture or bone lesion. Sinuses/Orbits: The paranasal sinuses and mastoid air cells are grossly clear. Lobes are grossly intact. Other: No obvious scalp lesion or hematoma. IMPRESSION: Very limited examination due to patient motion. Age related cerebral atrophy, ventriculomegaly and periventricular white matter disease. No obvious acute intracranial process. Electronically Signed   By: Rudie Meyer M.D.   On: 11/17/2016 20:32   Dg Chest Port 1 View  Result Date: 11/24/2016 CLINICAL DATA:  Patient status post right-sided thoracentesis. EXAM: PORTABLE CHEST 1 VIEW COMPARISON:  Chest radiograph 11/21/2016 FINDINGS: Patient is rotated to the right. Stable enlarged cardiac and mediastinal contours status post median sternotomy. Monitoring leads overlie the patient. Interval decrease in size of now small right pleural effusion. Residual right basilar atelectasis. Interstitial opacities throughout the left-greater-than-right lungs. No definite right-sided pneumothorax. IMPRESSION: No definite right-sided pneumothorax. Interval decrease in size of now small right pleural effusion. Interstitial opacities suggestive of edema. Electronically Signed   By: Annia Belt M.D.   On: 11/24/2016 07:40   Dg Chest Port 1 View  Result Date: 11/21/2016 CLINICAL DATA:  Shortness of breath, vomiting last night question aspiration ; history hypertension, coronary artery disease post CABG, type II diabetes mellitus,  atrial fibrillation EXAM: PORTABLE CHEST 1 VIEW COMPARISON:  Portable exam 0653 hours compared to 11/20/2016 FINDINGS: Enlargement of cardiac silhouette post CABG. Atherosclerotic calcification aorta. Mediastinal contours and pulmonary vascularity otherwise normal. Mildly increased RIGHT pleural effusion and atelectasis. Mild scattered interstitial infiltrates likely representing mild pulmonary edema. No pneumothorax. Bones demineralized. IMPRESSION: Mild persistent pulmonary edema with  mildly increased RIGHT pleural effusion and atelectasis. Electronically Signed   By: Ulyses Southward M.D.   On: 11/21/2016 07:38   Dg Chest Port 1 View  Result Date: 11/20/2016 CLINICAL DATA:  Shortness of Breath EXAM: PORTABLE CHEST 1 VIEW COMPARISON:  November 19, 2016 FINDINGS: There is interstitial pulmonary edema with right pleural effusion. There is airspace consolidation in the medial right base. There is cardiomegaly with pulmonary venous hypertension. There is aortic atherosclerosis. Patient is status post coronary artery bypass grafting. No evident adenopathy. Bones are osteoporotic. There is an old healed fracture of the right mid clavicle with postoperative change. IMPRESSION: Findings indicative of congestive heart failure, similar to 1 day prior. Consolidation medial right base may represent alveolar edema, although superimposed pneumonia in the medial right base cannot be excluded must be of concern. Both edema and pneumonia may present concurrently. There is aortic atherosclerosis. Patient is status post coronary artery bypass grafting. Aortic Atherosclerosis (ICD10-I70.0). Electronically Signed   By: Bretta Bang III M.D.   On: 11/20/2016 10:28   Dg Chest Port 1 View  Result Date: 11/19/2016 CLINICAL DATA:  Shortness of breath. EXAM: PORTABLE CHEST 1 VIEW COMPARISON:  Radiograph of November 18, 2016. FINDINGS: Stable cardiomegaly. Status post coronary artery bypass graft. Aortic atherosclerosis. Stable central pulmonary vascular congestion is noted with bilateral diffuse interstitial densities concerning for pulmonary edema. Stable right basilar atelectasis or infiltrate is noted with associated pleural effusion. Bony thorax is unremarkable. IMPRESSION: Aortic atherosclerosis. Stable cardiomegaly and central pulmonary vascular congestion with bilateral pulmonary edema. Stable right basilar atelectasis or infiltrate is noted with associated pleural effusion. Electronically Signed   By:  Lupita Raider, M.D.   On: 11/19/2016 07:40   Dg Chest Port 1 View  Result Date: 11/18/2016 CLINICAL DATA:  Hypoxia EXAM: PORTABLE CHEST 1 VIEW COMPARISON:  11/17/2016 FINDINGS: Prior CABG. There is cardiomegaly with vascular congestion and bilateral airspace opacities, diffuse on the right and perihilar and lower lobe on the left. This most likely reflects asymmetric edema. Possible small right effusion. IMPRESSION: Cardiomegaly with vascular congestion and asymmetric airspace disease, right greater than left, most likely asymmetric edema/ CHF. Possible small right effusion. Electronically Signed   By: Charlett Nose M.D.   On: 11/18/2016 11:04   Dg Chest Portable 1 View  Result Date: 11/17/2016 CLINICAL DATA:  Sepsis. EXAM: PORTABLE CHEST 1 VIEW COMPARISON:  6 stent 1,018 FINDINGS: The heart is mildly enlarged but stable. Stable tortuosity, ectasia and calcification of the thoracic aorta. Stable eventration of the right hemidiaphragm. There is overlying right-sided pleural effusion and right lower lobe atelectasis or infiltrate. No left-sided effusion or infiltrate. IMPRESSION: Right-sided pleural effusion and overlying atelectasis or infiltrate. Mild vascular congestion but no overt pulmonary edema. Electronically Signed   By: Rudie Meyer M.D.   On: 11/17/2016 20:04   Dg Abd Portable 1v  Result Date: 11/20/2016 CLINICAL DATA:  Abdominal pain EXAM: PORTABLE ABDOMEN - 1 VIEW COMPARISON:  08/14/2016 FINDINGS: Scattered large and small bowel gas is noted. No obstructive changes are seen. No free air is noted. Multilevel degenerative changes in the thoracic and lumbar spine are seen. No soft tissue  abnormality is noted. IMPRESSION: No acute abnormality noted. Electronically Signed   By: Alcide Clever M.D.   On: 11/20/2016 07:39   Ir Thoracentesis Asp Pleural Space W/img Guide  Result Date: 11/25/2016 INDICATION: Decompensated diastolic heart failure with acute hypoxia. Request is made for diagnostic  and therapeutic thoracentesis. EXAM: ULTRASOUND GUIDED DIAGNOSTIC AND THERAPEUTIC THORACENTESIS MEDICATIONS: 1% xylocaine COMPLICATIONS: None immediate. PROCEDURE: An ultrasound guided thoracentesis was thoroughly discussed with the patient and questions answered. The benefits, risks, alternatives and complications were also discussed. The patient understands and wishes to proceed with the procedure. Written consent was obtained. Ultrasound was performed to localize and mark an adequate pocket of fluid in the right chest. The area was then prepped and draped in the normal sterile fashion. 1% xylocaine was used for local anesthesia. Under ultrasound guidance a Safe-T-Centesis catheter was introduced. Thoracentesis was performed. The catheter was removed and a dressing applied. FINDINGS: A total of approximately 1.2 L of serous fluid was removed. Samples were sent to the laboratory as requested by the clinical team. IMPRESSION: Successful ultrasound guided right thoracentesis yielding 1.2 L of pleural fluid. Read by: Barnetta Chapel, PA-C Electronically Signed   By: Irish Lack M.D.   On: 11/25/2016 14:23       Subjective: Patient feeling better, no nausea or vomiting, no dyspnea or chest pain. Remains confused but no agitation.   Discharge Exam: Vitals:   11/25/16 2133 11/26/16 0457  BP: 129/63 (!) 134/48  Pulse: 89 77  Resp: (!) 21 (!) 22  Temp: 98.2 F (36.8 C) 97.6 F (36.4 C)  SpO2: 93% 90%   Vitals:   11/25/16 1442 11/25/16 2003 11/25/16 2133 11/26/16 0457  BP:   129/63 (!) 134/48  Pulse: 89  89 77  Resp: 18  (!) 21 (!) 22  Temp:   98.2 F (36.8 C) 97.6 F (36.4 C)  TempSrc:   Oral Oral  SpO2: 92% 93% 93% 90%  Weight:    79.8 kg (176 lb)  Height:        General: Pt is alert, awake, not in acute distress E ENT: mild pallor, no icterus, oral mucosa moist Cardiovascular: RRR, S1/S2 +, no rubs, no gallops Respiratory: CTA bilaterally, no wheezing, no rhonchi Abdominal: Soft,  NT, ND, bowel sounds + Extremities: pitting ++ edema, no cyanosis Skin: large decubitus ulcer, black scar with dressing in place    The results of significant diagnostics from this hospitalization (including imaging, microbiology, ancillary and laboratory) are listed below for reference.     Microbiology: Recent Results (from the past 240 hour(s))  Blood Culture (routine x 2)     Status: None   Collection Time: 11/17/16  5:08 PM  Result Value Ref Range Status   Specimen Description BLOOD BLOOD RIGHT FOREARM  Final   Special Requests IN PEDIATRIC BOTTLE Blood Culture adequate volume  Final   Culture NO GROWTH 5 DAYS  Final   Report Status 11/22/2016 FINAL  Final  Blood Culture (routine x 2)     Status: None   Collection Time: 11/17/16  5:55 PM  Result Value Ref Range Status   Specimen Description BLOOD BLOOD LEFT FOREARM  Final   Special Requests IN PEDIATRIC BOTTLE Blood Culture adequate volume  Final   Culture NO GROWTH 5 DAYS  Final   Report Status 11/22/2016 FINAL  Final  Gram stain     Status: None   Collection Time: 11/21/16  3:31 PM  Result Value Ref Range Status   Specimen  Description THORACENTESIS  Final   Special Requests NONE  Final   Gram Stain   Final    FEW WBC PRESENT,BOTH PMN AND MONONUCLEAR NO ORGANISMS SEEN    Report Status 11/21/2016 FINAL  Final  Culture, body fluid-bottle     Status: None (Preliminary result)   Collection Time: 11/21/16  3:31 PM  Result Value Ref Range Status   Specimen Description THORACENTESIS  Final   Special Requests NONE  Final   Culture NO GROWTH 4 DAYS  Final   Report Status PENDING  Incomplete     Labs: BNP (last 3 results)  Recent Labs  08/11/16 1727 11/17/16 1850  BNP 907.9* 702.1*   Basic Metabolic Panel:  Recent Labs Lab 11/21/16 0734 11/22/16 0505 11/23/16 0721 11/24/16 0410 11/25/16 1040 11/26/16 0350  NA 140 145 143 150* 147* 144  K 3.1* 3.4* 4.5 3.5 3.1* 3.5  CL 106 113* 110 112* 105 105  CO2 33* 31  GLUCOSE 242* 228* 301* 213* 211* 183*  BUN 51* 56* 57* 52* 42* 36*  CREATININE 2.11* 2.01* 1.74* 1.68* 1.60* 1.61*  CALCIUM 7.8* 8.0* 7.8* 8.1* 8.4* 8.5*  MG 2.2  --  2.2  --   --   --    Liver Function Tests:  Recent Labs Lab 11/21/16 0734 11/22/16 0505  AST 26  --   ALT 39  --   ALKPHOS 91  --   BILITOT 0.9  --   PROT 5.2* 5.2*  ALBUMIN 2.4*  --    No results for input(s): LIPASE, AMYLASE in the last 168 hours. No results for input(s): AMMONIA in the last 168 hours. CBC:  Recent Labs Lab 11/19/16 0936 11/20/16 0432 11/21/16 0734 11/22/16 0505 11/24/16 0410  WBC 8.2 8.2 7.2 7.3 6.7  NEUTROABS  --  6.3 5.4 5.3 4.3  HGB 8.4* 8.5* 8.6* 8.0* 7.5*  HCT 26.7* 28.1* 27.9* 26.1* 24.1*  MCV 88.1 88.9 89.7 89.7 90.6  PLT 120* 142* 161 165 212   Cardiac Enzymes: No results for input(s): CKTOTAL, CKMB, CKMBINDEX, TROPONINI in the last 168 hours. BNP: Invalid input(s): POCBNP CBG:  Recent Labs Lab 11/25/16 0808 11/25/16 1208 11/25/16 1719 11/25/16 2130 11/26/16 0815  GLUCAP 190* 183* 195* 156* 165*   D-Dimer No results for input(s): DDIMER in the last 72 hours. Hgb A1c No results for input(s): HGBA1C in the last 72 hours. Lipid Profile No results for input(s): CHOL, HDL, LDLCALC, TRIG, CHOLHDL, LDLDIRECT in the last 72 hours. Thyroid function studies No results for input(s): TSH, T4TOTAL, T3FREE, THYROIDAB in the last 72 hours.  Invalid input(s): FREET3 Anemia work up No results for input(s): VITAMINB12, FOLATE, FERRITIN, TIBC, IRON, RETICCTPCT in the last 72 hours. Urinalysis    Component Value Date/Time   COLORURINE YELLOW 11/17/2016 1910   APPEARANCEUR CLOUDY (A) 11/17/2016 1910   LABSPEC 1.010 11/17/2016 1910   PHURINE 5.0 11/17/2016 1910   GLUCOSEU NEGATIVE 11/17/2016 1910   HGBUR LARGE (A) 11/17/2016 1910   BILIRUBINUR NEGATIVE 11/17/2016 1910   KETONESUR NEGATIVE 11/17/2016 1910   PROTEINUR 100 (A) 11/17/2016 1910   NITRITE  NEGATIVE 11/17/2016 1910   LEUKOCYTESUR LARGE (A) 11/17/2016 1910   Sepsis Labs Invalid input(s): PROCALCITONIN,  WBC,  LACTICIDVEN Microbiology Recent Results (from the past 240 hour(s))  Blood Culture (routine x 2)     Status: None   Collection Time: 11/17/16  5:08 PM  Result Value Ref Range Status   Specimen Description BLOOD BLOOD RIGHT  FOREARM  Final   Special Requests IN PEDIATRIC BOTTLE Blood Culture adequate volume  Final   Culture NO GROWTH 5 DAYS  Final   Report Status 11/22/2016 FINAL  Final  Blood Culture (routine x 2)     Status: None   Collection Time: 11/17/16  5:55 PM  Result Value Ref Range Status   Specimen Description BLOOD BLOOD LEFT FOREARM  Final   Special Requests IN PEDIATRIC BOTTLE Blood Culture adequate volume  Final   Culture NO GROWTH 5 DAYS  Final   Report Status 11/22/2016 FINAL  Final  Gram stain     Status: None   Collection Time: 11/21/16  3:31 PM  Result Value Ref Range Status   Specimen Description THORACENTESIS  Final   Special Requests NONE  Final   Gram Stain   Final    FEW WBC PRESENT,BOTH PMN AND MONONUCLEAR NO ORGANISMS SEEN    Report Status 11/21/2016 FINAL  Final  Culture, body fluid-bottle     Status: None (Preliminary result)   Collection Time: 11/21/16  3:31 PM  Result Value Ref Range Status   Specimen Description THORACENTESIS  Final   Special Requests NONE  Final   Culture NO GROWTH 4 DAYS  Final   Report Status PENDING  Incomplete     Time coordinating discharge: 45 minutes  SIGNED:   Coralie Keens, MD  [facility] 11/26/2016, 8:56 AM Pager [phone]  If 7PM-7AM, please contact night-coverage [email or web address] Password [email or web address]

## 2016-11-27 ENCOUNTER — Encounter: Payer: Self-pay | Admitting: Internal Medicine

## 2016-11-27 ENCOUNTER — Non-Acute Institutional Stay (SKILLED_NURSING_FACILITY): Payer: Medicare Other | Admitting: Internal Medicine

## 2016-11-27 DIAGNOSIS — I10 Essential (primary) hypertension: Secondary | ICD-10-CM

## 2016-11-27 DIAGNOSIS — L89150 Pressure ulcer of sacral region, unstageable: Secondary | ICD-10-CM | POA: Diagnosis not present

## 2016-11-27 DIAGNOSIS — N390 Urinary tract infection, site not specified: Secondary | ICD-10-CM

## 2016-11-27 DIAGNOSIS — N184 Chronic kidney disease, stage 4 (severe): Secondary | ICD-10-CM | POA: Diagnosis not present

## 2016-11-27 DIAGNOSIS — J90 Pleural effusion, not elsewhere classified: Secondary | ICD-10-CM

## 2016-11-27 DIAGNOSIS — N179 Acute kidney failure, unspecified: Secondary | ICD-10-CM

## 2016-11-27 DIAGNOSIS — I5031 Acute diastolic (congestive) heart failure: Secondary | ICD-10-CM

## 2016-11-27 DIAGNOSIS — I480 Paroxysmal atrial fibrillation: Secondary | ICD-10-CM

## 2016-11-27 DIAGNOSIS — E43 Unspecified severe protein-calorie malnutrition: Secondary | ICD-10-CM

## 2016-11-27 DIAGNOSIS — I4729 Other ventricular tachycardia: Secondary | ICD-10-CM

## 2016-11-27 DIAGNOSIS — I472 Ventricular tachycardia: Secondary | ICD-10-CM | POA: Diagnosis not present

## 2016-11-27 DIAGNOSIS — A419 Sepsis, unspecified organism: Secondary | ICD-10-CM

## 2016-11-27 DIAGNOSIS — J9601 Acute respiratory failure with hypoxia: Secondary | ICD-10-CM

## 2016-11-27 NOTE — Progress Notes (Signed)
: Provider:  Randon Goldsmith. [name], MD Location:  [facility] Room Number: [phone] Place of Service:  SNF [phone])  PCP: [name], MD Patient Care Team: [name], MD as PCP - General (Family Medicine)  Extended Emergency Contact Information Primary Emergency Contact: Nimmo,Terri  [place] Home Phone: [phone] Mobile Phone: [phone] Relation: Daughter     Allergies: Patient has no known allergies.  Chief Complaint  Patient presents with  . New Admit To SNF    following hospitalization 11/17/16 to 11/26/16 sepsis due to UTI    HPI: Patient is 83 y.o. male whoLives at home, moves around with the help of a wheelchair, not on home O2, past medical history of hypertension, coronary artery disease, diabetes mellitus 2, chronic kidney disease stage IV, A. fib on Eliquis, bilateral BKA, anemia, recently admitted for pneumonia, who presented to succumb emergency department with more confusion over the past several weeks. He has had right arm swelling for more than a month per his daughter Aurther Loft. In the ED CT of the brain showed no acute process, white blood cell count 13.2, patient noted to be tachypnea and febrile. UA with 28 TNTC. Patient was admitted to [facility] from 9/10-19 for sepsis secondary to UTI. Hospital course was complicated by acute hypoxemic respiratory failure due to volume overload and chronic diastolic congestive heart failure, and paroxysmal atrial fibrillation complicated by episodic nonsustained V. tach. Hospital course was further complicated by acute on chronic kidney disease stage IV and a large decubitus ulcer that was present on admission 15 cm x 9 cm, treated by wound care. Patient was seen by palliative care while in the hospital. Patient is admitted to skilled nursing facility with generalized weakness. While at skilled nursing facility patient will be followed for sacral decubitus ulcer treated  with wound care, hypertension treated with hydralazine and metoprolol and minoxidil, and Norvasc and diabetes mellitus treated with insulin.  Past Medical History:  Diagnosis Date  . Acute diastolic heart failure (HCC)   . Acute respiratory failure with hypoxia (HCC) 08/23/2016  . Atrial fibrillation with slow ventricular response (HCC) 07/28/2016  . BPH (benign prostatic hyperplasia)   . CAD (coronary artery disease)   . CKD (chronic kidney disease), stage III 07/26/2016  . CKD (chronic kidney disease), stage IV (HCC)   . Diabetes mellitus without complication (HCC)   . HCAP (healthcare-associated pneumonia) 08/12/2016  . HLD (hyperlipidemia) 08/23/2016  . Hypertension   . Kidney disease   . Non-sustained ventricular tachycardia (HCC) 08/23/2016  . Normocytic anemia 07/26/2016  . Paroxysmal A-fib (HCC) 07/28/2016  . Persistent atrial fibrillation (HCC) 08/23/2016  . S/P bilateral below knee amputation (HCC) 07/26/2016  . S/P bilateral BKA (below knee amputation) (HCC)   . Sepsis secondary to UTI (HCC) 11/18/2016  . Type 2 diabetes mellitus (HCC) 07/26/2016    Past Surgical History:  Procedure Laterality Date  . BELOW KNEE LEG AMPUTATION Bilateral   . CORONARY ARTERY BYPASS GRAFT    . IR THORACENTESIS ASP PLEURAL SPACE W/IMG GUIDE  11/21/2016  . PROSTATE SURGERY    . SHOULDER SURGERY      Allergies as of 11/27/2016   No Known Allergies     Medication List       Accurate as of 11/27/16 11:28 AM. Always use your most recent med list.          allopurinol 300 MG tablet Commonly known as:  ZYLOPRIM Take 0.5 tablets (150  mg total) by mouth daily.   amLODipine 10 MG tablet Commonly known as:  NORVASC Take 10 mg by mouth daily.   apixaban 2.5 MG Tabs tablet Commonly known as:  ELIQUIS Take 1 tablet (2.5 mg total) by mouth 2 (two) times daily.   collagenase ointment Commonly known as:  SANTYL Apply topically daily.   feeding supplement (GLUCERNA SHAKE) Liqd Take 237 mLs by  mouth 3 (three) times daily between meals.   furosemide 40 MG tablet Commonly known as:  LASIX Take 1 tablet (40 mg total) by mouth daily.   hydrALAZINE 25 MG tablet Commonly known as:  APRESOLINE Take 25 mg by mouth 2 (two) times daily.   hydrocortisone 2.5 % rectal cream Commonly known as:  ANUSOL-HC Place rectally 2 (two) times daily as needed for hemorrhoids or itching.   insulin aspart 100 UNIT/ML FlexPen Commonly known as:  NOVOLOG Inject 8 Units into the skin 2 (two) times daily. With meals   ipratropium-albuterol 0.5-2.5 (3) MG/3ML Soln Commonly known as:  DUONEB Take 3 mLs by nebulization 3 (three) times daily.   metoprolol tartrate 25 MG tablet Commonly known as:  LOPRESSOR Take 1 tablet (25 mg total) by mouth 2 (two) times daily.   minoxidil 2.5 MG tablet Commonly known as:  LONITEN Take 2.5 mg by mouth 2 (two) times daily.   multivitamin with minerals Tabs tablet Take 1 tablet by mouth daily.   senna-docusate 8.6-50 MG tablet Commonly known as:  Senokot-S Take 1 tablet by mouth at bedtime.       No orders of the defined types were placed in this encounter.   Immunization History  Administered Date(s) Administered  . PPD Test 08/18/2016    Social History  Substance Use Topics  . Smoking status: Former Smoker    Years: 20.00  . Smokeless tobacco: Never Used  . Alcohol use No    Family history is   Family History  Problem Relation Age of Onset  . Heart attack Father       Review of Systems  DATA OBTAINED: from patient, nurse GENERAL:  no fevers, fatigue, appetite changes SKIN: No itching, or rash EYES: No eye pain, redness, discharge EARS: No earache, tinnitus, change in hearing NOSE: No congestion, drainage or bleeding  MOUTH/THROAT: No mouth or tooth pain, No sore throat RESPIRATORY: No cough, wheezing, SOB CARDIAC: No chest pain, palpitations, lower extremity edema  GI: No abdominal pain, No N/V/D or constipation, No heartburn or  reflux  GU: No dysuria, frequency or urgency, or incontinence  MUSCULOSKELETAL: No unrelieved bone/joint pain NEUROLOGIC: No headache, dizziness or focal weakness PSYCHIATRIC: No c/o anxiety or sadness   Vitals:   11/27/16 1056  BP: 107/85  Pulse: 71  Resp: (!) 22  Temp: 99.1 F (37.3 C)  SpO2: 96%    SpO2 Readings from Last 1 Encounters:  11/27/16 96%   Body mass index is 28.25 kg/m.     Physical Exam  GENERAL APPEARANCE: Alert, conversant,  No acute distress.  SKIN: No diaphoresis rash HEAD: Normocephalic, atraumatic  EYES: Conjunctiva/lids clear. Pupils round, reactive. EOMs intact.  EARS: External exam WNL, canals clear. Hearing grossly normal.  NOSE: No deformity or discharge.  MOUTH/THROAT: Lips w/o lesions  RESPIRATORY: Breathing is even, unlabored., Slightly increased respiratory rate; Lung sounds are diffusely decreased.  CARDIOVASCULAR: Heart RRR no murmurs, rubs or gallops. No peripheral edema.   GASTROINTESTINAL: Abdomen is soft, non-tender, not distended w/ normal bowel sounds. GENITOURINARY: Bladder non tender, not distended  MUSCULOSKELETAL: No abnormal joints or musculature NEUROLOGIC:  Cranial nerves 2-12 grossly intact. Moves all extremities  PSYCHIATRIC: Mood and affect appropriate to situation, no behavioral issues  Patient Active Problem List   Diagnosis Date Noted  . Sepsis secondary to UTI (HCC) 11/18/2016  . Pressure injury of skin 11/18/2016  . Hypokalemia 11/17/2016  . Acute respiratory failure with hypoxia (HCC) 08/23/2016  . Persistent atrial fibrillation (HCC) 08/23/2016  . Non-sustained ventricular tachycardia (HCC) 08/23/2016  . Situational depression 08/23/2016  . HLD (hyperlipidemia) 08/23/2016  . Palliative care by specialist   . DNR (do not resuscitate)   . Constipation   . Adult failure to thrive   . Acute diastolic heart failure (HCC)   . CKD (chronic kidney disease), stage IV (HCC)   . HCAP (healthcare-associated  pneumonia) 08/12/2016  . Anemia of chronic disease 07/28/2016  . Paroxysmal A-fib (HCC) 07/28/2016  . Atrial fibrillation with slow ventricular response (HCC) 07/28/2016  . Normocytic anemia 07/26/2016  . CAD (coronary artery disease) 07/26/2016  . Type 2 diabetes mellitus (HCC) 07/26/2016  . Essential hypertension 07/26/2016  . S/P bilateral below knee amputation (HCC) 07/26/2016  . Amputee, lower limb (HCC) 11/30/2015      Labs reviewed: Basic Metabolic Panel:    Component Value Date/Time   NA 144 11/26/2016 0350   NA 143 08/22/2016   K 3.5 11/26/2016 0350   CL 105 11/26/2016 0350   CO2 31 11/26/2016 0350   GLUCOSE 183 (H) 11/26/2016 0350   BUN 36 (H) 11/26/2016 0350   BUN 38 (A) 08/22/2016   CREATININE 1.61 (H) 11/26/2016 0350   CALCIUM 8.5 (L) 11/26/2016 0350   PROT 5.2 (L) 11/22/2016 0505   ALBUMIN 2.4 (L) 11/21/2016 0734   AST 26 11/21/2016 0734   ALT 39 11/21/2016 0734   ALKPHOS 91 11/21/2016 0734   BILITOT 0.9 11/21/2016 0734   GFRNONAA 37 (L) 11/26/2016 0350   GFRAA 43 (L) 11/26/2016 0350     Recent Labs  11/19/16 0635  11/21/16 0734  11/23/16 0721 11/24/16 0410 11/25/16 1040 11/26/16 0350  NA 143  < > 140  < > 143 150* 147* 144  K 3.8  < > 3.1*  < > 4.5 3.5 3.1* 3.5  CL 119*  < > 106  < > 110 112* 105 105  CO2 22  < > 25  < > 24 29 33* 31  GLUCOSE 196*  < > 242*  < > 301* 213* 211* 183*  BUN 53*  < > 51*  < > 57* 52* 42* 36*  CREATININE 2.10*  < > 2.11*  < > 1.74* 1.68* 1.60* 1.61*  CALCIUM 6.1*  < > 7.8*  < > 7.8* 8.1* 8.4* 8.5*  MG 2.4  --  2.2  --  2.2  --   --   --   < > = values in this interval not displayed. Liver Function Tests:  Recent Labs  11/18/16 0738 11/18/16 1110 11/21/16 0734 11/22/16 0505  AST 58* 49* 26  --   ALT 33 31 39  --   ALKPHOS 88 81 91  --   BILITOT 1.0 0.9 0.9  --   PROT 5.4* 5.1* 5.2* 5.2*  ALBUMIN 2.7* 2.5* 2.4*  --     Recent Labs  08/11/16 1727  LIPASE 17   No results for input(s): AMMONIA in the  last 8760 hours. CBC:  Recent Labs  11/21/16 0734 11/22/16 0505 11/24/16 0410  WBC 7.2 7.3 6.7  NEUTROABS  5.4 5.3 4.3  HGB 8.6* 8.0* 7.5*  HCT 27.9* 26.1* 24.1*  MCV 89.7 89.7 90.6  PLT 161 165 212   Lipid No results for input(s): CHOL, HDL, LDLCALC, TRIG in the last 8760 hours.  Cardiac Enzymes:  Recent Labs  07/27/16 0921  07/28/16 0143 08/12/16 0346 08/12/16 0839  CKTOTAL 60  --   --   --   --   CKMB 2.7  --   --   --   --   TROPONINI 0.39*  < > 0.36* 0.71*  0.76* 0.76*  < > = values in this interval not displayed. BNP:  Recent Labs  08/11/16 1727 11/17/16 1850  BNP 907.9* 702.1*   No results found for: [facility] Lab Results  Component Value Date   HGBA1C 7.4 (H) 08/12/2016   Lab Results  Component Value Date   TSH 2.957 07/26/2016   Lab Results  Component Value Date   VITAMINB12 285 07/26/2016   Lab Results  Component Value Date   FOLATE 21.5 07/26/2016   Lab Results  Component Value Date   IRON 24 (L) 07/26/2016   TIBC 262 07/26/2016   FERRITIN 222 07/26/2016    Imaging and Procedures obtained prior to SNF admission: Ct Head Wo Contrast  Result Date: 11/17/2016 CLINICAL DATA:  Slurred speech and right facial droop. EXAM: CT HEAD WITHOUT CONTRAST TECHNIQUE: Contiguous axial images were obtained from the base of the skull through the vertex without intravenous contrast. COMPARISON:  None. FINDINGS: Examination limited by patient motion and positioning. Brain: Age related cerebral atrophy, ventriculomegaly and periventricular white matter disease. No extra-axial fluid collections are identified. No CT findings for acute hemispheric infarction or intracranial hemorrhage. No mass lesions. The brainstem and cerebellum are normal. Vascular: No worrisome vascular calcifications or hyperdense vessels. Skull: No obvious skull fracture or bone lesion. Sinuses/Orbits: The paranasal sinuses and mastoid air cells are grossly clear. Lobes are grossly intact.  Other: No obvious scalp lesion or hematoma. IMPRESSION: Very limited examination due to patient motion. Age related cerebral atrophy, ventriculomegaly and periventricular white matter disease. No obvious acute intracranial process. Electronically Signed   By: Rudie Meyer M.D.   On: 11/17/2016 20:32   Dg Chest Port 1 View  Result Date: 11/18/2016 CLINICAL DATA:  Hypoxia EXAM: PORTABLE CHEST 1 VIEW COMPARISON:  11/17/2016 FINDINGS: Prior CABG. There is cardiomegaly with vascular congestion and bilateral airspace opacities, diffuse on the right and perihilar and lower lobe on the left. This most likely reflects asymmetric edema. Possible small right effusion. IMPRESSION: Cardiomegaly with vascular congestion and asymmetric airspace disease, right greater than left, most likely asymmetric edema/ CHF. Possible small right effusion. Electronically Signed   By: Charlett Nose M.D.   On: 11/18/2016 11:04   Dg Chest Portable 1 View  Result Date: 11/17/2016 CLINICAL DATA:  Sepsis. EXAM: PORTABLE CHEST 1 VIEW COMPARISON:  6 stent 1,018 FINDINGS: The heart is mildly enlarged but stable. Stable tortuosity, ectasia and calcification of the thoracic aorta. Stable eventration of the right hemidiaphragm. There is overlying right-sided pleural effusion and right lower lobe atelectasis or infiltrate. No left-sided effusion or infiltrate. IMPRESSION: Right-sided pleural effusion and overlying atelectasis or infiltrate. Mild vascular congestion but no overt pulmonary edema. Electronically Signed   By: Rudie Meyer M.D.   On: 11/17/2016 20:04     Not all labs, radiology exams or other studies done during hospitalization come through on my EPIC note; however they are reviewed by me.    Assessment and Plan  SEPSIS/UTI-patient was covered with broad-spectrum metabolic therapy and completed a course of IV antibiotics with IV cephalosporins SNF - admitted with generalized weakness for OT/PT  ACUTE HYPOXIC RESPIRATORY  FAILURE/ACUTE ON CHRONIC DIASTOLIC HEART FAILURE/CARDIOGENIC PULMONARY EDEMA/caps right TRANSUDATE PLEURAL EFFUSION-patient responded well to aggressive diuresis negative fluid balance was achieved with greater than 4.575 middle of fluid as well as 1200 mL that was tapped from his lung. O2 sat at time of discharge is 90% percent on 4 L nasal cannula, limited echo showed systolic LV function of 55-60% but was severe LVH SNF - plan to continue O2 4 L nasal cannula; continue metoprolol 25 mg twice a day and Lasix 40 mg daily; ace inhibitors due to kidney function; will follow-up BMP  PAROXYSMAL ATRIAL FIBRILLATION/EPISODIC NONSUSTAINED V TACHYCARDIA-patient did have episodes of V. tach which were asymptomatic which were attributed to hypoxemia hypokalemia and hypomagnesemia; correction of electrolytes corrected the V. tach; patient remained in atrial fib with rate control SNF -chronic; plan to continue rate control with Lopressor 25 mg twice a day and prophylaxis with Eliquis 2.5 mg twice a day  ACUTE ON CKD stage IV-kidney tolerated aggressive diuresis well; discharge creatinine is 1.6 SNF - will follow-up BMP  SACRAL DECUBITUS ULCER with eschar unstageable-15 cm x 9 cm; due to poor prognosis and severe protein calorie prepped malnutrition it was felt that this wound would likely never heal; however they're mattress and wound care were performed daily SNF - pain medications for pain; wound care nurse to follow; understanding conservative measures  DM2-patient had good control of his blood sugars with 8 units of NovoLog twice a day SNF - plan to continue NovoLog 8 units subcutaneous twice a day with meals; will check blood sugars before meals  HYPERTENSION SNF - controlled continue Norvasc 10 mg by mouth daily, hydralazine 25 mg twice a day metoprolol 25 mg twice a day and minoxidil 2.5 mg twice a day  SEVERE PROTEIN CALORIE MALNUTRITION SNF - patient will have dietary consult; plan to continue  protein supplements   Time spent greater than 45 minutes;> 50% of time with patient was spent reviewing records, labs, tests and studies, counseling and developing plan of care  Thurston Hole D. [name], MD

## 2016-11-28 ENCOUNTER — Non-Acute Institutional Stay (SKILLED_NURSING_FACILITY): Payer: Medicare Other | Admitting: Internal Medicine

## 2016-11-28 DIAGNOSIS — J9601 Acute respiratory failure with hypoxia: Secondary | ICD-10-CM | POA: Diagnosis not present

## 2016-11-28 DIAGNOSIS — I5031 Acute diastolic (congestive) heart failure: Secondary | ICD-10-CM

## 2016-12-04 ENCOUNTER — Encounter: Payer: Self-pay | Admitting: Internal Medicine

## 2016-12-04 DIAGNOSIS — L89150 Pressure ulcer of sacral region, unstageable: Secondary | ICD-10-CM | POA: Insufficient documentation

## 2016-12-04 DIAGNOSIS — N179 Acute kidney failure, unspecified: Secondary | ICD-10-CM | POA: Insufficient documentation

## 2016-12-04 DIAGNOSIS — E43 Unspecified severe protein-calorie malnutrition: Secondary | ICD-10-CM | POA: Insufficient documentation

## 2016-12-04 DIAGNOSIS — J90 Pleural effusion, not elsewhere classified: Secondary | ICD-10-CM | POA: Insufficient documentation

## 2016-12-04 DIAGNOSIS — N184 Chronic kidney disease, stage 4 (severe): Secondary | ICD-10-CM

## 2016-12-04 NOTE — Progress Notes (Signed)
Location:  [facility] [employer] of Service:  SNF (31) skilled nursing facility  Provider;  Margit Hanks M.D.  Patient Care Team: [name], MD as PCP - General [facility] Medicine)  Extended Emergency Contact Information Primary Emergency Contact: Albertina Parr of [place] Home Phone: [phone] Mobile Phone: [phone] Relation: Daughter    Allergies: Patient has no known allergies.  Chief Complaint  Patient presents with  . Acute Visit    HPI: Patient is 83 y.o. male who Was recently admitted to skilled nursing facility status post sepsis and acute respiratory failure secondary to acute on chronic diastolic congestive heart failure with pleural effusion who I am seeing acutely today for decreased O2 saturation and increased work of breathing. Noted today. No fever no cough no systemic symptoms, no urinary tract symptoms. On exams patient respiratory rate is elevated and his O2 saturation is 93% on 5 L. Patient has no breath sounds on the right side is pleural effusion prior and he probably has a pleural effusion again. Patient is a DO NOT RESUSCITATE, but her daughter wants them sent to the hospital he definitely needs to go.  Past Medical History:  Diagnosis Date  . Acute diastolic heart failure (HCC)   . Acute respiratory failure with hypoxia (HCC) 08/23/2016  . Atrial fibrillation with slow ventricular response (HCC) 07/28/2016  . BPH (benign prostatic hyperplasia)   . CAD (coronary artery disease)   . CKD (chronic kidney disease), stage III 07/26/2016  . CKD (chronic kidney disease), stage IV (HCC)   . Diabetes mellitus without complication (HCC)   . HCAP (healthcare-associated pneumonia) 08/12/2016  . HLD (hyperlipidemia) 08/23/2016  . Hypertension   . Kidney disease   . Non-sustained ventricular tachycardia (HCC) 08/23/2016  . Normocytic anemia 07/26/2016  . Paroxysmal A-fib (HCC) 07/28/2016  . Persistent atrial  fibrillation (HCC) 08/23/2016  . S/P bilateral below knee amputation (HCC) 07/26/2016  . S/P bilateral BKA (below knee amputation) (HCC)   . Sepsis secondary to UTI (HCC) 11/18/2016  . Type 2 diabetes mellitus (HCC) 07/26/2016    Past Surgical History:  Procedure Laterality Date  . BELOW KNEE LEG AMPUTATION Bilateral   . CORONARY ARTERY BYPASS GRAFT    . IR THORACENTESIS ASP PLEURAL SPACE W/IMG GUIDE  11/21/2016  . PROSTATE SURGERY    . SHOULDER SURGERY      Allergies as of 11/28/2016   No Known Allergies     Medication List       Accurate as of 11/28/16 11:59 PM. Always use your most recent med list.          allopurinol 300 MG tablet Commonly known as:  ZYLOPRIM Take 0.5 tablets (150 mg total) by mouth daily.   amLODipine 10 MG tablet Commonly known as:  NORVASC Take 10 mg by mouth daily.   apixaban 2.5 MG Tabs tablet Commonly known as:  ELIQUIS Take 1 tablet (2.5 mg total) by mouth 2 (two) times daily.   collagenase ointment Commonly known as:  SANTYL Apply topically daily.   feeding supplement (GLUCERNA SHAKE) Liqd Take 237 mLs by mouth 3 (three) times daily between meals.   furosemide 40 MG tablet Commonly known as:  LASIX Take 1 tablet (40 mg total) by mouth daily.   hydrALAZINE 25 MG tablet Commonly known as:  APRESOLINE Take 25 mg by mouth 2 (two) times daily.   hydrocortisone 2.5 % rectal cream Commonly known as:  ANUSOL-HC Place rectally 2 (two) times  daily as needed for hemorrhoids or itching.   insulin aspart 100 UNIT/ML FlexPen Commonly known as:  NOVOLOG Inject 8 Units into the skin 2 (two) times daily. With meals   ipratropium-albuterol 0.5-2.5 (3) MG/3ML Soln Commonly known as:  DUONEB Take 3 mLs by nebulization 3 (three) times daily.   metoprolol tartrate 25 MG tablet Commonly known as:  LOPRESSOR Take 1 tablet (25 mg total) by mouth 2 (two) times daily.   minoxidil 2.5 MG tablet Commonly known as:  LONITEN Take 2.5 mg by mouth 2  (two) times daily.   multivitamin with minerals Tabs tablet Take 1 tablet by mouth daily.   senna-docusate 8.6-50 MG tablet Commonly known as:  Senokot-S Take 1 tablet by mouth at bedtime.       No orders of the defined types were placed in this encounter.   Immunization History  Administered Date(s) Administered  . PPD Test 08/18/2016    Social History  Substance Use Topics  . Smoking status: Former Smoker    Years: 20.00  . Smokeless tobacco: Never Used  . Alcohol use No    Review of Systems  DATA OBTAINED: from Nursing-as per history of present illness GENERAL:  no fevers, fatigue, appetite changes SKIN: No itching, rash HEENT: No complaint RESPIRATORY: No cough, wheezing, +SOB CARDIAC: No chest pain, palpitations, lower extremity edema  GI: No abdominal pain, No N/V/D or constipation, No heartburn or reflux  GU: No dysuria, frequency or urgency, or incontinence  MUSCULOSKELETAL: No unrelieved bone/joint pain NEUROLOGIC: No headache, dizziness  PSYCHIATRIC: No overt anxiety or sadness  Vitals:   12/04/16 1404  BP: 138/80  Pulse: 78  Resp: (!) 32  Temp: 97.9 F (36.6 C)   There is no height or weight on file to calculate BMI. Physical Exam  GENERAL APPEARANCE: Alert, Moderately conversant, + acute distress  SKIN: Decubitus stressed, not visualized HEENT: Unremarkable RESPIRATORY: Breathing is even, labored, increased respiratory rate. Lung sounds are absent on the right, present on the left; O2 saturation at bedside on 5 L is 93%   CARDIOVASCULAR: Heart RRR no murmurs, rubs or gallops. No peripheral edema  GASTROINTESTINAL: Abdomen is soft, non-tender, not distended w/ normal bowel sounds.  GENITOURINARY: Bladder non tender, not distended  MUSCULOSKELETAL: Bilateral BKA NEUROLOGIC: Cranial nerves 2-12 grossly intact. Moves all extremities PSYCHIATRIC: Mood and affect appropriate to situation, no behavioral issues  Patient Active Problem List    Diagnosis Date Noted  . Pleural effusion on right 12/04/2016  . Acute renal failure superimposed on stage 4 chronic kidney disease (HCC) 12/04/2016  . Decubitus ulcer of sacral region, unstageable (HCC) 12/04/2016  . Severe protein-calorie malnutrition (HCC) 12/04/2016  . Sepsis secondary to UTI (HCC) 11/18/2016  . Pressure injury of skin 11/18/2016  . Hypokalemia 11/17/2016  . Acute respiratory failure with hypoxia (HCC) 08/23/2016  . Persistent atrial fibrillation (HCC) 08/23/2016  . Non-sustained ventricular tachycardia (HCC) 08/23/2016  . Situational depression 08/23/2016  . HLD (hyperlipidemia) 08/23/2016  . Palliative care by specialist   . DNR (do not resuscitate)   . Constipation   . Adult failure to thrive   . Acute diastolic heart failure (HCC)   . CKD (chronic kidney disease), stage IV (HCC)   . HCAP (healthcare-associated pneumonia) 08/12/2016  . Anemia of chronic disease 07/28/2016  . Paroxysmal A-fib (HCC) 07/28/2016  . Atrial fibrillation with slow ventricular response (HCC) 07/28/2016  . Normocytic anemia 07/26/2016  . CAD (coronary artery disease) 07/26/2016  . Type 2 diabetes mellitus (HCC)  07/26/2016  . Essential hypertension 07/26/2016  . S/P bilateral below knee amputation (HCC) 07/26/2016  . Amputee, lower limb (HCC) 11/30/2015    CMP     Component Value Date/Time   NA 144 11/26/2016 0350   NA 143 08/22/2016   K 3.5 11/26/2016 0350   CL 105 11/26/2016 0350   CO2 31 11/26/2016 0350   GLUCOSE 183 (H) 11/26/2016 0350   BUN 36 (H) 11/26/2016 0350   BUN 38 (A) 08/22/2016   CREATININE 1.61 (H) 11/26/2016 0350   CALCIUM 8.5 (L) 11/26/2016 0350   PROT 5.2 (L) 11/22/2016 0505   ALBUMIN 2.4 (L) 11/21/2016 0734   AST 26 11/21/2016 0734   ALT 39 11/21/2016 0734   ALKPHOS 91 11/21/2016 0734   BILITOT 0.9 11/21/2016 0734   GFRNONAA 37 (L) 11/26/2016 0350   GFRAA 43 (L) 11/26/2016 0350    Recent Labs  11/19/16 0635  11/21/16 0734  11/23/16 0721  11/24/16 0410 11/25/16 1040 11/26/16 0350  NA 143  < > 140  < > 143 150* 147* 144  K 3.8  < > 3.1*  < > 4.5 3.5 3.1* 3.5  CL 119*  < > 106  < > 110 112* 105 105  CO2 22  < > 25  < > 24 29 33* 31  GLUCOSE 196*  < > 242*  < > 301* 213* 211* 183*  BUN 53*  < > 51*  < > 57* 52* 42* 36*  CREATININE 2.10*  < > 2.11*  < > 1.74* 1.68* 1.60* 1.61*  CALCIUM 6.1*  < > 7.8*  < > 7.8* 8.1* 8.4* 8.5*  MG 2.4  --  2.2  --  2.2  --   --   --   < > = values in this interval not displayed.  Recent Labs  11/18/16 0738 11/18/16 1110 11/21/16 0734 11/22/16 0505  AST 58* 49* 26  --   ALT 33 31 39  --   ALKPHOS 88 81 91  --   BILITOT 1.0 0.9 0.9  --   PROT 5.4* 5.1* 5.2* 5.2*  ALBUMIN 2.7* 2.5* 2.4*  --     Recent Labs  11/21/16 0734 11/22/16 0505 11/24/16 0410  WBC 7.2 7.3 6.7  NEUTROABS 5.4 5.3 4.3  HGB 8.6* 8.0* 7.5*  HCT 27.9* 26.1* 24.1*  MCV 89.7 89.7 90.6  PLT 161 165 212   No results for input(s): CHOL, LDLCALC, TRIG in the last 8760 hours.  Invalid input(s): HCL No results found for: MICROALBUR Lab Results  Component Value Date   TSH 2.957 07/26/2016   Lab Results  Component Value Date   HGBA1C 7.4 (H) 08/12/2016   No results found for: CHOL, HDL, LDLCALC, LDLDIRECT, TRIG, CHOLHDL  Significant Diagnostic Results in last 30 days:  Dg Chest 1 View  Result Date: 11/23/2016 CLINICAL DATA:  Dyspnea. EXAM: CHEST 1 VIEW COMPARISON:  11/21/2016 FINDINGS: Prior median sternotomy. Patient moderately rotated to the right. Midline trachea. Moderate cardiomegaly. Increase in moderate right pleural effusion. No pneumothorax. Progression mild-to-moderate interstitial edema. Aortic atherosclerosis. Worsened right mid and lower lung airspace disease. IMPRESSION: Overall worsened aeration, with progression interstitial edema, right-sided airspace disease, and right-sided pleural fluid. No pneumothorax. Electronically Signed   By: Jeronimo Greaves M.D.   On: 11/23/2016 08:36   Ct Head Wo  Contrast  Result Date: 11/17/2016 CLINICAL DATA:  Slurred speech and right facial droop. EXAM: CT HEAD WITHOUT CONTRAST TECHNIQUE: Contiguous axial images were obtained from the base  of the skull through the vertex without intravenous contrast. COMPARISON:  None. FINDINGS: Examination limited by patient motion and positioning. Brain: Age related cerebral atrophy, ventriculomegaly and periventricular white matter disease. No extra-axial fluid collections are identified. No CT findings for acute hemispheric infarction or intracranial hemorrhage. No mass lesions. The brainstem and cerebellum are normal. Vascular: No worrisome vascular calcifications or hyperdense vessels. Skull: No obvious skull fracture or bone lesion. Sinuses/Orbits: The paranasal sinuses and mastoid air cells are grossly clear. Lobes are grossly intact. Other: No obvious scalp lesion or hematoma. IMPRESSION: Very limited examination due to patient motion. Age related cerebral atrophy, ventriculomegaly and periventricular white matter disease. No obvious acute intracranial process. Electronically Signed   By: Rudie Meyer M.D.   On: 11/17/2016 20:32   Dg Chest Port 1 View  Result Date: 11/24/2016 CLINICAL DATA:  Patient status post right-sided thoracentesis. EXAM: PORTABLE CHEST 1 VIEW COMPARISON:  Chest radiograph 11/21/2016 FINDINGS: Patient is rotated to the right. Stable enlarged cardiac and mediastinal contours status post median sternotomy. Monitoring leads overlie the patient. Interval decrease in size of now small right pleural effusion. Residual right basilar atelectasis. Interstitial opacities throughout the left-greater-than-right lungs. No definite right-sided pneumothorax. IMPRESSION: No definite right-sided pneumothorax. Interval decrease in size of now small right pleural effusion. Interstitial opacities suggestive of edema. Electronically Signed   By: Annia Belt M.D.   On: 11/24/2016 07:40   Dg Chest Port 1 View  Result  Date: 11/21/2016 CLINICAL DATA:  Shortness of breath, vomiting last night question aspiration ; history hypertension, coronary artery disease post CABG, type II diabetes mellitus, atrial fibrillation EXAM: PORTABLE CHEST 1 VIEW COMPARISON:  Portable exam 0653 hours compared to 11/20/2016 FINDINGS: Enlargement of cardiac silhouette post CABG. Atherosclerotic calcification aorta. Mediastinal contours and pulmonary vascularity otherwise normal. Mildly increased RIGHT pleural effusion and atelectasis. Mild scattered interstitial infiltrates likely representing mild pulmonary edema. No pneumothorax. Bones demineralized. IMPRESSION: Mild persistent pulmonary edema with mildly increased RIGHT pleural effusion and atelectasis. Electronically Signed   By: Ulyses Southward M.D.   On: 11/21/2016 07:38   Dg Chest Port 1 View  Result Date: 11/20/2016 CLINICAL DATA:  Shortness of Breath EXAM: PORTABLE CHEST 1 VIEW COMPARISON:  November 19, 2016 FINDINGS: There is interstitial pulmonary edema with right pleural effusion. There is airspace consolidation in the medial right base. There is cardiomegaly with pulmonary venous hypertension. There is aortic atherosclerosis. Patient is status post coronary artery bypass grafting. No evident adenopathy. Bones are osteoporotic. There is an old healed fracture of the right mid clavicle with postoperative change. IMPRESSION: Findings indicative of congestive heart failure, similar to 1 day prior. Consolidation medial right base may represent alveolar edema, although superimposed pneumonia in the medial right base cannot be excluded must be of concern. Both edema and pneumonia may present concurrently. There is aortic atherosclerosis. Patient is status post coronary artery bypass grafting. Aortic Atherosclerosis (ICD10-I70.0). Electronically Signed   By: Bretta Bang III M.D.   On: 11/20/2016 10:28   Dg Chest Port 1 View  Result Date: 11/19/2016 CLINICAL DATA:  Shortness of breath.  EXAM: PORTABLE CHEST 1 VIEW COMPARISON:  Radiograph of November 18, 2016. FINDINGS: Stable cardiomegaly. Status post coronary artery bypass graft. Aortic atherosclerosis. Stable central pulmonary vascular congestion is noted with bilateral diffuse interstitial densities concerning for pulmonary edema. Stable right basilar atelectasis or infiltrate is noted with associated pleural effusion. Bony thorax is unremarkable. IMPRESSION: Aortic atherosclerosis. Stable cardiomegaly and central pulmonary vascular congestion with bilateral pulmonary edema. Stable  right basilar atelectasis or infiltrate is noted with associated pleural effusion. Electronically Signed   By: Lupita Raider, M.D.   On: 11/19/2016 07:40   Dg Chest Port 1 View  Result Date: 11/18/2016 CLINICAL DATA:  Hypoxia EXAM: PORTABLE CHEST 1 VIEW COMPARISON:  11/17/2016 FINDINGS: Prior CABG. There is cardiomegaly with vascular congestion and bilateral airspace opacities, diffuse on the right and perihilar and lower lobe on the left. This most likely reflects asymmetric edema. Possible small right effusion. IMPRESSION: Cardiomegaly with vascular congestion and asymmetric airspace disease, right greater than left, most likely asymmetric edema/ CHF. Possible small right effusion. Electronically Signed   By: Charlett Nose M.D.   On: 11/18/2016 11:04   Dg Chest Portable 1 View  Result Date: 11/17/2016 CLINICAL DATA:  Sepsis. EXAM: PORTABLE CHEST 1 VIEW COMPARISON:  6 stent 1,018 FINDINGS: The heart is mildly enlarged but stable. Stable tortuosity, ectasia and calcification of the thoracic aorta. Stable eventration of the right hemidiaphragm. There is overlying right-sided pleural effusion and right lower lobe atelectasis or infiltrate. No left-sided effusion or infiltrate. IMPRESSION: Right-sided pleural effusion and overlying atelectasis or infiltrate. Mild vascular congestion but no overt pulmonary edema. Electronically Signed   By: Rudie Meyer M.D.    On: 11/17/2016 20:04   Dg Abd Portable 1v  Result Date: 11/20/2016 CLINICAL DATA:  Abdominal pain EXAM: PORTABLE ABDOMEN - 1 VIEW COMPARISON:  08/14/2016 FINDINGS: Scattered large and small bowel gas is noted. No obstructive changes are seen. No free air is noted. Multilevel degenerative changes in the thoracic and lumbar spine are seen. No soft tissue abnormality is noted. IMPRESSION: No acute abnormality noted. Electronically Signed   By: Alcide Clever M.D.   On: 11/20/2016 07:39   Ir Thoracentesis Asp Pleural Space W/img Guide  Result Date: 11/25/2016 INDICATION: Decompensated diastolic heart failure with acute hypoxia. Request is made for diagnostic and therapeutic thoracentesis. EXAM: ULTRASOUND GUIDED DIAGNOSTIC AND THERAPEUTIC THORACENTESIS MEDICATIONS: 1% xylocaine COMPLICATIONS: None immediate. PROCEDURE: An ultrasound guided thoracentesis was thoroughly discussed with the patient and questions answered. The benefits, risks, alternatives and complications were also discussed. The patient understands and wishes to proceed with the procedure. Written consent was obtained. Ultrasound was performed to localize and mark an adequate pocket of fluid in the right chest. The area was then prepped and draped in the normal sterile fashion. 1% xylocaine was used for local anesthesia. Under ultrasound guidance a Safe-T-Centesis catheter was introduced. Thoracentesis was performed. The catheter was removed and a dressing applied. FINDINGS: A total of approximately 1.2 L of serous fluid was removed. Samples were sent to the laboratory as requested by the clinical team. IMPRESSION: Successful ultrasound guided right thoracentesis yielding 1.2 L of pleural fluid. Read by: Barnetta Chapel, PA-C Electronically Signed   By: Irish Lack M.D.   On: 11/25/2016 14:23    Assessment and Plan  ACUTE RESPIRATORY FAILURE WITH HYPOXIA/ACUTE DIASTOLIC CHF-probably from same etiology as was his hospitalization; patient  probably has a right pleural effusion, patient's power of attorney and daughter. She discussed whether she would like [place] to send patient to the hospital or not. She is well versed in her father's situation, in fact better versed then we were because at the hospital she had been told that her father is being sent to the nursing home to die. No mention of such on patient's discharge summary to [place]. Patient's daughter is desired was on for the patient to be completely comfortable.  I told her also that this  is not reversible maybe not even in the hospital but certainly not reversible with the nursing home and again she is fine with nursing home care as long as patient is Comfortable. All patient's medications were DC'd. Patient will have food and drink for comfort as desired. Morphine was written for patient every 2 hours when necessary respiratory distress or anxiety. We'll continue O2, obviously and neb treatments if patient finds that comforting.     Time spent greater than 35 minutes;> 50% of time with patient was spent reviewing records, labs, tests and studies, counseling and developing plan of care  Merrilee Seashore, MD

## 2016-12-08 DEATH — deceased

## 2018-03-03 IMAGING — DX DG CHEST 1V PORT
1 series · 1 of 1 positions shown · non-contrast
Comparison: 07/26/2016

CLINICAL DATA: Dyspnea,  tachypnea and expiratory wheeze.

EXAM:
PORTABLE CHEST 1 VIEW

[chest ap]
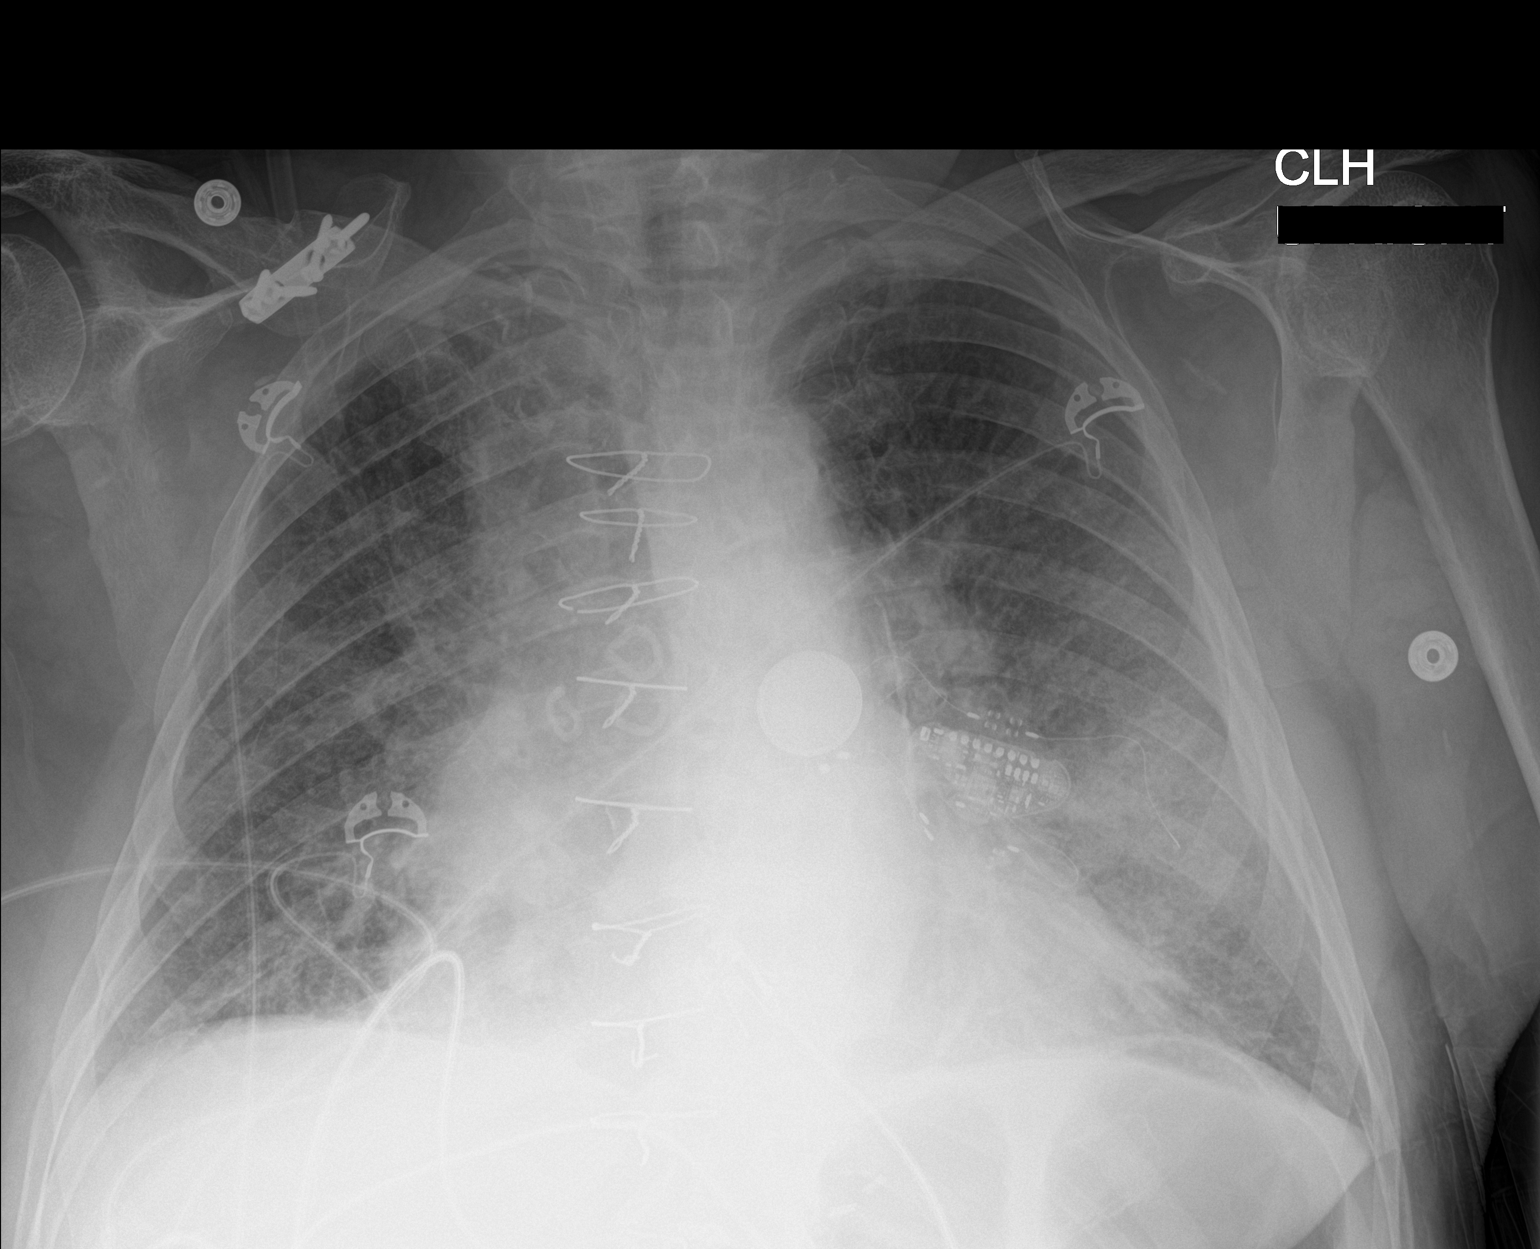

[1 of 1 positions shown; findings below may reference images not displayed]

FINDINGS: Status post CABG with median sternotomy sutures. Stable
cardiomegaly. Atherosclerosis of the aortic arch without aneurysm.
Diffuse interstitial edema with superimposed areas of bilateral
airspace opacity raise concern for CHF. Superimposed pneumonia is
not entirely excluded given smaller focus of confluent airspace
opacity in the left mid lung. There may be a new loop reporting type
device projecting over the left heart border.
IMPRESSION: 1. Cardiomegaly with post CABG change.
2. Aortic atherosclerosis.
3. Mild interstitial pulmonary edema. Superimposed pneumonia
especially in the left mid lung is not entirely excluded though more
likely related to CHF.

## 2018-03-09 IMAGING — DX DG CHEST 2V
3 series · 3 of 3 positions shown · non-contrast
Comparison: 08/11/2016

CLINICAL DATA: CHF

EXAM:
CHEST  2 VIEW

[chest lat]
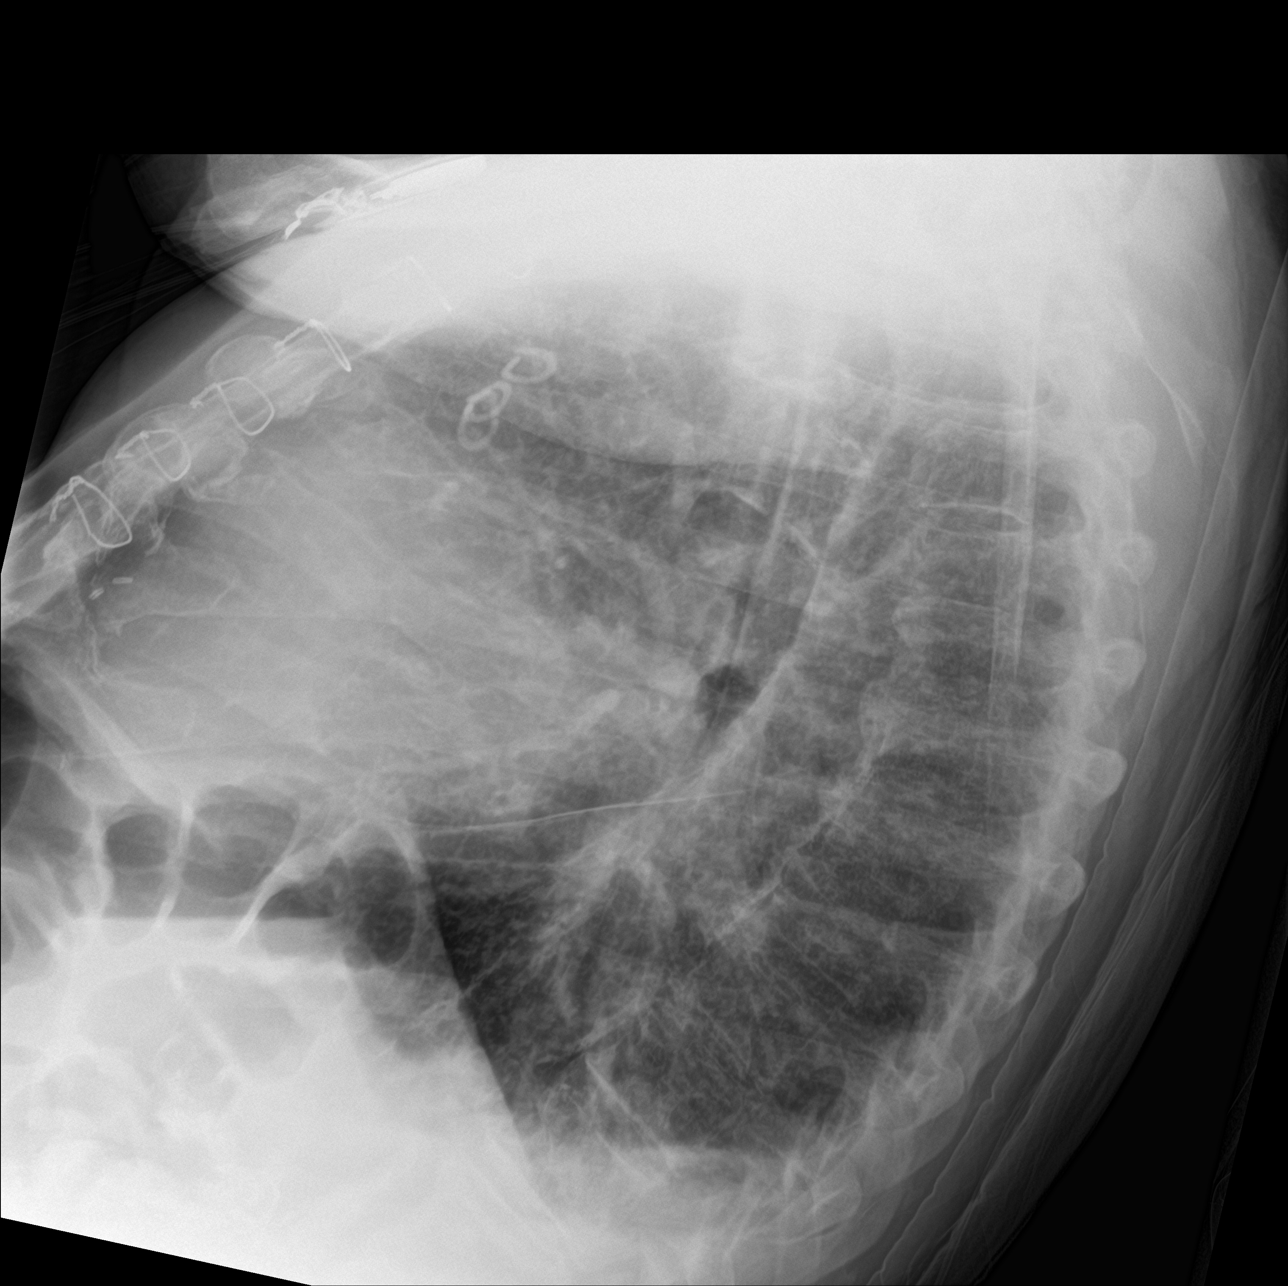

[chest ap (1 of 2)]
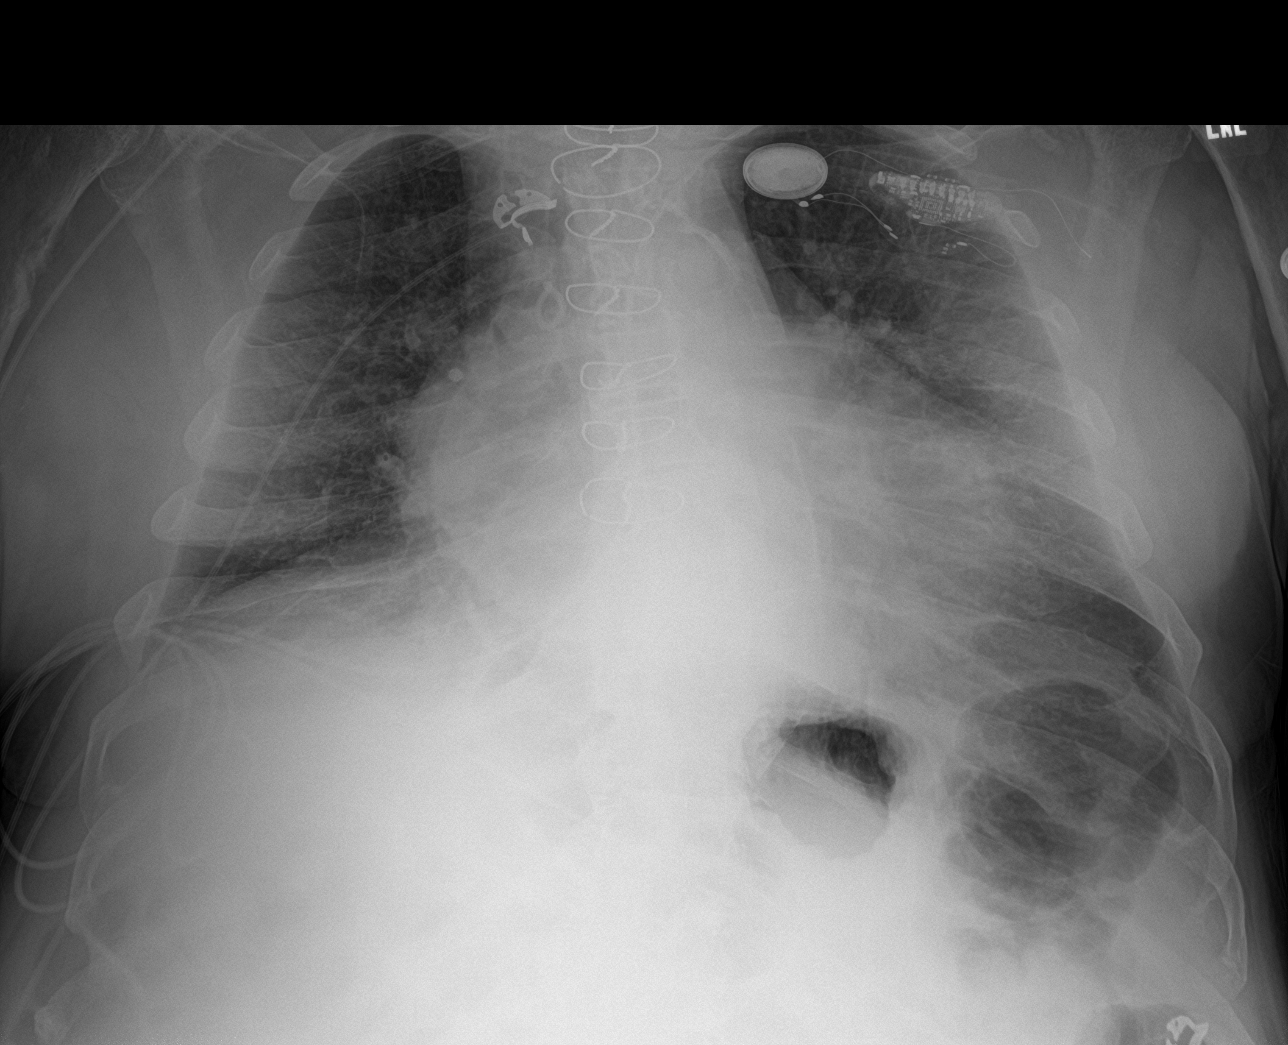

[chest ap (2 of 2)]
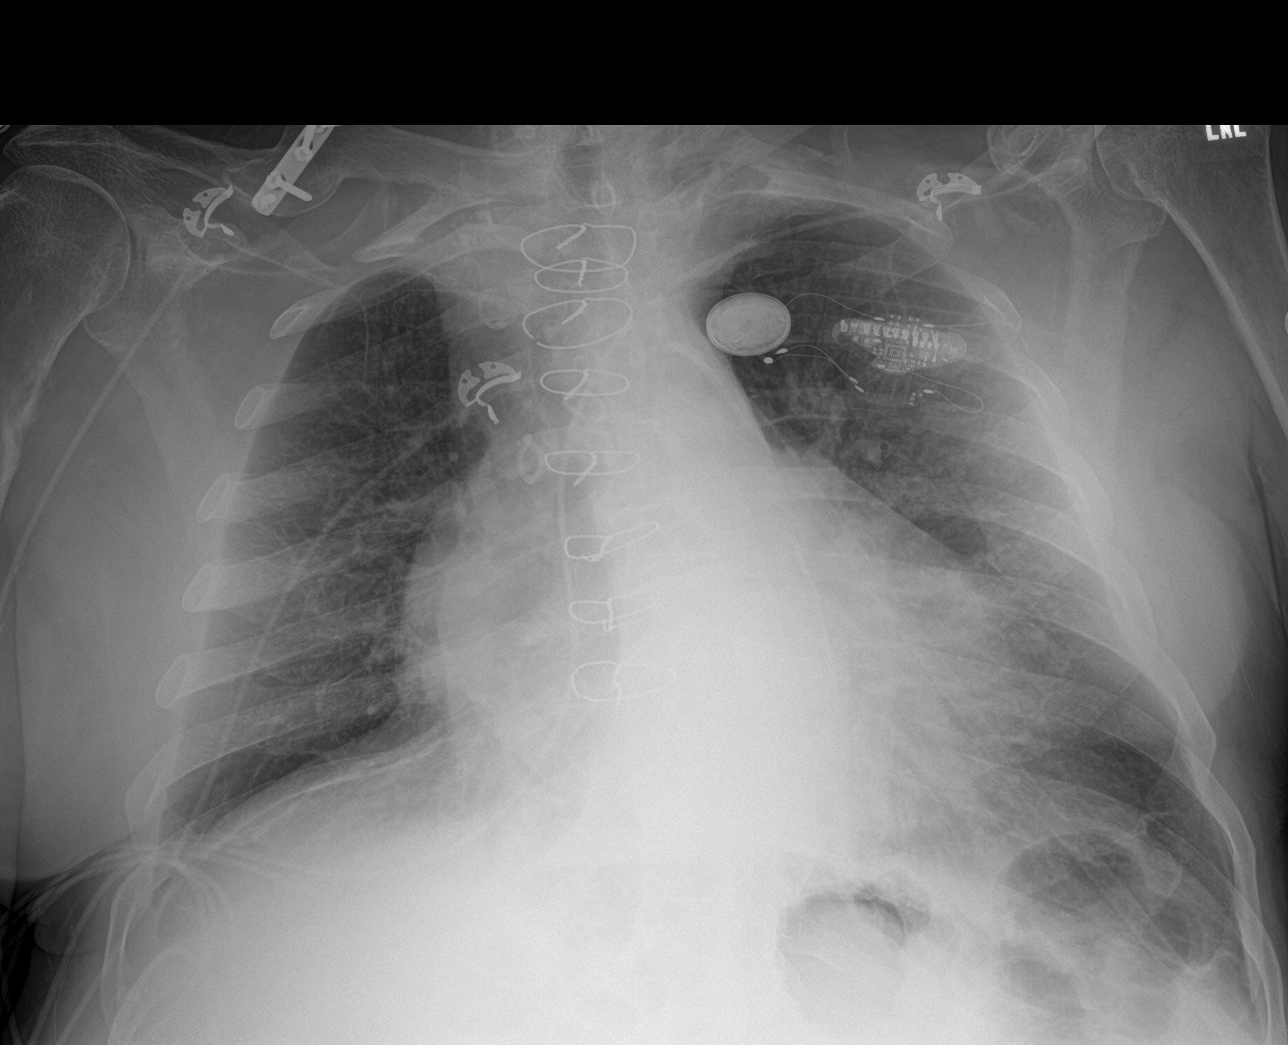

[3 of 3 positions shown; findings below may reference images not displayed]

FINDINGS: Low lung volumes. No focal consolidation or frank interstitial
edema. Suspected small bilateral pleural effusions on the lateral
view.

Cardiomegaly.  Postsurgical changes related to prior CABG.

Median sternotomy. Degenerative changes of the visualized
thoracolumbar spine.
IMPRESSION: Low lung volumes without frank interstitial edema.

Suspected small bilateral pleural effusions.

## 2018-06-10 IMAGING — DX DG CHEST 1V PORT
1 series · 1 of 1 positions shown · non-contrast
Comparison: 11/17/2016

CLINICAL DATA: Hypoxia

EXAM:
PORTABLE CHEST 1 VIEW

[chest ap]
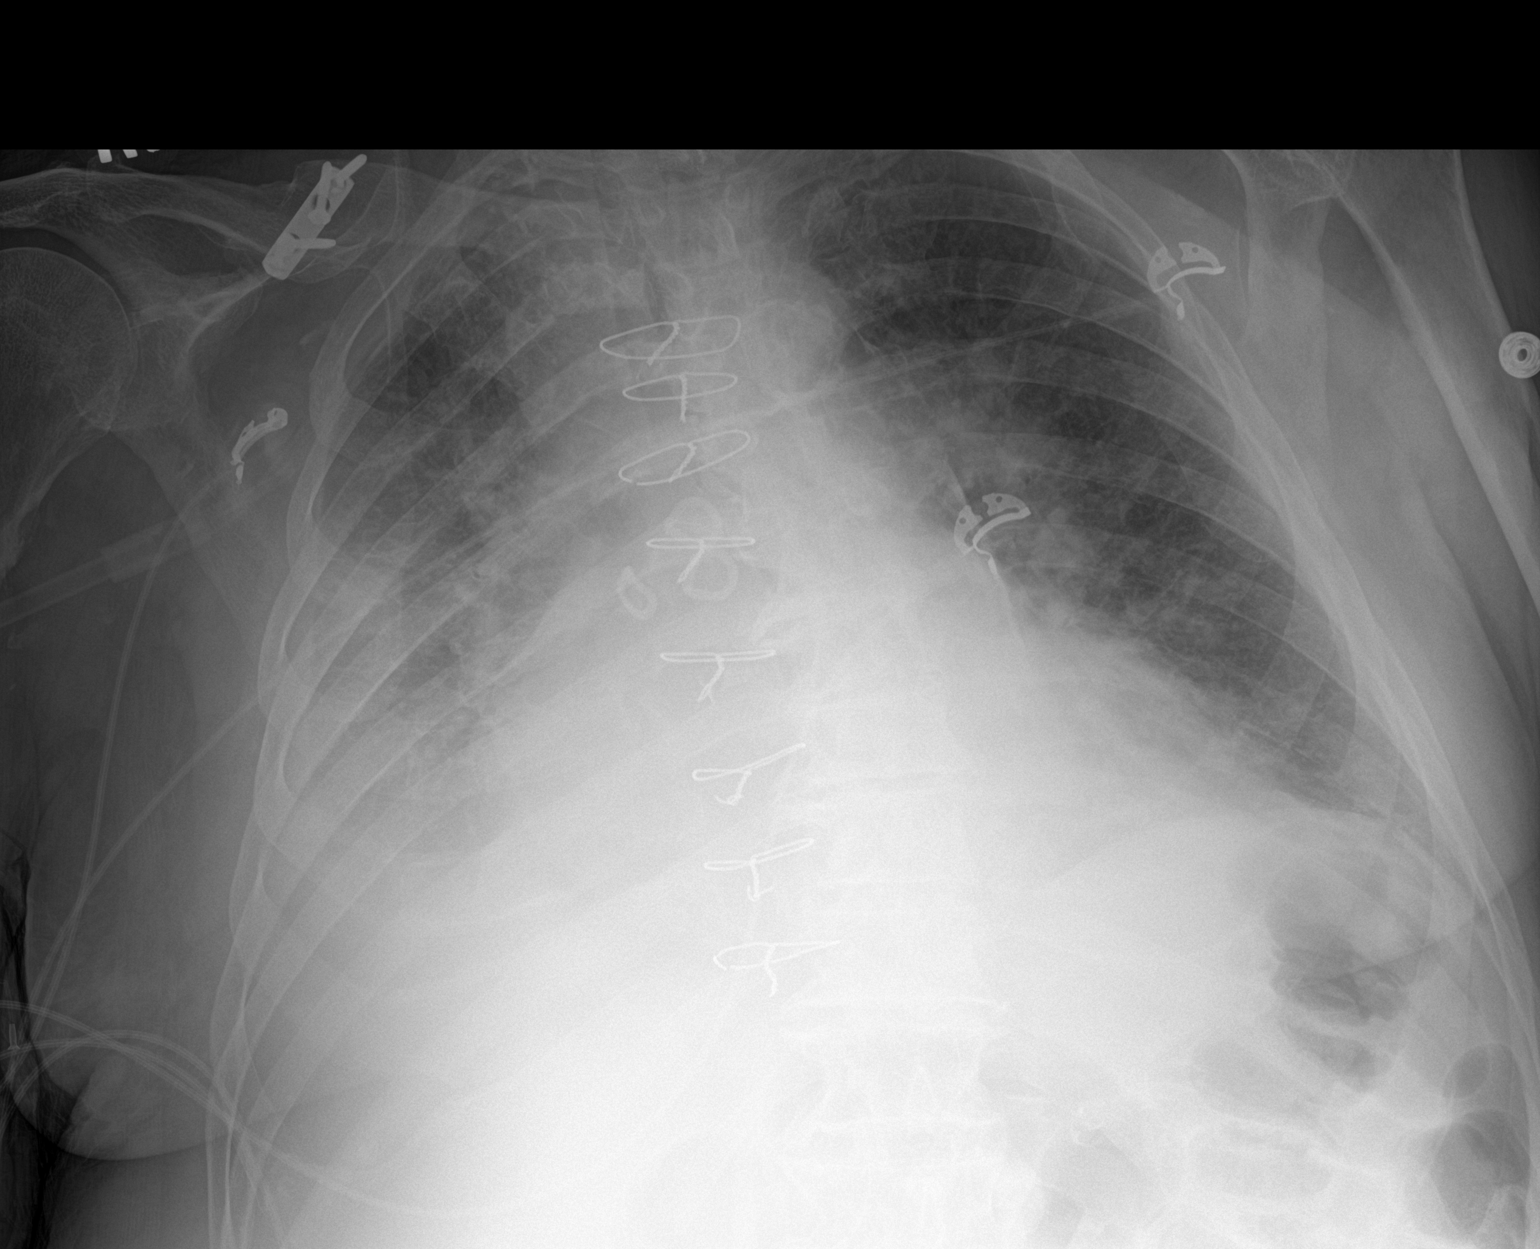

[1 of 1 positions shown; findings below may reference images not displayed]

FINDINGS: Prior CABG. There is cardiomegaly with vascular congestion and
bilateral airspace opacities, diffuse on the right and perihilar and
lower lobe on the left. This most likely reflects asymmetric edema.
Possible small right effusion.
IMPRESSION: Cardiomegaly with vascular congestion and asymmetric airspace
disease, right greater than left, most likely asymmetric edema/ CHF.
Possible small right effusion.
